# Patient Record
Sex: Male | Born: 1945 | Race: White | Hispanic: No | Marital: Married | Smoking: Former smoker
Health system: Southern US, Community
[De-identification: ages and names within clinical notes are randomized; demographics above are authoritative.]

## PROBLEM LIST (undated history)

## (undated) DIAGNOSIS — K219 Gastro-esophageal reflux disease without esophagitis: Secondary | ICD-10-CM

## (undated) DIAGNOSIS — R011 Cardiac murmur, unspecified: Secondary | ICD-10-CM

## (undated) DIAGNOSIS — Z8546 Personal history of malignant neoplasm of prostate: Secondary | ICD-10-CM

## (undated) DIAGNOSIS — I712 Thoracic aortic aneurysm, without rupture: Secondary | ICD-10-CM

## (undated) DIAGNOSIS — I350 Nonrheumatic aortic (valve) stenosis: Secondary | ICD-10-CM

## (undated) DIAGNOSIS — T4145XA Adverse effect of unspecified anesthetic, initial encounter: Secondary | ICD-10-CM

## (undated) DIAGNOSIS — R001 Bradycardia, unspecified: Secondary | ICD-10-CM

## (undated) DIAGNOSIS — I2510 Atherosclerotic heart disease of native coronary artery without angina pectoris: Secondary | ICD-10-CM

## (undated) DIAGNOSIS — R29898 Other symptoms and signs involving the musculoskeletal system: Secondary | ICD-10-CM

## (undated) DIAGNOSIS — N1831 Chronic kidney disease, stage 3a: Secondary | ICD-10-CM

## (undated) DIAGNOSIS — R0600 Dyspnea, unspecified: Secondary | ICD-10-CM

## (undated) DIAGNOSIS — I700 Atherosclerosis of aorta: Secondary | ICD-10-CM

## (undated) DIAGNOSIS — K760 Fatty (change of) liver, not elsewhere classified: Secondary | ICD-10-CM

## (undated) DIAGNOSIS — I10 Essential (primary) hypertension: Secondary | ICD-10-CM

## (undated) DIAGNOSIS — I7121 Aneurysm of the ascending aorta, without rupture: Secondary | ICD-10-CM

## (undated) DIAGNOSIS — Z8719 Personal history of other diseases of the digestive system: Secondary | ICD-10-CM

## (undated) DIAGNOSIS — Z87820 Personal history of traumatic brain injury: Secondary | ICD-10-CM

## (undated) DIAGNOSIS — Z974 Presence of external hearing-aid: Secondary | ICD-10-CM

## (undated) DIAGNOSIS — C801 Malignant (primary) neoplasm, unspecified: Secondary | ICD-10-CM

## (undated) DIAGNOSIS — G629 Polyneuropathy, unspecified: Secondary | ICD-10-CM

## (undated) DIAGNOSIS — Z972 Presence of dental prosthetic device (complete) (partial): Secondary | ICD-10-CM

## (undated) DIAGNOSIS — Z952 Presence of prosthetic heart valve: Secondary | ICD-10-CM

## (undated) DIAGNOSIS — M1990 Unspecified osteoarthritis, unspecified site: Secondary | ICD-10-CM

## (undated) DIAGNOSIS — M5126 Other intervertebral disc displacement, lumbar region: Secondary | ICD-10-CM

## (undated) DIAGNOSIS — I5189 Other ill-defined heart diseases: Secondary | ICD-10-CM

## (undated) DIAGNOSIS — M51369 Other intervertebral disc degeneration, lumbar region without mention of lumbar back pain or lower extremity pain: Secondary | ICD-10-CM

## (undated) DIAGNOSIS — M5136 Other intervertebral disc degeneration, lumbar region: Secondary | ICD-10-CM

## (undated) HISTORY — DX: Nonrheumatic aortic (valve) stenosis: I35.0

## (undated) HISTORY — PX: CARDIAC CATHETERIZATION: SHX172

## (undated) HISTORY — PX: TONSILLECTOMY: SUR1361

## (undated) HISTORY — DX: Gastro-esophageal reflux disease without esophagitis: K21.9

## (undated) HISTORY — PX: EYE SURGERY: SHX253

## (undated) HISTORY — DX: Unspecified osteoarthritis, unspecified site: M19.90

## (undated) HISTORY — PX: KNEE ARTHROSCOPY W/ MENISCAL REPAIR: SHX1877

## (undated) HISTORY — DX: Cardiac murmur, unspecified: R01.1

## (undated) HISTORY — PX: TONSILLECTOMY AND ADENOIDECTOMY: SHX28

## (undated) HISTORY — PX: COLONOSCOPY: SHX174

## (undated) HISTORY — DX: Polyneuropathy, unspecified: G62.9

## (undated) HISTORY — DX: Personal history of traumatic brain injury: Z87.820

---
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
## 2007-02-24 ENCOUNTER — Ambulatory Visit: Payer: Self-pay | Admitting: Orthopaedic Surgery

## 2007-03-22 ENCOUNTER — Ambulatory Visit: Payer: Self-pay | Admitting: Orthopaedic Surgery

## 2013-03-23 HISTORY — PX: CATARACT EXTRACTION: SUR2

## 2014-07-22 ENCOUNTER — Other Ambulatory Visit: Payer: Self-pay | Admitting: Family Medicine

## 2014-07-24 ENCOUNTER — Other Ambulatory Visit: Payer: Self-pay | Admitting: Family Medicine

## 2014-07-24 DIAGNOSIS — R011 Cardiac murmur, unspecified: Secondary | ICD-10-CM

## 2014-07-27 ENCOUNTER — Other Ambulatory Visit: Payer: Self-pay

## 2014-07-27 ENCOUNTER — Ambulatory Visit: Payer: BLUE CROSS/BLUE SHIELD | Attending: Family Medicine

## 2014-07-27 DIAGNOSIS — I350 Nonrheumatic aortic (valve) stenosis: Secondary | ICD-10-CM | POA: Diagnosis not present

## 2014-07-27 DIAGNOSIS — R011 Cardiac murmur, unspecified: Secondary | ICD-10-CM

## 2014-07-27 DIAGNOSIS — I38 Endocarditis, valve unspecified: Secondary | ICD-10-CM | POA: Diagnosis present

## 2015-03-21 DIAGNOSIS — G629 Polyneuropathy, unspecified: Secondary | ICD-10-CM | POA: Insufficient documentation

## 2015-03-21 DIAGNOSIS — I350 Nonrheumatic aortic (valve) stenosis: Secondary | ICD-10-CM | POA: Insufficient documentation

## 2015-03-21 DIAGNOSIS — R011 Cardiac murmur, unspecified: Secondary | ICD-10-CM | POA: Insufficient documentation

## 2015-03-27 ENCOUNTER — Encounter: Payer: Self-pay | Admitting: Family Medicine

## 2015-04-03 ENCOUNTER — Encounter: Payer: Self-pay | Admitting: Family Medicine

## 2015-04-03 ENCOUNTER — Ambulatory Visit (INDEPENDENT_AMBULATORY_CARE_PROVIDER_SITE_OTHER): Payer: BLUE CROSS/BLUE SHIELD | Admitting: Family Medicine

## 2015-04-03 VITALS — BP 125/57 | HR 63 | Temp 98.8°F | Ht 67.75 in | Wt 197.0 lb

## 2015-04-03 DIAGNOSIS — Z1159 Encounter for screening for other viral diseases: Secondary | ICD-10-CM | POA: Diagnosis not present

## 2015-04-03 DIAGNOSIS — Z0000 Encounter for general adult medical examination without abnormal findings: Secondary | ICD-10-CM | POA: Diagnosis not present

## 2015-04-03 DIAGNOSIS — Z1211 Encounter for screening for malignant neoplasm of colon: Secondary | ICD-10-CM | POA: Diagnosis not present

## 2015-04-03 DIAGNOSIS — I350 Nonrheumatic aortic (valve) stenosis: Secondary | ICD-10-CM | POA: Diagnosis not present

## 2015-04-03 DIAGNOSIS — R1901 Right upper quadrant abdominal swelling, mass and lump: Secondary | ICD-10-CM

## 2015-04-03 DIAGNOSIS — Z125 Encounter for screening for malignant neoplasm of prostate: Secondary | ICD-10-CM

## 2015-04-03 DIAGNOSIS — Z23 Encounter for immunization: Secondary | ICD-10-CM | POA: Diagnosis not present

## 2015-04-03 DIAGNOSIS — Z114 Encounter for screening for human immunodeficiency virus [HIV]: Secondary | ICD-10-CM

## 2015-04-03 DIAGNOSIS — Z136 Encounter for screening for cardiovascular disorders: Secondary | ICD-10-CM

## 2015-04-03 NOTE — Patient Instructions (Addendum)
Start back on 81 mg coated aspirin once a day for heart protection Limit eggs to no more than three per week Try to limit saturated fats in your diet (bologna, hot dogs, barbeque, cheeseburgers, hamburgers, steak, bacon, sausage, cheese, etc.) and get more fresh fruits, vegetables, and whole grains We'll have you see the cardiologist about your heart murmur We'll get a scan of your abdomen We'll get some blood tests today We'll get an ultrasound to rule-out an aneurysm (for men who have ever smoked) We'll refer you for a colonoscopy If you have not heard anything from my staff in a week about any orders/referrals/studies from today, please contact us here to follow-up [phone] Return in one year for your next physical or Medicare visit when due

## 2015-04-03 NOTE — Assessment & Plan Note (Signed)
Refer back to cardiologist 

## 2015-04-03 NOTE — Progress Notes (Signed)
Patient ID: BURT NAREZ, male   DOB: [birth year], 70 y.o.   MRN: [id number]  Subjective:   AMYR WASZAK is a 70 y.o. male here for a complete physical exam  Interim issues since last visit: he retired the 15th of February; goes on [place] in February, does not go into effect until February 15th Has knot in the abdomen; he never went to get the xray; bump has now moved from the xiphoid area to the RUQ; hurts when bending forward; no nausea; appetite is good; no change in stools, no blood in stool; no jaundice  USPSTF grade A and B recommendations Alcohol: one beer a week Depression:  Depression screen [facility] 2/9 04/03/2015  Decreased Interest 0  Down, Depressed, Hopeless 0  PHQ - 2 Score 0   Hypertension: controlled today Obesity: lost 6 pounds Tobacco use: smoked in the service, quit more than 25-30 years ago HIV, hep B, hep C: he is willing to get checked STD testing and prevention (chl/gon/syphilis): asymptomatic Lipids: check today Glucose: check today (last thing to eat or drink was 5:45 am), otherwise just water Colorectal cancer: last colonoscopy many years ago Breast cancer: no lumps in the breasts, not sure of any fam hx of breast cancer Lung cancer: quit smoking more than 25-30 years ago Osteoporosis: low risk AAA: ordered US Aspirin: doesn't take aspirin now; he got cut at work, nurse told him she couldn't stop it from bleeding and told him to cut back on that Diet: eats breakfast at country kitchen, eggs, sausage gravy, hash browns with onions, coffee, sliced tomatoes, toast Exercise: pretty active Skin cancer: moles on the neck, just came up, had to quit wearing chain around the neck  Past Medical History  Diagnosis Date  . Neuropathy ([place])   . Aortic stenosis   . Cardiac murmur   . Sleep apnea   . Reflux   . Arthritis   . History of concussion    Past Surgical History  Procedure Laterality Date  . Knee arthroscopy w/ meniscal repair Right   . Cataract  extraction Left 2015  . Tonsillectomy and adenoidectomy     Family History  Problem Relation Age of Onset  . Heart disease Mother   . Stroke Mother   . Diabetes Mother     high blood sugar  . Hypertension Mother   . Cancer Father   . Arthritis Father   . Cancer Brother     throat  . Diabetes Brother   . Lung disease Brother     asbestos   Social History  Substance Use Topics  . Smoking status: Never Smoker   . Smokeless tobacco: Never Used  . Alcohol Use: 0.6 oz/week    1 Cans of beer per week     Comment: 1 beer a week   Review of Systems  Constitutional: Negative for fever, chills and unexpected weight change.  Respiratory: Positive for shortness of breath (only if he exercises or runs a lot).    Objective:   Filed Vitals:   04/03/15 1356  BP: 125/57  Pulse: 63  Temp: 98.8 F (37.1 C)  Height: 5' 7.75" (1.721 m)  Weight: 197 lb (89.359 kg)  SpO2: 96%   Body mass index is 30.17 kg/(m^2). Wt Readings from Last 3 Encounters:  04/03/15 197 lb (89.359 kg)  08/15/14 203 lb (92.08 kg)   Physical Exam  Constitutional: He appears well-developed and well-nourished. No distress.  Obese  HENT:  Head: Normocephalic and atraumatic.  Nose: Nose normal.  Mouth/Throat: Oropharynx is clear and moist.  Eyes: EOM are normal. No scleral icterus.  Neck: No JVD present. Carotid bruit is not present. No thyromegaly present.  Cardiovascular: Normal rate and regular rhythm.  Exam reveals no gallop.   Murmur heard.  Systolic murmur is present with a grade of 4/6  Murmur heard loudest over RUSB  Pulmonary/Chest: Effort normal and breath sounds normal. No respiratory distress. He has no wheezes. He has no rales. He exhibits deformity (barrel-chested (from years of weight-lifting, says pt)). He exhibits no tenderness.  Abdominal: Soft. Bowel sounds are normal. He exhibits mass (protrusion over right upper quadrant, not consistent with xiphoid process). He exhibits no distension.  There is no tenderness. There is no guarding.  Musculoskeletal: Normal range of motion. He exhibits no edema.  Lymphadenopathy:    He has no cervical adenopathy.  Neurological: He is alert. He displays normal reflexes. He exhibits normal muscle tone. Coordination normal.  Skin: Skin is warm and dry. No rash noted. He is not diaphoretic. No erythema. No pallor.  Psychiatric: He has a normal mood and affect. His behavior is normal. Judgment and thought content normal. Cognition and memory are normal.    Assessment/Plan:   Problem List Items Addressed This Visit      Cardiovascular and Mediastinum   Aortic stenosis    Refer back to cardiologist      Relevant Orders   Ambulatory referral to Cardiology     Other   Colon cancer screening    Refer for colonoscopy      Relevant Orders   Ambulatory referral to Gastroenterology   Screening for AAA (abdominal aortic aneurysm)    Order one-time [place] to evaluate for AAA      Relevant Orders   [place] [employer]   Preventative health care - Primary    USPSTF grade A and B recommendations reviewed with patient; age-appropriate recommendations, preventive care, screening tests, etc discussed and encouraged; healthy living encouraged; see AVS for patient education given to patient      Relevant Orders   Lipid Panel w/o Chol/HDL Ratio (Completed)   Comprehensive metabolic panel (Completed)    Other Visit Diagnoses    Needs flu shot        flu vaccine given    Encounter for immunization        Need for hepatitis C screening test        one-time hep C screen ordered    Relevant Orders    Hepatitis C antibody (Completed)    Encounter for screening for HIV        one-time HIV screen ordered    Relevant Orders    HIV antibody (Completed)    Abdominal wall mass of right upper quadrant        CT scan of abdomen; some deformity of thorax (from years of weight-lifting says pt), but this is new    Relevant Orders    CT Abdomen W  Contrast    Prostate cancer screening        discussed controversy, USPSTF vs AUA screening; pt opted for PSA    Relevant Orders    PSA (Completed)       Meds ordered this encounter  Medications  . vitamin B-12 (CYANOCOBALAMIN) 1000 MCG tablet    Sig: Take 1,000 mcg by mouth daily.   Follow up plan: Return in about 1 year (around 04/02/2016) for complete physical.  An after-visit summary was printed and given to  the patient at [place].  Please see the patient instructions which may contain other information and recommendations beyond what is mentioned above in the assessment and plan.  Orders Placed This Encounter  Procedures  . [place] [facility]  . CT Abdomen W Contrast  . Flu Vaccine QUAD 36+ mos IM  . HIV antibody  . Hepatitis C antibody  . Lipid Panel w/o Chol/HDL Ratio  . Comprehensive metabolic panel  . PSA  . Ambulatory referral to Cardiology  . Ambulatory referral to Gastroenterology

## 2015-04-04 LAB — COMPREHENSIVE METABOLIC PANEL
ALBUMIN: 4.1 g/dL (ref 3.5–4.8)
ALT: 13 IU/L (ref 0–44)
AST: 21 IU/L (ref 0–40)
Albumin/Globulin Ratio: 1.6 (ref 1.1–2.5)
Alkaline Phosphatase: 64 IU/L (ref 39–117)
BILIRUBIN TOTAL: 0.4 mg/dL (ref 0.0–1.2)
BUN / CREAT RATIO: 10 (ref 10–22)
BUN: 13 mg/dL (ref 8–27)
CO2: 26 mmol/L (ref 18–29)
CREATININE: 1.27 mg/dL (ref 0.76–1.27)
Calcium: 9.3 mg/dL (ref 8.6–10.2)
Chloride: 104 mmol/L (ref 96–106)
GFR calc non Af Amer: 57 mL/min/{1.73_m2} — ABNORMAL LOW (ref >59)
GFR, EST AFRICAN AMERICAN: 66 mL/min/{1.73_m2} (ref >59)
GLOBULIN, TOTAL: 2.6 g/dL (ref 1.5–4.5)
Glucose: 92 mg/dL (ref 65–99)
Potassium: 4.7 mmol/L (ref 3.5–5.2)
SODIUM: 145 mmol/L — AB (ref 134–144)
TOTAL PROTEIN: 6.7 g/dL (ref 6.0–8.5)

## 2015-04-04 LAB — LIPID PANEL W/O CHOL/HDL RATIO
CHOLESTEROL TOTAL: 185 mg/dL (ref 100–199)
HDL: 52 mg/dL (ref >39)
LDL CALC: 120 mg/dL — AB (ref 0–99)
TRIGLYCERIDES: 67 mg/dL (ref 0–149)
VLDL Cholesterol Cal: 13 mg/dL (ref 5–40)

## 2015-04-04 LAB — PSA: Prostate Specific Ag, Serum: 5.1 ng/mL — ABNORMAL HIGH (ref 0.0–4.0)

## 2015-04-04 LAB — HEPATITIS C ANTIBODY: Hep C Virus Ab: <0.1 s/co ratio (ref 0.0–0.9)

## 2015-04-04 LAB — HIV ANTIBODY (ROUTINE TESTING W REFLEX): HIV Screen 4th Generation wRfx: NONREACTIVE

## 2015-04-06 ENCOUNTER — Telehealth: Payer: Self-pay | Admitting: Family Medicine

## 2015-04-06 DIAGNOSIS — E7800 Pure hypercholesterolemia, unspecified: Secondary | ICD-10-CM | POA: Insufficient documentation

## 2015-04-06 DIAGNOSIS — R972 Elevated prostate specific antigen [PSA]: Secondary | ICD-10-CM | POA: Insufficient documentation

## 2015-04-06 NOTE — Telephone Encounter (Signed)
I talked with patient He'll try more oatmeal instead of sausage and gravy and eggs Recheck lipids in 3 months PSA is elevated; he'd like to go in mid-February, missed work already because of snow, so would like to wait a month, after 15th Feb

## 2015-04-08 ENCOUNTER — Telehealth: Payer: Self-pay | Admitting: Family Medicine

## 2015-04-08 DIAGNOSIS — Z136 Encounter for screening for cardiovascular disorders: Secondary | ICD-10-CM | POA: Insufficient documentation

## 2015-04-08 NOTE — Telephone Encounter (Signed)
done

## 2015-04-08 NOTE — Telephone Encounter (Signed)
-----   Message from Kenyon Ana sent at 04/08/2015 11:40 AM EST ----- Regarding: please correct order Please correct order for the intial medicare screening since patient has no other imaging studies about the aorta and they are male between 33-75 and has smoked in there lifetime.  [id number]) US aorta initial medicare screen.  Please make sure you keep that he has smoked many years ago on the order. Thank you and have a great day.

## 2015-04-09 DIAGNOSIS — Z0000 Encounter for general adult medical examination without abnormal findings: Secondary | ICD-10-CM | POA: Insufficient documentation

## 2015-04-09 NOTE — Assessment & Plan Note (Signed)
USPSTF grade A and B recommendations reviewed with patient; age-appropriate recommendations, preventive care, screening tests, etc discussed and encouraged; healthy living encouraged; see AVS for patient education given to patient  

## 2015-04-09 NOTE — Assessment & Plan Note (Signed)
Order one-time [place] to evaluate for AAA

## 2015-04-09 NOTE — Assessment & Plan Note (Signed)
Refer for colonoscopy 

## 2015-04-18 ENCOUNTER — Other Ambulatory Visit: Payer: Self-pay | Admitting: Family Medicine

## 2015-04-18 DIAGNOSIS — Z87891 Personal history of nicotine dependence: Secondary | ICD-10-CM

## 2015-04-18 DIAGNOSIS — Z136 Encounter for screening for cardiovascular disorders: Secondary | ICD-10-CM

## 2015-04-19 ENCOUNTER — Ambulatory Visit
Admission: RE | Admit: 2015-04-19 | Discharge: 2015-04-19 | Disposition: A | Discharge status: Home / Self Care | Payer: BLUE CROSS/BLUE SHIELD | Source: Ambulatory Visit | Attending: Family Medicine | Admitting: Family Medicine

## 2015-04-19 ENCOUNTER — Telehealth: Payer: Self-pay | Admitting: Family Medicine

## 2015-04-19 DIAGNOSIS — I517 Cardiomegaly: Secondary | ICD-10-CM | POA: Insufficient documentation

## 2015-04-19 DIAGNOSIS — S32030S Wedge compression fracture of third lumbar vertebra, sequela: Secondary | ICD-10-CM

## 2015-04-19 DIAGNOSIS — Z720 Tobacco use: Secondary | ICD-10-CM | POA: Insufficient documentation

## 2015-04-19 DIAGNOSIS — Z136 Encounter for screening for cardiovascular disorders: Secondary | ICD-10-CM | POA: Insufficient documentation

## 2015-04-19 DIAGNOSIS — I700 Atherosclerosis of aorta: Secondary | ICD-10-CM | POA: Insufficient documentation

## 2015-04-19 DIAGNOSIS — I77811 Abdominal aortic ectasia: Secondary | ICD-10-CM

## 2015-04-19 DIAGNOSIS — I2510 Atherosclerotic heart disease of native coronary artery without angina pectoris: Secondary | ICD-10-CM | POA: Diagnosis not present

## 2015-04-19 DIAGNOSIS — R1901 Right upper quadrant abdominal swelling, mass and lump: Secondary | ICD-10-CM | POA: Insufficient documentation

## 2015-04-19 DIAGNOSIS — K760 Fatty (change of) liver, not elsewhere classified: Secondary | ICD-10-CM | POA: Insufficient documentation

## 2015-04-19 DIAGNOSIS — N281 Cyst of kidney, acquired: Secondary | ICD-10-CM | POA: Insufficient documentation

## 2015-04-19 DIAGNOSIS — M4856XA Collapsed vertebra, not elsewhere classified, lumbar region, initial encounter for fracture: Secondary | ICD-10-CM | POA: Diagnosis not present

## 2015-04-19 DIAGNOSIS — I350 Nonrheumatic aortic (valve) stenosis: Secondary | ICD-10-CM

## 2015-04-19 DIAGNOSIS — M51369 Other intervertebral disc degeneration, lumbar region without mention of lumbar back pain or lower extremity pain: Secondary | ICD-10-CM

## 2015-04-19 DIAGNOSIS — M5126 Other intervertebral disc displacement, lumbar region: Secondary | ICD-10-CM

## 2015-04-19 DIAGNOSIS — R972 Elevated prostate specific antigen [PSA]: Secondary | ICD-10-CM

## 2015-04-19 DIAGNOSIS — M5136 Other intervertebral disc degeneration, lumbar region: Secondary | ICD-10-CM

## 2015-04-19 MED ORDER — IOHEXOL 300 MG/ML  SOLN
100.0000 mL | Freq: Once | INTRAMUSCULAR | Status: AC | PRN
  Administered 2015-04-19: 100 mL via INTRAVENOUS

## 2015-04-19 MED ORDER — ATORVASTATIN CALCIUM 20 MG PO TABS: 20.0000 mg | ORAL_TABLET | Freq: Every day | ORAL | Status: DC

## 2015-04-19 NOTE — Telephone Encounter (Signed)
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

## 2015-04-19 NOTE — Telephone Encounter (Signed)
Abd [place] and CT scan reports are back; call with results

## 2015-04-20 ENCOUNTER — Telehealth: Payer: Self-pay | Admitting: Family Medicine

## 2015-04-20 DIAGNOSIS — K760 Fatty (change of) liver, not elsewhere classified: Secondary | ICD-10-CM | POA: Insufficient documentation

## 2015-04-20 DIAGNOSIS — I517 Cardiomegaly: Secondary | ICD-10-CM | POA: Insufficient documentation

## 2015-04-20 DIAGNOSIS — I2510 Atherosclerotic heart disease of native coronary artery without angina pectoris: Secondary | ICD-10-CM | POA: Insufficient documentation

## 2015-04-20 DIAGNOSIS — M5126 Other intervertebral disc displacement, lumbar region: Secondary | ICD-10-CM | POA: Insufficient documentation

## 2015-04-20 DIAGNOSIS — I77811 Abdominal aortic ectasia: Secondary | ICD-10-CM | POA: Insufficient documentation

## 2015-04-20 DIAGNOSIS — N281 Cyst of kidney, acquired: Secondary | ICD-10-CM | POA: Insufficient documentation

## 2015-04-20 DIAGNOSIS — S32030A Wedge compression fracture of third lumbar vertebra, initial encounter for closed fracture: Secondary | ICD-10-CM | POA: Insufficient documentation

## 2015-04-20 DIAGNOSIS — M5136 Other intervertebral disc degeneration, lumbar region: Secondary | ICD-10-CM | POA: Insufficient documentation

## 2015-04-20 NOTE — Telephone Encounter (Signed)
New information for cardiology referral Please let cardiologist's office know that he also has coronary artery disease noted in RCA, would appreciate they evaluate that as well as the aortic stenosis Thank you

## 2015-04-20 NOTE — Telephone Encounter (Signed)
Update problem list with the following: Aortic US IMPRESSION: Mild prominence of the proximal abdominal aorta without aneurysm. The aorta tapers distally. Ectatic abdominal aorta at risk for aneurysm development. Recommend followup by ultrasound in 5 years. CT scan 1. The only candidate area for the palpable right upper quadrant lump is mild asymmetric prominence of the right lower serratus anterior muscle. 2. 50% central compression fracture at L3 without bony retropulsion, appears chronic. 3. Suspected central narrowing of the thecal sac at L4-5 due to disc bulge and ligamentum flavum redundancy. 4. Mild cardiomegaly. Aortic valve calcification and right coronary artery atherosclerotic calcification. 5. Diffuse hepatic steatosis. 6. Bilateral renal cysts.

## 2015-04-24 ENCOUNTER — Telehealth: Payer: Self-pay

## 2015-04-24 ENCOUNTER — Other Ambulatory Visit: Payer: Self-pay

## 2015-04-24 NOTE — Telephone Encounter (Signed)
Gastroenterology Pre-Procedure Review  Request Date: 05/17/15 Requesting Physician: Dr. Sanda Klein  PATIENT REVIEW QUESTIONS: The patient responded to the following health history questions as indicated:    1. Are you having any GI issues? no 2. Do you have a personal history of Polyps? no 3. Do you have a family history of Colon Cancer or Polyps? no 4. Diabetes Mellitus? no 5. Joint replacements in the past 12 months?no 6. Major health problems in the past 3 months?no 7. Any artificial heart valves, MVP, or defibrillator?no    MEDICATIONS & ALLERGIES:    Patient reports the following regarding taking any anticoagulation/antiplatelet therapy:   Plavix, Coumadin, Eliquis, Xarelto, Lovenox, Pradaxa, Brilinta, or Effient? no Aspirin? no  Patient confirms/reports the following medications:  Current Outpatient Prescriptions  Medication Sig Dispense Refill  . atorvastatin (LIPITOR) 20 MG tablet Take 1 tablet (20 mg total) by mouth at bedtime. 30 tablet 1  . vitamin B-12 (CYANOCOBALAMIN) 1000 MCG tablet Take 1,000 mcg by mouth daily.     No current facility-administered medications for this visit.    Patient confirms/reports the following allergies:  Allergies  Allergen Reactions  . Vicodin [Hydrocodone-Acetaminophen]     No orders of the defined types were placed in this encounter.    AUTHORIZATION INFORMATION Primary Insurance: 1D#: Group #:  Secondary Insurance: 1D#: Group #:  SCHEDULE INFORMATION: Date: 05/17/15 Time: Location: [place]

## 2015-04-26 NOTE — Telephone Encounter (Signed)
Keri, If you could please see the note below, address and document, I would appreciate it Thank you

## 2015-05-02 NOTE — Telephone Encounter (Signed)
In-basket messaged Dr. Rogue Jury regarding new information on Diagnosis. Dr.Muhammad is apart of [facility]. Patient's appointment is 06/04/2015

## 2015-05-10 ENCOUNTER — Encounter: Payer: Self-pay | Admitting: *Deleted

## 2015-05-16 NOTE — Discharge Instructions (Signed)

## 2015-05-17 ENCOUNTER — Ambulatory Visit: Payer: Medicare HMO | Admitting: Anesthesiology

## 2015-05-17 ENCOUNTER — Ambulatory Visit
Admission: RE | Admit: 2015-05-17 | Discharge: 2015-05-17 | Disposition: A | Discharge status: Home / Self Care | Payer: Medicare HMO | Source: Ambulatory Visit | Attending: Gastroenterology | Admitting: Gastroenterology

## 2015-05-17 ENCOUNTER — Encounter
Admission: RE | Disposition: A | Discharge status: Home / Self Care | Payer: Self-pay | Source: Ambulatory Visit | Attending: Gastroenterology

## 2015-05-17 DIAGNOSIS — K219 Gastro-esophageal reflux disease without esophagitis: Secondary | ICD-10-CM | POA: Insufficient documentation

## 2015-05-17 DIAGNOSIS — M19042 Primary osteoarthritis, left hand: Secondary | ICD-10-CM | POA: Diagnosis not present

## 2015-05-17 DIAGNOSIS — M1990 Unspecified osteoarthritis, unspecified site: Secondary | ICD-10-CM | POA: Insufficient documentation

## 2015-05-17 DIAGNOSIS — Z8261 Family history of arthritis: Secondary | ICD-10-CM | POA: Insufficient documentation

## 2015-05-17 DIAGNOSIS — R008 Other abnormalities of heart beat: Secondary | ICD-10-CM | POA: Insufficient documentation

## 2015-05-17 DIAGNOSIS — Z79899 Other long term (current) drug therapy: Secondary | ICD-10-CM | POA: Diagnosis not present

## 2015-05-17 DIAGNOSIS — Z87891 Personal history of nicotine dependence: Secondary | ICD-10-CM | POA: Insufficient documentation

## 2015-05-17 DIAGNOSIS — Z1211 Encounter for screening for malignant neoplasm of colon: Secondary | ICD-10-CM | POA: Diagnosis present

## 2015-05-17 DIAGNOSIS — E538 Deficiency of other specified B group vitamins: Secondary | ICD-10-CM | POA: Insufficient documentation

## 2015-05-17 DIAGNOSIS — M170 Bilateral primary osteoarthritis of knee: Secondary | ICD-10-CM | POA: Insufficient documentation

## 2015-05-17 DIAGNOSIS — Z9889 Other specified postprocedural states: Secondary | ICD-10-CM | POA: Diagnosis not present

## 2015-05-17 DIAGNOSIS — R001 Bradycardia, unspecified: Secondary | ICD-10-CM | POA: Diagnosis not present

## 2015-05-17 DIAGNOSIS — I493 Ventricular premature depolarization: Secondary | ICD-10-CM | POA: Insufficient documentation

## 2015-05-17 DIAGNOSIS — M19041 Primary osteoarthritis, right hand: Secondary | ICD-10-CM | POA: Insufficient documentation

## 2015-05-17 DIAGNOSIS — Z538 Procedure and treatment not carried out for other reasons: Secondary | ICD-10-CM | POA: Insufficient documentation

## 2015-05-17 DIAGNOSIS — I350 Nonrheumatic aortic (valve) stenosis: Secondary | ICD-10-CM | POA: Insufficient documentation

## 2015-05-17 DIAGNOSIS — Z8249 Family history of ischemic heart disease and other diseases of the circulatory system: Secondary | ICD-10-CM | POA: Diagnosis not present

## 2015-05-17 DIAGNOSIS — G4730 Sleep apnea, unspecified: Secondary | ICD-10-CM | POA: Diagnosis not present

## 2015-05-17 DIAGNOSIS — Z7982 Long term (current) use of aspirin: Secondary | ICD-10-CM | POA: Insufficient documentation

## 2015-05-17 DIAGNOSIS — Z800 Family history of malignant neoplasm of digestive organs: Secondary | ICD-10-CM | POA: Insufficient documentation

## 2015-05-17 DIAGNOSIS — Z9842 Cataract extraction status, left eye: Secondary | ICD-10-CM | POA: Insufficient documentation

## 2015-05-17 DIAGNOSIS — Z885 Allergy status to narcotic agent status: Secondary | ICD-10-CM | POA: Diagnosis not present

## 2015-05-17 DIAGNOSIS — Z825 Family history of asthma and other chronic lower respiratory diseases: Secondary | ICD-10-CM | POA: Diagnosis not present

## 2015-05-17 DIAGNOSIS — Z833 Family history of diabetes mellitus: Secondary | ICD-10-CM | POA: Diagnosis not present

## 2015-05-17 DIAGNOSIS — Z823 Family history of stroke: Secondary | ICD-10-CM | POA: Diagnosis not present

## 2015-05-17 HISTORY — DX: Other symptoms and signs involving the musculoskeletal system: R29.898

## 2015-05-17 HISTORY — DX: Presence of dental prosthetic device (complete) (partial): Z97.2

## 2015-05-17 HISTORY — DX: Other intervertebral disc degeneration, lumbar region: M51.36

## 2015-05-17 HISTORY — DX: Other intervertebral disc displacement, lumbar region: M51.26

## 2015-05-17 HISTORY — DX: Other intervertebral disc degeneration, lumbar region without mention of lumbar back pain or lower extremity pain: M51.369

## 2015-05-17 HISTORY — PX: COLONOSCOPY WITH PROPOFOL: SHX5780

## 2015-05-17 SURGERY — COLONOSCOPY WITH PROPOFOL
Anesthesia: Monitor Anesthesia Care

## 2015-05-17 MED ORDER — FENTANYL CITRATE (PF) 100 MCG/2ML IJ SOLN: 25.0000 ug | INTRAMUSCULAR | Status: DC | PRN

## 2015-05-17 MED ORDER — LACTATED RINGERS IV SOLN
INTRAVENOUS | Status: DC
  Administered 2015-05-17: 10:00:00 via INTRAVENOUS

## 2015-05-17 SURGICAL SUPPLY — 28 items

## 2015-05-17 NOTE — Anesthesia Preprocedure Evaluation (Signed)
Anesthesia Evaluation  Patient identified by MRN, date of birth, ID band Patient awake    Reviewed: Allergy & Precautions, NPO status , Patient's Chart, lab work & pertinent test results  Airway Mallampati: II  TM Distance: >3 FB Neck ROM: Full    Dental no notable dental hx. (+) Edentulous Upper, Edentulous Lower   Pulmonary neg pulmonary ROS, sleep apnea , former smoker,    Pulmonary exam normal breath sounds clear to auscultation       Cardiovascular Exercise Tolerance: Good METS: Normal cardiovascular exam Rhythm:Regular Rate:Normal  Pt is able to climb trees, work hard in the yard, hunt.  Pt has moderate aortic stenosis, but excellent exercise tolerance.   Neuro/Psych negative neurological ROS  negative psych ROS   GI/Hepatic negative GI ROS, Neg liver ROS,   Endo/Other  negative endocrine ROS  Renal/GU negative Renal ROS  negative genitourinary   Musculoskeletal negative musculoskeletal ROS (+) Arthritis ,   Abdominal   Peds negative pediatric ROS (+)  Hematology negative hematology ROS (+)   Anesthesia Other Findings   Reproductive/Obstetrics negative OB ROS                             Anesthesia Physical Anesthesia Plan  ASA: II  Anesthesia Plan: MAC   Post-op Pain Management:    Induction: Intravenous  Airway Management Planned:   Additional Equipment:   Intra-op Plan:   Post-operative Plan: Extubation in OR  Informed Consent: I have reviewed the patients History and Physical, chart, labs and discussed the procedure including the risks, benefits and alternatives for the proposed anesthesia with the patient or authorized representative who has indicated his/her understanding and acceptance.   Dental advisory given  Plan Discussed with: CRNA  Anesthesia Plan Comments:         Anesthesia Quick Evaluation

## 2015-05-17 NOTE — Transfer of Care (Signed)
Immediate Anesthesia Transfer of Care Note  Patient: Gregory Black  Procedure(s) Performed: Procedure(s): COLONOSCOPY WITH PROPOFOL (N/A)  Patient Location: PACU  Anesthesia Type: MAC  Level of Consciousness: awake, alert  and patient cooperative  Airway and Oxygen Therapy: Patient Spontanous Breathing and Patient connected to supplemental oxygen  Post-op Assessment: Post-op Vital signs reviewed, Patient's Cardiovascular Status Stable, Respiratory Function Stable, Patent Airway and No signs of Nausea or vomiting  Post-op Vital Signs: Reviewed and stable  Complications: No apparent anesthesia complications

## 2015-05-17 NOTE — Anesthesia Postprocedure Evaluation (Signed)
Anesthesia Post Note  Patient: Gregory Black  Procedure(s) Performed: Procedure(s) (LRB): COLONOSCOPY WITH PROPOFOL (N/A)  Patient location during evaluation: PACU Anesthesia Type: General Level of consciousness: awake and alert Pain management: pain level controlled Vital Signs Assessment: post-procedure vital signs reviewed and stable Respiratory status: spontaneous breathing, nonlabored ventilation, respiratory function stable and patient connected to nasal cannula oxygen Cardiovascular status: blood pressure returned to baseline and stable Postop Assessment: no signs of nausea or vomiting Anesthetic complications: no Comments: Pt with known moderate Aortic stenosis and good functional capacity.  However, upon placement in lateral position, there were frequent PVCs, bigeminy, and severe bradycardia with P waves for each complex.  There were significant pauses.  The lowest heart 40's.  Pt was placed on his back and is currently in sinus rhythm with heart rate approximately 60.  The patient feels fine, "no different that normal."  Given the arrhythmia present and the aortic stenosis, we Dr. Allen Norris and I elected to cancel the case.  The patient received no medication.  The patient is doing well and does not require admission to a hospital since he is in his normal condition.  He does have a cardiology appointment upcoming which he will attend.     Salvatrice Morandi C

## 2015-05-17 NOTE — Anesthesia Procedure Notes (Signed)
Procedure Name: MAC Performed by: Kyrra Prada Pre-anesthesia Checklist: Patient identified, Emergency Drugs available, Suction available, Timeout performed and Patient being monitored Patient Re-evaluated:Patient Re-evaluated prior to inductionOxygen Delivery Method: Nasal cannula Placement Confirmation: positive ETCO2     

## 2015-05-17 NOTE — H&P (Signed)
[facility]  [address] [place], [place] [address] Phone: [phone] Fax : [phone]  Primary Care Physician:  Enid Derry, MD Primary Gastroenterologist:  Dr. Allen Norris  Pre-Procedure History & Physical: HPI:  Gregory Black is a 70 y.o. male is here for a screening colonoscopy.   Past Medical History  Diagnosis Date  . Neuropathy ([place])   . Aortic stenosis 07/27/14    Echo   . Cardiac murmur   . Reflux     in past  . History of concussion   . Sleep apnea     pt denies  . Bulging lumbar disc   . Arthritis     hands, knees  . Weakness of both legs     pt says from Vit b12 deficiency  . Wears dentures     full upper and lower    Past Surgical History  Procedure Laterality Date  . Knee arthroscopy w/ meniscal repair Right   . Cataract extraction Left 2015  . Tonsillectomy and adenoidectomy    . Colonoscopy      Prior to Admission medications   Medication Sig Start Date End Date Taking? Authorizing Provider  aspirin 81 MG tablet Take 81 mg by mouth daily.   Yes Historical Provider, MD  atorvastatin (LIPITOR) 20 MG tablet Take 1 tablet (20 mg total) by mouth at bedtime. 04/19/15  Yes Arnetha Courser, MD  vitamin B-12 (CYANOCOBALAMIN) 1000 MCG tablet Take 1,000 mcg by mouth daily.   Yes Historical Provider, MD    Allergies as of 04/24/2015 - Review Complete 04/19/2015  Allergen Reaction Noted  . Vicodin [hydrocodone-acetaminophen]  03/21/2015    Family History  Problem Relation Age of Onset  . Heart disease Mother   . Stroke Mother   . Diabetes Mother     high blood sugar  . Hypertension Mother   . Cancer Father   . Arthritis Father   . Cancer Brother     throat  . Diabetes Brother   . Lung disease Brother     asbestos    Social History   Social History  . Marital Status: Married    Spouse Name: N/A  . Number of Children: N/A  . Years of Education: N/A   Occupational History  . Not on file.   Social History Main Topics    . Smoking status: Former Research scientist (life sciences)  . Smokeless tobacco: Never Used     Comment: quit approx 2010  . Alcohol Use: 0.6 oz/week    1 Cans of beer per week     Comment: 1 beer a week  . Drug Use: No  . Sexual Activity: Not on file   Other Topics Concern  . Not on file   Social History Narrative    Review of Systems: See HPI, otherwise negative ROS  Physical Exam: BP 157/84 mmHg  Pulse 54  Temp(Src) 97.7 F (36.5 C)  Resp 16  Ht 5\' 7"  (1.702 m)  Wt 194 lb (87.998 kg)  BMI 30.38 kg/m2  SpO2 100% General:   Alert,  pleasant and cooperative in NAD Head:  Normocephalic and atraumatic. Neck:  Supple; no masses or thyromegaly. Lungs:  Clear throughout to auscultation.    Heart:  Regular rate and rhythm. Abdomen:  Soft, nontender and nondistended. Normal bowel sounds, without guarding, and without rebound.   Neurologic:  Alert and  oriented x4;  grossly normal neurologically.  Impression/Plan: [name] is now here to undergo a screening colonoscopy.  Risks, benefits, and alternatives regarding colonoscopy have been reviewed with the patient.  Questions have been answered.  All parties agreeable.

## 2015-05-20 ENCOUNTER — Encounter: Payer: Self-pay | Admitting: Gastroenterology

## 2015-05-20 ENCOUNTER — Telehealth: Payer: Self-pay | Admitting: Family Medicine

## 2015-05-20 DIAGNOSIS — R001 Bradycardia, unspecified: Secondary | ICD-10-CM

## 2015-05-20 NOTE — Telephone Encounter (Signed)
He was supposed to have his colonoscopy on Friday but they didn't do it because while attempting to sedate him, he was having low heart rate issues, skipping beats and high BP, so they did not feel comfortable putting him to sleep. GI wants him to see a cardiologist to get clearance before they repeat his colonoscopy. He has an appointment with Dr.Arida, but he wants to go to Dr. Ubaldo Glassing instead.

## 2015-05-20 NOTE — Telephone Encounter (Signed)
Pt has medical questions he didn't want to discuss with me. The only thing he told me is that he needed to speak with Dr Sanda Klein.

## 2015-05-20 NOTE — Telephone Encounter (Signed)
Referral put in now for Dr. Ubaldo Glassing. If we can move forward with getting him set for this that would be great. Thanks!

## 2015-06-03 ENCOUNTER — Telehealth: Payer: Self-pay | Admitting: Family Medicine

## 2015-06-03 NOTE — Telephone Encounter (Signed)
I spoke with patient. He was confused as to why [facility] was calling him in regards to an appointment. I advised him to disreard it, that was from before we knew he wanted to see Dr. Ubaldo Glassing. Patient understood.

## 2015-06-03 NOTE — Telephone Encounter (Signed)
Pt came in stated he has a few medical questions he needs to ask you. Please call pt ASAP. Pt stated it pertains to appt's he has tomorrow. Thanks.

## 2015-06-04 ENCOUNTER — Ambulatory Visit: Payer: BLUE CROSS/BLUE SHIELD | Admitting: Cardiovascular Disease

## 2016-06-03 ENCOUNTER — Other Ambulatory Visit: Payer: Self-pay

## 2016-06-10 ENCOUNTER — Ambulatory Visit: Payer: Medicare HMO | Admitting: Urology

## 2016-06-10 ENCOUNTER — Encounter: Payer: Self-pay | Admitting: Urology

## 2016-06-10 VITALS — BP 174/100 | HR 54 | Ht 65.0 in | Wt 206.3 lb

## 2016-06-10 DIAGNOSIS — R972 Elevated prostate specific antigen [PSA]: Secondary | ICD-10-CM

## 2016-06-10 DIAGNOSIS — N400 Enlarged prostate without lower urinary tract symptoms: Secondary | ICD-10-CM

## 2016-06-10 NOTE — Progress Notes (Signed)
06/10/2016 11:32 AM   Gregory Black [birth year] [id number]  Referring provider: Arnetha Courser, MD [address] [place], [place] [address]  Chief Complaint  Patient presents with  . New Patient (Initial Visit)    elevated PSA    HPI: The patient is a 71 year old gentleman who presents today for evaluation of the PSA of 6.75 which has risen from 5.1 over the last year. The patient does not know what his PSA was prior to his levels. He has no history of prostate biopsy. He has daytime frequency sometimes once per hour, however he is not bothered by this. He has nocturia 2 that also various which again is not bothered by this. He has no history of nephrolithiasis, UTI, or hematuria.  PSA History: 6.75 3/18 5.1   1/17     PMH: Past Medical History:  Diagnosis Date  . Aortic stenosis 07/27/14   Echo   . Arthritis    hands, knees  . Bulging lumbar disc   . Cardiac murmur   . History of concussion   . Neuropathy ([place])   . Reflux    in past  . Sleep apnea    pt denies  . Weakness of both legs    pt says from Vit b12 deficiency  . Wears dentures    full upper and lower    Surgical History: Past Surgical History:  Procedure Laterality Date  . CATARACT EXTRACTION Left 2015  . COLONOSCOPY    . COLONOSCOPY WITH PROPOFOL N/A 05/17/2015   Procedure: COLONOSCOPY WITH PROPOFOL;  Surgeon: Lucilla Lame, MD;  Location: [facility];  Service: Endoscopy;  Laterality: N/A;  . KNEE ARTHROSCOPY W/ MENISCAL REPAIR Right   . TONSILLECTOMY AND ADENOIDECTOMY      Home Medications:  Allergies as of 06/10/2016      Reactions   Pollen Extract    Vicodin [hydrocodone-acetaminophen]    Other reaction(s): Other (See Comments) Unable to void or empty bowels Unable to void or empty bowels      Medication List       Accurate as of 06/10/16 11:32 AM. Always use your most recent med list.          aspirin 81 MG tablet Take 81 mg by mouth daily.   Fish Oil 500 MG  Caps Take by mouth.   multivitamin tablet Take 1 tablet by mouth daily.   vitamin B-12 1000 MCG tablet Commonly known as:  CYANOCOBALAMIN Take 1,000 mcg by mouth daily.       Allergies:  Allergies  Allergen Reactions  . Pollen Extract   . Vicodin [Hydrocodone-Acetaminophen]     Other reaction(s): Other (See Comments) Unable to void or empty bowels Unable to void or empty bowels    Family History: Family History  Problem Relation Age of Onset  . Heart disease Mother   . Stroke Mother   . Diabetes Mother     high blood sugar  . Hypertension Mother   . Cancer Father   . Arthritis Father   . Cancer Brother     throat  . Diabetes Brother   . Lung disease Brother     asbestos    Social History:  reports that he has quit smoking. He has never used smokeless tobacco. He reports that he drinks about 0.6 oz of alcohol per week . He reports that he does not use drugs.  ROS: UROLOGY Frequent Urination?: Yes Hard to postpone urination?: Yes Burning/pain with urination?: No  Get up at night to urinate?: Yes Leakage of urine?: No Urine stream starts and stops?: No Trouble starting stream?: No Do you have to strain to urinate?: No Blood in urine?: No Urinary tract infection?: No Sexually transmitted disease?: No Injury to kidneys or bladder?: No Painful intercourse?: No Weak stream?: No Erection problems?: Yes Penile pain?: No  Gastrointestinal Nausea?: No Vomiting?: No Indigestion/heartburn?: No Diarrhea?: No Constipation?: No  Constitutional Fever: No Night sweats?: No Weight loss?: No Fatigue?: Yes  Skin Skin rash/lesions?: No Itching?: Yes  Eyes Blurred vision?: Yes Double vision?: No  Ears/Nose/Throat Sore throat?: No Sinus problems?: Yes  Hematologic/Lymphatic Swollen glands?: No Easy bruising?: No  Cardiovascular Leg swelling?: Yes Chest pain?: No  Respiratory Cough?: Yes Shortness of breath?: Yes  Endocrine Excessive thirst?:  No  Musculoskeletal Back pain?: Yes Joint pain?: Yes  Neurological Headaches?: No Dizziness?: No  Psychologic Depression?: No Anxiety?: No  Physical Exam: BP (!) 174/100   Pulse (!) 54   Ht 5\' 5"  (1.651 m)   Wt 206 lb 4.8 oz (93.6 kg)   BMI 34.33 kg/m   Constitutional:  Alert and oriented, No acute distress. HEENT: [place] AT, moist mucus membranes.  Trachea midline, no masses. Cardiovascular: No clubbing, cyanosis, or edema. Respiratory: Normal respiratory effort, no increased work of breathing. GI: Abdomen is soft, nontender, nondistended, no abdominal masses GU: No CVA tenderness. Normal phallus. Testicles descended bilaterally benign. DRE: 2+ benign. Skin: No rashes, bruises or suspicious lesions. Lymph: No cervical or inguinal adenopathy. Neurologic: Grossly intact, no focal deficits, moving all 4 extremities. Psychiatric: Normal mood and affect.  Laboratory Data: No results found for: WBC, HGB, HCT, MCV, PLT  Lab Results  Component Value Date   CREATININE 1.27 04/03/2015    No results found for: PSA  No results found for: TESTOSTERONE  No results found for: HGBA1C  Urinalysis No results found for: COLORURINE, APPEARANCEUR, LABSPEC, PHURINE, GLUCOSEU, HGBUR, BILIRUBINUR, KETONESUR, PROTEINUR, UROBILINOGEN, NITRITE, LEUKOCYTESUR   Assessment & Plan:    1. Elevated PSA I discussed the patient the role of PSA screening and detecting prostate cancer. We did discuss his PSA has been elevated on 2 occasions and has actually risen over the last year. We discussed the next step is a prostate biopsy. We discussed the risks, benefits, indications of this procedure. He understands the risks include bleeding and infection. He understands any blood in his stool and urine for 24-48 hours in his semen for after 6 weeks. He also understands the risk for infection despite perioperative antibiotics required hospitalization. All questions were answered. The patient has elected to  proceed.  The patient is going on a cruise of the [place] for approximate 1 month. He leaves and less than 2 weeks. He is requested to hold off his biopsy until he returns. I think this is reasonable.  2. BPH Not bothered by symptoms. No treatment necessary.  Return for prostate  biopsy.  Nickie Retort, MD  [facility] [address] [place], [place] [address] [phone]

## 2016-07-22 ENCOUNTER — Other Ambulatory Visit: Payer: Self-pay | Admitting: Urology

## 2016-07-22 ENCOUNTER — Ambulatory Visit (INDEPENDENT_AMBULATORY_CARE_PROVIDER_SITE_OTHER): Payer: Medicare HMO | Admitting: Urology

## 2016-07-22 ENCOUNTER — Encounter: Payer: Self-pay | Admitting: Urology

## 2016-07-22 VITALS — BP 168/80 | HR 52 | Ht 68.0 in | Wt 208.6 lb

## 2016-07-22 DIAGNOSIS — R972 Elevated prostate specific antigen [PSA]: Secondary | ICD-10-CM

## 2016-07-22 MED ORDER — LEVOFLOXACIN 500 MG PO TABS
500.0000 mg | ORAL_TABLET | Freq: Once | ORAL | Status: AC
  Administered 2016-07-22: 500 mg via ORAL

## 2016-07-22 MED ORDER — GENTAMICIN SULFATE 40 MG/ML IJ SOLN
80.0000 mg | Freq: Once | INTRAMUSCULAR | Status: AC
  Administered 2016-07-22: 80 mg via INTRAMUSCULAR

## 2016-07-22 NOTE — Progress Notes (Signed)
Prostate Biopsy Procedure   Informed consent was obtained after discussing risks/benefits of the procedure.  A time out was performed to ensure correct patient identity.  Pre-Procedure: - Last PSA Level: 6.75 - Gentamicin given prophylactically - Levaquin 500 mg administered PO -Transrectal Ultrasound performed revealing a 43 gm prostate -No significant hypoechoic or median lobe noted  Procedure: - Prostate block performed using 10 cc 1% lidocaine and biopsies taken from sextant areas, a total of 12 under ultrasound guidance.  Post-Procedure: - Patient tolerated the procedure well - He was counseled to seek immediate medical attention if experiences any severe pain, significant bleeding, or fevers - Return in one week to discuss biopsy results

## 2016-07-28 ENCOUNTER — Other Ambulatory Visit: Payer: Self-pay | Admitting: Urology

## 2016-07-28 LAB — PATHOLOGY REPORT

## 2016-07-31 ENCOUNTER — Telehealth: Payer: Self-pay

## 2016-07-31 ENCOUNTER — Ambulatory Visit: Payer: BLUE CROSS/BLUE SHIELD | Admitting: Urology

## 2016-07-31 NOTE — Telephone Encounter (Signed)
Pt was notified of his negative prostate biopsy. He verbal stated understanding. He was scheduled for a 37mth f/u appt w/ PSA prior.

## 2017-01-28 ENCOUNTER — Other Ambulatory Visit: Payer: Medicare HMO

## 2017-01-28 DIAGNOSIS — R972 Elevated prostate specific antigen [PSA]: Secondary | ICD-10-CM

## 2017-01-29 LAB — PSA: PROSTATE SPECIFIC AG, SERUM: 7.4 ng/mL — AB (ref 0.0–4.0)

## 2017-02-04 ENCOUNTER — Ambulatory Visit: Payer: Medicare HMO

## 2017-02-18 ENCOUNTER — Encounter: Payer: Self-pay | Admitting: Urology

## 2017-02-18 ENCOUNTER — Ambulatory Visit (INDEPENDENT_AMBULATORY_CARE_PROVIDER_SITE_OTHER): Payer: Medicare HMO | Admitting: Urology

## 2017-02-18 VITALS — BP 155/82 | HR 60 | Ht 68.0 in | Wt 204.0 lb

## 2017-02-18 DIAGNOSIS — R972 Elevated prostate specific antigen [PSA]: Secondary | ICD-10-CM | POA: Diagnosis not present

## 2017-02-18 NOTE — Progress Notes (Signed)
02/18/2017 8:37 AM   Gregory Black 1945-12-02 [id number]  Referring provider: Kirk Ruths, MD [place] [facility] [place], [place] [address]  Chief Complaint  Patient presents with  . Elevated PSA    HPI: The patient is a 71 year old gentleman for follow-up of elevated PSA.  He does a history of a negative prostate biopsy in May 2018 except for one core of high-grade PIN.  His PSA rose to 7.4 in November  2018 from 6.75 which it was in March 2018 which spurred his prostate biopsy.  Patient with no urological complaints or changes to his health status.  PSA History: 7.4   11/18 6.75 3/18 5.1   1/17        PMH: Past Medical History:  Diagnosis Date  . Aortic stenosis 07/27/14   Echo   . Arthritis    hands, knees  . Bulging lumbar disc   . Cardiac murmur   . History of concussion   . Neuropathy   . Reflux    in past  . Sleep apnea    pt denies  . Weakness of both legs    pt says from Vit b12 deficiency  . Wears dentures    full upper and lower    Surgical History: Past Surgical History:  Procedure Laterality Date  . CATARACT EXTRACTION Left 2015  . COLONOSCOPY    . COLONOSCOPY WITH PROPOFOL N/A 05/17/2015   Procedure: COLONOSCOPY WITH PROPOFOL;  Surgeon: Lucilla Lame, MD;  Location: [facility];  Service: Endoscopy;  Laterality: N/A;  . KNEE ARTHROSCOPY W/ MENISCAL REPAIR Right   . TONSILLECTOMY AND ADENOIDECTOMY      Home Medications:  Allergies as of 02/18/2017      Reactions   Pollen Extract    Vicodin [hydrocodone-acetaminophen]    Other reaction(s): Other (See Comments) Unable to void or empty bowels Unable to void or empty bowels      Medication List        Accurate as of 02/18/17  8:37 AM. Always use your most recent med list.          aspirin 81 MG tablet Take 81 mg by mouth daily.   Fish Oil 500 MG Caps Take by mouth.   multivitamin tablet Take 1 tablet by mouth daily.   vitamin B-12  1000 MCG tablet Commonly known as:  CYANOCOBALAMIN Take 1,000 mcg by mouth daily.       Allergies:  Allergies  Allergen Reactions  . Pollen Extract   . Vicodin [Hydrocodone-Acetaminophen]     Other reaction(s): Other (See Comments) Unable to void or empty bowels Unable to void or empty bowels    Family History: Family History  Problem Relation Age of Onset  . Heart disease Mother   . Stroke Mother   . Diabetes Mother        high blood sugar  . Hypertension Mother   . Cancer Father   . Arthritis Father   . Cancer Brother        throat  . Diabetes Brother   . Lung disease Brother        asbestos    Social History:  reports that he has quit smoking. he has never used smokeless tobacco. He reports that he drinks about 0.6 oz of alcohol per week. He reports that he does not use drugs.  ROS: UROLOGY Frequent Urination?: Yes Hard to postpone urination?: Yes Burning/pain with urination?: No Get up at night  to urinate?: Yes Leakage of urine?: No Urine stream starts and stops?: No Trouble starting stream?: No Do you have to strain to urinate?: No Blood in urine?: No Urinary tract infection?: No Sexually transmitted disease?: No Injury to kidneys or bladder?: No Painful intercourse?: No Weak stream?: No Erection problems?: No Penile pain?: No  Gastrointestinal Nausea?: No Vomiting?: No Indigestion/heartburn?: No Diarrhea?: No Constipation?: No  Constitutional Fever: No Night sweats?: No Weight loss?: No Fatigue?: No  Skin Skin rash/lesions?: No Itching?: Yes  Eyes Blurred vision?: Yes Double vision?: Yes  Ears/Nose/Throat Sore throat?: No Sinus problems?: Yes  Hematologic/Lymphatic Swollen glands?: No Easy bruising?: No  Cardiovascular Leg swelling?: No Chest pain?: No  Respiratory Cough?: No Shortness of breath?: No  Endocrine Excessive thirst?: No  Musculoskeletal Back pain?: No Joint pain?: Yes  Neurological Headaches?:  No Dizziness?: No  Psychologic Depression?: No Anxiety?: No  Physical Exam: BP (!) 155/82 (BP Location: Left Arm, Patient Position: Sitting, Cuff Size: Normal)   Pulse 60   Ht 5\' 8"  (1.727 m)   Wt 204 lb (92.5 kg)   BMI 31.02 kg/m   Constitutional:  Alert and oriented, No acute distress. HEENT: [place] AT, moist mucus membranes.  Trachea midline, no masses. Cardiovascular: No clubbing, cyanosis, or edema. Respiratory: Normal respiratory effort, no increased work of breathing. GI: Abdomen is soft, nontender, nondistended, no abdominal masses GU: No CVA tenderness.  Skin: No rashes, bruises or suspicious lesions. Lymph: No cervical or inguinal adenopathy. Neurologic: Grossly intact, no focal deficits, moving all 4 extremities. Psychiatric: Normal mood and affect.  Laboratory Data: No results found for: WBC, HGB, HCT, MCV, PLT  Lab Results  Component Value Date   CREATININE 1.27 04/03/2015    No results found for: PSA  No results found for: TESTOSTERONE  No results found for: HGBA1C  Urinalysis No results found for: COLORURINE, APPEARANCEUR, LABSPEC, PHURINE, GLUCOSEU, HGBUR, BILIRUBINUR, KETONESUR, PROTEINUR, UROBILINOGEN, NITRITE, LEUKOCYTESUR    Assessment & Plan:    1.  Elevated PSA I discussed with the patient that his PSA continues to rise despite his negative prostate biopsy 6 months ago.  I discussed options with him which include continue to monitor with PSA,  prostate biopsy, and MRI of prostate.  We discussed the risks and benefits of each.  At this point, the patient has elected to pursue a prostate MRI.  He will follow-up with the results.  Return for after prostate MRI.  Nickie Retort, MD  [facility] [address], [place], [place] [address] [phone]

## 2017-02-22 ENCOUNTER — Telehealth: Payer: Self-pay | Admitting: Urology

## 2017-02-22 NOTE — Telephone Encounter (Signed)
Dr. Pilar Jarvis ordered a prostate MRI and he wanted him it done @ [facility] imaging. Order was faxed to them @ [phone] and they will call the patient with an app.  Sharyn Lull

## 2017-02-23 ENCOUNTER — Other Ambulatory Visit: Payer: Self-pay | Admitting: Urology

## 2017-02-23 DIAGNOSIS — R972 Elevated prostate specific antigen [PSA]: Secondary | ICD-10-CM

## 2017-03-08 ENCOUNTER — Ambulatory Visit
Admission: RE | Admit: 2017-03-08 | Discharge: 2017-03-08 | Disposition: A | Discharge status: Home / Self Care | Payer: Medicare HMO | Source: Ambulatory Visit | Attending: Urology | Admitting: Urology

## 2017-03-08 DIAGNOSIS — R972 Elevated prostate specific antigen [PSA]: Secondary | ICD-10-CM

## 2017-03-08 MED ORDER — GADOBENATE DIMEGLUMINE 529 MG/ML IV SOLN: 20.0000 mL | Freq: Once | INTRAVENOUS | Status: DC | PRN

## 2017-03-11 ENCOUNTER — Ambulatory Visit (INDEPENDENT_AMBULATORY_CARE_PROVIDER_SITE_OTHER): Payer: Medicare HMO | Admitting: Urology

## 2017-03-11 ENCOUNTER — Encounter: Payer: Self-pay | Admitting: Urology

## 2017-03-11 VITALS — BP 120/74 | HR 54 | Ht 68.0 in | Wt 203.9 lb

## 2017-03-11 DIAGNOSIS — R972 Elevated prostate specific antigen [PSA]: Secondary | ICD-10-CM | POA: Diagnosis not present

## 2017-03-11 NOTE — Progress Notes (Signed)
03/11/2017 10:11 AM   Gregory Black 1945/04/21 [id number]  Referring provider: Kirk Ruths, MD [place] [facility] [place], [place] [address]  Chief Complaint  Patient presents with  . Elevated PSA    MRI results    HPI: The patient is a 71 year old gentleman for follow-up of elevated PSA.  He does a history of a negative prostate biopsy in May 2018 except for one core of high-grade PIN.  His PSA rose to 7.4 in November 2018 from 6.75 which it was in March 2018 which spurred his prostate biopsy.  Patient with no urological complaints or changes to his health status.  PSA History: 7.4   11/18 6.75 3/18 5.1 1/17  Due to his PSA rising, he underwent a prostate MRI.  This did show a 1.2 cm lesion at the right apical transition zone that was PI-RADS 3 lesion.   PMH: Past Medical History:  Diagnosis Date  . Aortic stenosis 07/27/14   Echo   . Arthritis    hands, knees  . Bulging lumbar disc   . Cardiac murmur   . History of concussion   . Neuropathy   . Reflux    in past  . Sleep apnea    pt denies  . Weakness of both legs    pt says from Vit b12 deficiency  . Wears dentures    full upper and lower    Surgical History: Past Surgical History:  Procedure Laterality Date  . CATARACT EXTRACTION Left 2015  . COLONOSCOPY    . COLONOSCOPY WITH PROPOFOL N/A 05/17/2015   Procedure: COLONOSCOPY WITH PROPOFOL;  Surgeon: Lucilla Lame, MD;  Location: [facility];  Service: Endoscopy;  Laterality: N/A;  . KNEE ARTHROSCOPY W/ MENISCAL REPAIR Right   . TONSILLECTOMY AND ADENOIDECTOMY      Home Medications:  Allergies as of 03/11/2017      Reactions   Pollen Extract    Vicodin [hydrocodone-acetaminophen]    Other reaction(s): Other (See Comments) Unable to void or empty bowels Unable to void or empty bowels      Medication List        Accurate as of 03/11/17 10:11 AM. Always use your most recent med list.            aspirin 81 MG tablet Take 81 mg by mouth daily.   Fish Oil 500 MG Caps Take by mouth.   multivitamin tablet Take 1 tablet by mouth daily.   vitamin B-12 1000 MCG tablet Commonly known as:  CYANOCOBALAMIN Take 1,000 mcg by mouth daily.       Allergies:  Allergies  Allergen Reactions  . Pollen Extract   . Vicodin [Hydrocodone-Acetaminophen]     Other reaction(s): Other (See Comments) Unable to void or empty bowels Unable to void or empty bowels    Family History: Family History  Problem Relation Age of Onset  . Heart disease Mother   . Stroke Mother   . Diabetes Mother        high blood sugar  . Hypertension Mother   . Cancer Father   . Arthritis Father   . Cancer Brother        throat  . Diabetes Brother   . Lung disease Brother        asbestos    Social History:  reports that he has quit smoking. he has never used smokeless tobacco. He reports that he drinks about 0.6 oz of alcohol per week.  He reports that he does not use drugs.  ROS: UROLOGY Frequent Urination?: Yes Hard to postpone urination?: Yes Burning/pain with urination?: No Get up at night to urinate?: Yes Leakage of urine?: No Urine stream starts and stops?: Yes Trouble starting stream?: No Do you have to strain to urinate?: No Blood in urine?: No Urinary tract infection?: No Sexually transmitted disease?: No Injury to kidneys or bladder?: No Painful intercourse?: No Weak stream?: No Erection problems?: No Penile pain?: No  Gastrointestinal Nausea?: No Vomiting?: No Indigestion/heartburn?: No Diarrhea?: No Constipation?: No  Constitutional Fever: No Night sweats?: No Weight loss?: No Fatigue?: No  Skin Skin rash/lesions?: No Itching?: No  Eyes Blurred vision?: Yes Double vision?: No  Ears/Nose/Throat Sore throat?: No Sinus problems?: No  Hematologic/Lymphatic Swollen glands?: No Easy bruising?: No  Cardiovascular Leg swelling?: No Chest pain?:  No  Respiratory Cough?: No Shortness of breath?: No  Endocrine Excessive thirst?: No  Musculoskeletal Back pain?: No Joint pain?: No  Neurological Headaches?: No Dizziness?: No  Psychologic Depression?: No Anxiety?: No  Physical Exam: BP 120/74 (BP Location: Right Arm, Patient Position: Sitting, Cuff Size: Normal)   Pulse (!) 54   Ht 5\' 8"  (1.727 m)   Wt 203 lb 14.4 oz (92.5 kg)   BMI 31.00 kg/m   Constitutional:  Alert and oriented, No acute distress. HEENT: [place] AT, moist mucus membranes.  Trachea midline, no masses. Cardiovascular: No clubbing, cyanosis, or edema. Respiratory: Normal respiratory effort, no increased work of breathing. GI: Abdomen is soft, nontender, nondistended, no abdominal masses GU: No CVA tenderness.  Skin: No rashes, bruises or suspicious lesions. Lymph: No cervical or inguinal adenopathy. Neurologic: Grossly intact, no focal deficits, moving all 4 extremities. Psychiatric: Normal mood and affect.  Laboratory Data: No results found for: WBC, HGB, HCT, MCV, PLT  Lab Results  Component Value Date   CREATININE 1.27 04/03/2015    No results found for: PSA  No results found for: TESTOSTERONE  No results found for: HGBA1C  Urinalysis No results found for: COLORURINE, APPEARANCEUR, LABSPEC, PHURINE, GLUCOSEU, HGBUR, BILIRUBINUR, KETONESUR, PROTEINUR, UROBILINOGEN, NITRITE, LEUKOCYTESUR   Assessment & Plan:    1.  Elevated PSA I discussed the patient that he has indeterminate PI-RADS 3 lesion on his prostate MRI.  We did discuss that this could potentially represent higher risk malignancy.  At this point, I have recommended he undergo a fusion biopsy to target this area in particular as well as repeat his previous biopsy due to his rising PSA and negative biopsy in May 2018.  We did discuss that this would be performed at [facility] urology in [place] as we do not have equipment for the fusion imaging in this office.  He will follow-up back  at [facility] for results.   Nickie Retort, MD  [facility] [address] [place], [place] [address] [phone]

## 2017-04-22 ENCOUNTER — Other Ambulatory Visit: Payer: Self-pay | Admitting: Urology

## 2017-04-22 ENCOUNTER — Ambulatory Visit: Payer: Medicare HMO

## 2017-04-22 ENCOUNTER — Ambulatory Visit (INDEPENDENT_AMBULATORY_CARE_PROVIDER_SITE_OTHER): Payer: Medicare HMO | Admitting: Urology

## 2017-04-22 ENCOUNTER — Encounter: Payer: Self-pay | Admitting: Urology

## 2017-04-22 VITALS — BP 145/81 | HR 52 | Ht 69.0 in | Wt 196.0 lb

## 2017-04-22 DIAGNOSIS — C61 Malignant neoplasm of prostate: Secondary | ICD-10-CM | POA: Diagnosis not present

## 2017-04-22 NOTE — Progress Notes (Signed)
04/22/2017 11:45 AM   Gregory Black 1945-10-20 [id number]  Referring provider: Kirk Ruths, MD [facility] [place], [place] [address]  Chief Complaint  Patient presents with  . Prostate Cancer    Fusion Biopsy results  . Results    HPI: The patient is a 72 year old gentlemanfor follow-up fusion prostate biopsy.Marland Kitchen He does a history of a negative prostate biopsy in May 2018 except for one core of high-grade PIN. His PSA rose to 7.4 in November2018 from 6.75 which it was in March 2018 which spurred his prostate biopsy.   PSA History: 7.4 11/18 6.75 3/18 5.1 1/17  Due to his PSA rising, he underwent a prostate MRI.  This did show a 1.2 cm lesion at the right apical transition zone that was PI-RADS 3 lesion.    He underwent a MRI fusion biopsy as result of this.  The region of interest showed Gleason 3+4 = 7 prostate cancer in approximately 30% of histologically evaluated tissue.  He also had 4 of 12 cores positive for Gleason 3+3 = 6 on the right side.     PMH: Past Medical History:  Diagnosis Date  . Aortic stenosis 07/27/14   Echo   . Arthritis    hands, knees  . Bulging lumbar disc   . Cardiac murmur   . History of concussion   . Neuropathy   . Reflux    in past  . Sleep apnea    pt denies  . Weakness of both legs    pt says from Vit b12 deficiency  . Wears dentures    full upper and lower    Surgical History: Past Surgical History:  Procedure Laterality Date  . CATARACT EXTRACTION Left 2015  . COLONOSCOPY    . COLONOSCOPY WITH PROPOFOL N/A 05/17/2015   Procedure: COLONOSCOPY WITH PROPOFOL;  Surgeon: Lucilla Lame, MD;  Location: [facility];  Service: Endoscopy;  Laterality: N/A;  . KNEE ARTHROSCOPY W/ MENISCAL REPAIR Right   . TONSILLECTOMY AND ADENOIDECTOMY      Home Medications:  Allergies as of 04/22/2017      Reactions   Pollen Extract    Vicodin [hydrocodone-acetaminophen]    Other  reaction(s): Other (See Comments) Unable to void or empty bowels Unable to void or empty bowels      Medication List        Accurate as of 04/22/17 11:45 AM. Always use your most recent med list.          aspirin 81 MG tablet Take 81 mg by mouth daily.   Fish Oil 500 MG Caps Take by mouth.   multivitamin tablet Take 1 tablet by mouth daily.   vitamin B-12 1000 MCG tablet Commonly known as:  CYANOCOBALAMIN Take 1,000 mcg by mouth daily.       Allergies:  Allergies  Allergen Reactions  . Pollen Extract   . Vicodin [Hydrocodone-Acetaminophen]     Other reaction(s): Other (See Comments) Unable to void or empty bowels Unable to void or empty bowels    Family History: Family History  Problem Relation Age of Onset  . Heart disease Mother   . Stroke Mother   . Diabetes Mother        high blood sugar  . Hypertension Mother   . Cancer Father   . Arthritis Father   . Cancer Brother        throat  . Diabetes Brother   . Lung disease Brother  asbestos    Social History:  reports that he has quit smoking. he has never used smokeless tobacco. He reports that he drinks about 0.6 oz of alcohol per week. He reports that he does not use drugs.  ROS: UROLOGY Frequent Urination?: Yes Hard to postpone urination?: Yes Burning/pain with urination?: No Get up at night to urinate?: Yes Leakage of urine?: No Urine stream starts and stops?: No Trouble starting stream?: No Do you have to strain to urinate?: No Blood in urine?: No Urinary tract infection?: No Sexually transmitted disease?: No Injury to kidneys or bladder?: No Painful intercourse?: No Weak stream?: No Erection problems?: No Penile pain?: No  Gastrointestinal Nausea?: No Vomiting?: No Indigestion/heartburn?: No Diarrhea?: No Constipation?: No  Constitutional Fever: No Night sweats?: No Weight loss?: No Fatigue?: No  Skin Skin rash/lesions?: No Itching?: No  Eyes Blurred vision?:  No Double vision?: No  Ears/Nose/Throat Sore throat?: No Sinus problems?: No  Hematologic/Lymphatic Swollen glands?: No Easy bruising?: No  Cardiovascular Leg swelling?: No Chest pain?: No  Respiratory Cough?: No Shortness of breath?: No  Endocrine Excessive thirst?: No  Musculoskeletal Back pain?: No Joint pain?: Yes  Neurological Headaches?: No Dizziness?: No  Psychologic Depression?: No Anxiety?: No  Physical Exam: BP (!) 145/81   Pulse (!) 52   Ht 5\' 9"  (1.753 m)   Wt 196 lb (88.9 kg)   BMI 28.94 kg/m   Constitutional:  Alert and oriented, No acute distress. HEENT: [place] AT, moist mucus membranes.  Trachea midline, no masses. Cardiovascular: No clubbing, cyanosis, or edema. Respiratory: Normal respiratory effort, no increased work of breathing. GI: Abdomen is soft, nontender, nondistended, no abdominal masses GU: No CVA tenderness.  Skin: No rashes, bruises or suspicious lesions. Lymph: No cervical or inguinal adenopathy. Neurologic: Grossly intact, no focal deficits, moving all 4 extremities. Psychiatric: Normal mood and affect.  Laboratory Data: No results found for: WBC, HGB, HCT, MCV, PLT  Lab Results  Component Value Date   CREATININE 1.27 04/03/2015    No results found for: PSA  No results found for: TESTOSTERONE  No results found for: HGBA1C  Urinalysis No results found for: COLORURINE, APPEARANCEUR, LABSPEC, PHURINE, GLUCOSEU, HGBUR, BILIRUBINUR, KETONESUR, PROTEINUR, UROBILINOGEN, NITRITE, LEUKOCYTESUR   Assessment & Plan:    1.  Intermediate risk prostate cancer I discussed the patient his new diagnosis of intermediate risk prostate cancer.  We did discuss treatment options for this but did focus on the fact that this generally recommends undergoing active treatment.  We discussed both radiation therapy and a robotic prostatectomy.  We discussed the risks, benefits, indications of both.  The patient was leading toward radiation  therapy at the end of our conversation.  He is particularly interested in brachytherapy.  I will arrange for him to see our radiation oncologist, Dr. Baruch Gouty, to discuss this further.  I will see him back in 1 month.  Return in about 4 weeks (around 05/20/2017).  Nickie Retort, MD  [facility] [address], [place], [place] [address] [phone]

## 2017-05-06 ENCOUNTER — Ambulatory Visit
Admission: RE | Admit: 2017-05-06 | Discharge: 2017-05-06 | Disposition: A | Discharge status: Home / Self Care | Payer: Medicare HMO | Source: Ambulatory Visit | Attending: Radiation Oncology | Admitting: Radiation Oncology

## 2017-05-06 ENCOUNTER — Other Ambulatory Visit: Payer: Self-pay

## 2017-05-06 ENCOUNTER — Encounter: Payer: Self-pay | Admitting: Radiation Oncology

## 2017-05-06 VITALS — BP 140/87 | HR 68 | Temp 98.4°F | Resp 16 | Ht 69.0 in | Wt 198.3 lb

## 2017-05-06 DIAGNOSIS — Z809 Family history of malignant neoplasm, unspecified: Secondary | ICD-10-CM | POA: Insufficient documentation

## 2017-05-06 DIAGNOSIS — I350 Nonrheumatic aortic (valve) stenosis: Secondary | ICD-10-CM | POA: Insufficient documentation

## 2017-05-06 DIAGNOSIS — G4730 Sleep apnea, unspecified: Secondary | ICD-10-CM | POA: Diagnosis not present

## 2017-05-06 DIAGNOSIS — M5136 Other intervertebral disc degeneration, lumbar region: Secondary | ICD-10-CM | POA: Insufficient documentation

## 2017-05-06 DIAGNOSIS — Z87891 Personal history of nicotine dependence: Secondary | ICD-10-CM | POA: Diagnosis not present

## 2017-05-06 DIAGNOSIS — R001 Bradycardia, unspecified: Secondary | ICD-10-CM | POA: Diagnosis not present

## 2017-05-06 DIAGNOSIS — G629 Polyneuropathy, unspecified: Secondary | ICD-10-CM | POA: Diagnosis not present

## 2017-05-06 DIAGNOSIS — C61 Malignant neoplasm of prostate: Secondary | ICD-10-CM | POA: Diagnosis present

## 2017-05-06 DIAGNOSIS — Z7982 Long term (current) use of aspirin: Secondary | ICD-10-CM | POA: Insufficient documentation

## 2017-05-06 DIAGNOSIS — Z79899 Other long term (current) drug therapy: Secondary | ICD-10-CM | POA: Insufficient documentation

## 2017-05-06 DIAGNOSIS — K219 Gastro-esophageal reflux disease without esophagitis: Secondary | ICD-10-CM | POA: Insufficient documentation

## 2017-05-06 DIAGNOSIS — R531 Weakness: Secondary | ICD-10-CM | POA: Insufficient documentation

## 2017-05-06 NOTE — Progress Notes (Signed)
Patient here for follow patient having urgency but has a hard time starting a stream.

## 2017-05-06 NOTE — Consult Note (Signed)
NEW PATIENT EVALUATION  Name: Gregory Black  MRN: [id number]  Date:   05/06/2017     DOB: 1945-09-01   This 72 y.o. 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  REFERRING PHYSICIAN: Kirk Ruths, MD  CHIEF COMPLAINT:  Chief Complaint  Patient presents with  . Prostate Mass    DIAGNOSIS: The encounter diagnosis was Malignant neoplasm of prostate ([place]).   PREVIOUS INVESTIGATIONS:  Pathology reports reviewed MRI scan reviewed Clinical notes reviewed  HPI: Patient is a 72 year old male who has a slowly rising PSA first detected at 5.1 back in January 2017 most recently November 2018 7.4. In May 2018 he had prostate biopsy showing 1 core with high-grade PIN. He underwent a prostate MRI showing a a 1.2 cm lesion of the right apical transitional cell BI-RADS 3. He MRI fusion biopsy showing 1 core positive for Gleason 7 (3+4) adenocarcinoma. He also had 4 cores positive for Gleason 6 adenocarcinoma. Patient does have frequency urgency and nocturia 2. He has had treatment options discussed with urology patient is leaning towards I-125 interstitial implant. He is seen today for consultation. He does have some comorbidities including aortic stenosis, sleep apnea and some bilateral lower extremity weakness.  PLANNED TREATMENT REGIMEN: I-125 interstitial implant  PAST MEDICAL HISTORY:  has a past medical history of Aortic stenosis (07/27/14), Arthritis, Bulging lumbar disc, Cardiac murmur, History of concussion, Neuropathy, Reflux, Sleep apnea, Weakness of both legs, and Wears dentures.    PAST SURGICAL HISTORY:  Past Surgical History:  Procedure Laterality Date  . CATARACT EXTRACTION Left 2015  . COLONOSCOPY    . COLONOSCOPY WITH PROPOFOL N/A 05/17/2015   Procedure: COLONOSCOPY WITH PROPOFOL;  Surgeon: Lucilla Lame, MD;  Location: [facility];  Service: Endoscopy;   Laterality: N/A;  . KNEE ARTHROSCOPY W/ MENISCAL REPAIR Right   . TONSILLECTOMY AND ADENOIDECTOMY      FAMILY HISTORY: family history includes Arthritis in his father; Cancer in his brother and father; Diabetes in his brother and mother; Heart disease in his mother; Hypertension in his mother; Lung disease in his brother; Stroke in his mother.  SOCIAL HISTORY:  reports that he has quit smoking. he has never used smokeless tobacco. He reports that he drinks about 0.6 oz of alcohol per week. He reports that he does not use drugs.  ALLERGIES: Pollen extract and Vicodin [hydrocodone-acetaminophen]  MEDICATIONS:  Current Outpatient Medications  Medication Sig Dispense Refill  . aspirin 81 MG tablet Take 81 mg by mouth daily.    . Multiple Vitamin (MULTIVITAMIN) tablet Take 1 tablet by mouth daily.    . Omega-3 Fatty Acids (FISH OIL) 500 MG CAPS Take by mouth.    . vitamin B-12 (CYANOCOBALAMIN) 1000 MCG tablet Take 1,000 mcg by mouth daily.     No current facility-administered medications for this encounter.     ECOG PERFORMANCE STATUS:  0 - Asymptomatic  REVIEW OF SYSTEMS:  Patient denies any weight loss, fatigue, weakness, fever, chills or night sweats. Patient denies any loss of vision, blurred vision. Patient denies any ringing  of the ears or hearing loss. No irregular heartbeat. Patient denies heart murmur or history of fainting. Patient denies any chest pain or pain radiating to her upper extremities. Patient denies any shortness of breath, difficulty breathing at night, cough or hemoptysis. Patient denies any swelling in the lower legs. Patient denies any nausea  vomiting, vomiting of blood, or coffee ground material in the vomitus. Patient denies any stomach pain. Patient states has had normal bowel movements no significant constipation or diarrhea. Patient denies any dysuria, hematuria or significant nocturia. Patient denies any problems walking, swelling in the joints or loss of  balance. Patient denies any skin changes, loss of hair or loss of weight. Patient denies any excessive worrying or anxiety or significant depression. Patient denies any problems with insomnia. Patient denies excessive thirst, polyuria, polydipsia. Patient denies any swollen glands, patient denies easy bruising or easy bleeding. Patient denies any recent infections, allergies or URI. Patient "s visual fields have not changed significantly in recent time.    PHYSICAL EXAM: BP 140/87 (BP Location: Left Arm, Patient Position: Sitting, Cuff Size: Normal)   Pulse 68   Temp 98.4 F (36.9 C) (Tympanic)   Resp 16   Ht 5\' 9"  (1.753 m)   Wt 198 lb 4.9 oz (89.9 kg)   BMI 29.28 kg/m  On rectal exam rectal sphincter tone is good. Prostate is smooth without evidence of nodularity or mass. Sulcus is preserved bilaterally. Well-developed well-nourished patient in NAD. HEENT reveals PERLA, EOMI, discs not visualized.  Oral cavity is clear. No oral mucosal lesions are identified. Neck is clear without evidence of cervical or supraclavicular adenopathy. Lungs are clear to A&P. Cardiac examination is essentially unremarkable with regular rate and rhythm without murmur rub or thrill. Abdomen is benign with no organomegaly or masses noted. Motor sensory and DTR levels are equal and symmetric in the upper and lower extremities. Cranial nerves II through XII are grossly intact. Proprioception is intact. No peripheral adenopathy or edema is identified. No motor or sensory levels are noted. Crude visual fields are within normal range.  LABORATORY DATA: Pathology reports reviewed    RADIOLOGY RESULTS: MRI scan is reviewed compatible with the above-stated findings   IMPRESSION: Stage IIB intermediate risk adenocarcinoma the prostate Gleason 7 (36+59) in 72 year old male  PLAN: At this Time I got over treatment recommendations including radical prostatectomy external beam radiation therapy and I-125 interstitial implant.  Risks and benefits of all treatments were reviewed with the patient. Patient is focusing in on I-125 interstitial implant risks of treatment including risks of general anesthesia possible increasing lower urinary tract symptoms fatigue alteration of blood counts increase in diarrhea as well as radiation safety precautions during his first 2 months of implantation all were discussed in detail with the patient. He seems to comprehend my treatment plan well. We will arrange with Dr. Cherrie Gauze office his volume study to be followed by the I-125 interstitial implant. Patient will be out of town the month of April's we will work around that probably doing fine study [place] April and his implant afterwards. Patient is to call with any further questions or concerns.  I would like to take this opportunity to thank you for allowing me to participate in the care of your patient.Noreene Filbert, MD

## 2017-05-17 ENCOUNTER — Other Ambulatory Visit: Payer: Self-pay | Admitting: Radiology

## 2017-05-17 DIAGNOSIS — C61 Malignant neoplasm of prostate: Secondary | ICD-10-CM

## 2017-05-21 ENCOUNTER — Ambulatory Visit (INDEPENDENT_AMBULATORY_CARE_PROVIDER_SITE_OTHER): Payer: Medicare HMO | Admitting: Urology

## 2017-05-21 ENCOUNTER — Encounter: Payer: Self-pay | Admitting: Urology

## 2017-05-21 VITALS — BP 145/81 | HR 44 | Resp 16 | Ht 70.0 in | Wt 202.9 lb

## 2017-05-21 DIAGNOSIS — C61 Malignant neoplasm of prostate: Secondary | ICD-10-CM | POA: Diagnosis not present

## 2017-05-21 NOTE — Progress Notes (Signed)
05/21/2017 9:08 AM   Gregory Black 27-Mar-1945 [id number]  Referring provider: Kirk Ruths, MD [name], [place] [address]  Chief Complaint  Patient presents with  . Follow-up    prostate biopsy     HPI: The patient is a 72 year old gentlemanfor follow-up to discuss his recent prostate cancer diagnosis.Marland Kitchen He does a history of a negative prostate biopsy in May 2018 except for one core of high-grade PIN. His PSA rose to 7.4 in November2018 from 6.75 which it was in March 2018 which spurred his prostate biopsy.   PSA History: 7.4 11/18 6.75 3/18 5.1 1/17  Due to his PSA rising, he underwent a prostate MRI. This did show a 1.2 cm lesion at the right apical transition zone that was PI-RADS 3 lesion.    He underwent a MRI fusion biopsy as result of this.  The region of interest showed Gleason 3+4 = 7 prostate cancer in approximately 30% of histologically evaluated tissue.  He also had 4 of 12 cores positive for Gleason 3+3 = 6 on the right side.  At his last office visit, we discussed the prostatectomy versus radiation therapy.  He has since seen radiation oncology.  He has since decided to pursue brachytherapy.  He is Artie scheduled for his volume study and seed placement.  He has minimal questions at this time.  He is interested in pursuing this treatment option.Marland Kitchen      PMH: Past Medical History:  Diagnosis Date  . Aortic stenosis 07/27/14   Echo   . Arthritis    hands, knees  . Bulging lumbar disc   . Cardiac murmur   . History of concussion   . Neuropathy   . Reflux    in past  . Sleep apnea    pt denies  . Weakness of both legs    pt says from Vit b12 deficiency  . Wears dentures    full upper and lower    Surgical History: Past Surgical History:  Procedure Laterality Date  . CATARACT EXTRACTION Left 2015  . COLONOSCOPY    . COLONOSCOPY WITH PROPOFOL N/A 05/17/2015   Procedure: COLONOSCOPY WITH  PROPOFOL;  Surgeon: Gregory Lame, MD;  Location: [facility];  Service: Endoscopy;  Laterality: N/A;  . KNEE ARTHROSCOPY W/ MENISCAL REPAIR Right   . TONSILLECTOMY AND ADENOIDECTOMY      Home Medications:  Allergies as of 05/21/2017      Reactions   Pollen Extract    Vicodin [hydrocodone-acetaminophen]    Other reaction(s): Other (See Comments) Unable to void or empty bowels Unable to void or empty bowels      Medication List        Accurate as of 05/21/17  9:08 AM. Always use your most recent med list.          aspirin 81 MG tablet Take 81 mg by mouth daily.   Fish Oil 500 MG Caps Take by mouth.   multivitamin tablet Take 1 tablet by mouth daily.   vitamin B-12 1000 MCG tablet Commonly known as:  CYANOCOBALAMIN Take 1,000 mcg by mouth daily.       Allergies:  Allergies  Allergen Reactions  . Pollen Extract   . Vicodin [Hydrocodone-Acetaminophen]     Other reaction(s): Other (See Comments) Unable to void or empty bowels Unable to void or empty bowels    Family History: Family History  Problem Relation Age of Onset  . Heart disease  Mother   . Stroke Mother   . Diabetes Mother        high blood sugar  . Hypertension Mother   . Cancer Father   . Arthritis Father   . Cancer Brother        throat  . Diabetes Brother   . Lung disease Brother        asbestos    Social History:  reports that he has quit smoking. he has never used smokeless tobacco. He reports that he drinks about 0.6 oz of alcohol per week. He reports that he does not use drugs.  ROS: UROLOGY Frequent Urination?: No Hard to postpone urination?: No Burning/pain with urination?: No Get up at night to urinate?: No Leakage of urine?: No Urine stream starts and stops?: No Trouble starting stream?: No Do you have to strain to urinate?: No Blood in urine?: No Urinary tract infection?: No Sexually transmitted disease?: No Injury to kidneys or bladder?: No Painful intercourse?:  No Weak stream?: No Erection problems?: No Penile pain?: No  Gastrointestinal Nausea?: No Vomiting?: No Indigestion/heartburn?: No Diarrhea?: No Constipation?: No  Constitutional Fever: No Night sweats?: No Weight loss?: No Fatigue?: No  Skin Skin rash/lesions?: No Itching?: Yes  Eyes Blurred vision?: No Double vision?: No  Ears/Nose/Throat Sore throat?: No Sinus problems?: No  Hematologic/Lymphatic Swollen glands?: No Easy bruising?: No  Cardiovascular Leg swelling?: No Chest pain?: No  Respiratory Cough?: Yes Shortness of breath?: No  Endocrine Excessive thirst?: No  Musculoskeletal Back pain?: No Joint pain?: No  Neurological Headaches?: No Dizziness?: No  Psychologic Depression?: No Anxiety?: No  Physical Exam: BP (!) 145/81   Pulse (!) 44   Resp 16   Ht 5\' 10"  (1.778 m)   Wt 202 lb 14.4 oz (92 kg)   SpO2 97%   BMI 29.11 kg/m   Constitutional:  Alert and oriented, No acute distress. HEENT: [place] AT, moist mucus membranes.  Trachea midline, no masses. Cardiovascular: No clubbing, cyanosis, or edema. Respiratory: Normal respiratory effort, no increased work of breathing. GI: Abdomen is soft, nontender, nondistended, no abdominal masses GU: No CVA tenderness.  Skin: No rashes, bruises or suspicious lesions. Lymph: No cervical or inguinal adenopathy. Neurologic: Grossly intact, no focal deficits, moving all 4 extremities. Psychiatric: Normal mood and affect.  Laboratory Data: No results found for: WBC, HGB, HCT, MCV, PLT  Lab Results  Component Value Date   CREATININE 1.27 04/03/2015    No results found for: PSA  No results found for: TESTOSTERONE  No results found for: HGBA1C  Urinalysis No results found for: COLORURINE, APPEARANCEUR, LABSPEC, PHURINE, GLUCOSEU, HGBUR, BILIRUBINUR, KETONESUR, PROTEINUR, UROBILINOGEN, NITRITE, LEUKOCYTESUR   Assessment & Plan:    1.  Intermediate risk prostate cancer The patient is Artie  scheduled to begin his brachii therapy in the near future.  All questions were answered regarding his disease process as well as side effects from this treatment option.  All questions were answered.  The patient will follow-up as scheduled for his volume study followed by brachytherapy.  Return for as scheduled for brachytherapy.  Gregory Retort, MD  [facility] [address] [place], [place] [address] [phone]

## 2017-06-08 ENCOUNTER — Encounter: Payer: Self-pay | Admitting: Radiation Oncology

## 2017-06-08 ENCOUNTER — Ambulatory Visit: Admission: RE | Admit: 2017-06-08 | Payer: Medicare HMO | Source: Ambulatory Visit | Admitting: Urology

## 2017-06-08 ENCOUNTER — Ambulatory Visit
Admission: RE | Admit: 2017-06-08 | Discharge: 2017-06-08 | Disposition: A | Discharge status: Home / Self Care | Payer: Medicare HMO | Source: Ambulatory Visit | Attending: Radiation Oncology | Admitting: Radiation Oncology

## 2017-06-08 ENCOUNTER — Other Ambulatory Visit: Payer: Self-pay

## 2017-06-08 VITALS — BP 166/83 | HR 47 | Temp 97.5°F | Resp 20 | Wt 203.2 lb

## 2017-06-08 DIAGNOSIS — I350 Nonrheumatic aortic (valve) stenosis: Secondary | ICD-10-CM | POA: Insufficient documentation

## 2017-06-08 DIAGNOSIS — R011 Cardiac murmur, unspecified: Secondary | ICD-10-CM | POA: Diagnosis not present

## 2017-06-08 DIAGNOSIS — K219 Gastro-esophageal reflux disease without esophagitis: Secondary | ICD-10-CM | POA: Insufficient documentation

## 2017-06-08 DIAGNOSIS — G4730 Sleep apnea, unspecified: Secondary | ICD-10-CM | POA: Insufficient documentation

## 2017-06-08 DIAGNOSIS — R531 Weakness: Secondary | ICD-10-CM | POA: Diagnosis not present

## 2017-06-08 DIAGNOSIS — Z79899 Other long term (current) drug therapy: Secondary | ICD-10-CM | POA: Diagnosis not present

## 2017-06-08 DIAGNOSIS — C61 Malignant neoplasm of prostate: Secondary | ICD-10-CM | POA: Diagnosis not present

## 2017-06-08 DIAGNOSIS — R351 Nocturia: Secondary | ICD-10-CM | POA: Diagnosis not present

## 2017-06-08 DIAGNOSIS — Z7982 Long term (current) use of aspirin: Secondary | ICD-10-CM | POA: Diagnosis not present

## 2017-06-08 DIAGNOSIS — Z87891 Personal history of nicotine dependence: Secondary | ICD-10-CM | POA: Insufficient documentation

## 2017-06-08 DIAGNOSIS — R350 Frequency of micturition: Secondary | ICD-10-CM | POA: Insufficient documentation

## 2017-06-08 DIAGNOSIS — G629 Polyneuropathy, unspecified: Secondary | ICD-10-CM | POA: Insufficient documentation

## 2017-06-08 SURGERY — ULTRASOUND, PROSTATE, FOR VOLUME DETERMINATION
Anesthesia: Choice

## 2017-06-08 NOTE — Progress Notes (Signed)
History physicalfor I-125 interstitial implant  Name: Gregory Black  MRN: [id number]  Date:   06/08/2017     DOB: [birth year]   This 72 y.o. male patient presents to the hospital for volume studyand history and physical in preparation of I-125 interstitial implant for stage IIB adenocarcinoma the prostate  REFERRING PHYSICIAN: Kirk Ruths, MD  CHIEF COMPLAINT:  Chief Complaint  Patient presents with  . Prostate Cancer    Pt is here post volume study    DIAGNOSIS: The encounter diagnosis was Malignant neoplasm of prostate ([place]).   PREVIOUS INVESTIGATIONS:  Notes reviewed  HPI:  Patient is a 72 year old male who has a slowly rising PSA first detected at 5.1 back in January 2017 most recently November 2018 7.4. In May 2018 he had prostate biopsy showing 1 core with high-grade PIN. He underwent a prostate MRI showing a a 1.2 cm lesion of the right apical transitional cell BI-RADS 3. He MRI fusion biopsy showing 1 core positive for Gleason 7 (3+4) adenocarcinoma. He also had 4 cores positive for Gleason 6 adenocarcinoma. Patient does have frequency urgency and nocturia 2. He has had treatment options discussed with urology patient is leaning towards I-125 interstitial implant. He is seen today for consultation. He does have some comorbidities including aortic stenosis, sleep apnea and some bilateral lower extremity weakness.he is seen today for volume study in preparation for I-125 interstitial implant.    PLANNED TREATMENT REGIMEN: I-125 interstitial implant  PAST MEDICAL HISTORY:  has a past medical history of Aortic stenosis (07/27/14), Arthritis, Bulging lumbar disc, Cardiac murmur, History of concussion, Neuropathy, Reflux, Sleep apnea, Weakness of both legs, and Wears dentures.    PAST SURGICAL HISTORY:  Past Surgical History:  Procedure Laterality Date  . CATARACT EXTRACTION Left 2015  . COLONOSCOPY    . COLONOSCOPY WITH PROPOFOL N/A 05/17/2015   Procedure: COLONOSCOPY  WITH PROPOFOL;  Surgeon: Lucilla Lame, MD;  Location: [facility];  Service: Endoscopy;  Laterality: N/A;  . KNEE ARTHROSCOPY W/ MENISCAL REPAIR Right   . TONSILLECTOMY AND ADENOIDECTOMY      FAMILY HISTORY: family history includes Arthritis in his father; Cancer in his brother and father; Diabetes in his brother and mother; Heart disease in his mother; Hypertension in his mother; Lung disease in his brother; Stroke in his mother.  SOCIAL HISTORY:  reports that he has quit smoking. he has never used smokeless tobacco. He reports that he drinks about 0.6 oz of alcohol per week. He reports that he does not use drugs.  ALLERGIES: Pollen extract and Vicodin [hydrocodone-acetaminophen]  MEDICATIONS:  Current Outpatient Medications  Medication Sig Dispense Refill  . aspirin 81 MG tablet Take 81 mg by mouth every other day.     Marland Kitchen CALCIUM PO Take 1 tablet by mouth once a week.    . Multiple Vitamin (MULTIVITAMIN) tablet Take 1 tablet by mouth daily.    . Omega-3 Fatty Acids (FISH OIL) 500 MG CAPS Take 500 mg by mouth daily.     Vladimir Faster Glycol-Propyl Glycol (SYSTANE OP) Place 1 drop into both eyes daily as needed (for dry/red eyes).    . vitamin B-12 (CYANOCOBALAMIN) 1000 MCG tablet Take 1,000 mcg by mouth daily.     No current facility-administered medications for this encounter.     ECOG PERFORMANCE STATUS:  0 - Asymptomatic  REVIEW OF SYSTEMS:  Patient denies any weight loss, fatigue, weakness, fever, chills or night sweats. Patient denies any loss of vision, blurred vision. Patient denies  any ringing  of the ears or hearing loss. No irregular heartbeat. Patient denies heart murmur or history of fainting. Patient denies any chest pain or pain radiating to her upper extremities. Patient denies any shortness of breath, difficulty breathing at night, cough or hemoptysis. Patient denies any swelling in the lower legs. Patient denies any nausea vomiting, vomiting of blood, or coffee ground  material in the vomitus. Patient denies any stomach pain. Patient states has had normal bowel movements no significant constipation or diarrhea. Patient denies any dysuria, hematuria or significant nocturia. Patient denies any problems walking, swelling in the joints or loss of balance. Patient denies any skin changes, loss of hair or loss of weight. Patient denies any excessive worrying or anxiety or significant depression. Patient denies any problems with insomnia. Patient denies excessive thirst, polyuria, polydipsia. Patient denies any swollen glands, patient denies easy bruising or easy bleeding. Patient denies any recent infections, allergies or URI. Patient "s visual fields have not changed significantly in recent time.    PHYSICAL EXAM: BP (!) 166/83   Pulse (!) 47   Temp (!) 97.5 F (36.4 C)   Resp 20   Wt 203 lb 2.5 oz (92.1 kg)   BMI 29.15 kg/m  Well-developed well-nourished patient in NAD. HEENT reveals PERLA, EOMI, discs not visualized.  Oral cavity is clear. No oral mucosal lesions are identified. Neck is clear without evidence of cervical or supraclavicular adenopathy. Lungs are clear to A&P. Cardiac examination is essentially unremarkable with regular rate and rhythm without murmur rub or thrill. Abdomen is benign with no organomegaly or masses noted. Motor sensory and DTR levels are equal and symmetric in the upper and lower extremities. Cranial nerves II through XII are grossly intact. Proprioception is intact. No peripheral adenopathy or edema is identified. No motor or sensory levels are noted. Crude visual fields are within normal range.  LABORATORY DATA: pathology reports reviewed    RADIOLOGY RESULTS:ultrasound used for volume study   IMPRESSION: stage IIB adenocarcinoma the prostate in 72 year old male for volume study today in anticipation of I-125 interstitial implant  PLAN: this time patient is taken to the OR for volume study. Please see volume study note. He  tolerated the procedure well. Risks and benefits of his seed implant were reviewed" including radiation safety precautions. Patient is cleared by radiation oncology for implant. Treatment planning will be performed on his current volume study.  I would like to take this opportunity to thank you for allowing me to participate in the care of your patient.Noreene Filbert, MD

## 2017-06-08 NOTE — Progress Notes (Signed)
Radiation Oncology Volume study Note  Name: Gregory Black   Date:   06/08/2017 MRN:  [id number] DOB: March 08, 1946    This 72 y.o. male presents to the hospital today for volume study in anticipation of I-125 interstitial implant . REFERRING PROVIDER: Kirk Ruths, MD  HPI: . Patient is a 72 year old male who has a slowly rising PSA first detected at 5.1 back in January 2017 most recently November 2018 7.4. In May 2018 he had prostate biopsy showing 1 core with high-grade PIN. He underwent a prostate MRI showing a a 1.2 cm lesion of the right apical transitional cell BI-RADS 3. He MRI fusion biopsy showing 1 core positive for Gleason 7 (3+4) adenocarcinoma. He also had 4 cores positive for Gleason 6 adenocarcinoma. Patient does have frequency urgency and nocturia 2. He has had treatment options discussed with urology patient is leaning towards I-125 interstitial implant. He is seen today for consultation. He does have some comorbidities including aortic stenosis, sleep apnea and some bilateral lower extremity weakness.atient was taken to the OR for volume study in anticipation of I-125 interstitial implant    COMPLICATIONS OF TREATMENT: none  FOLLOW UP COMPLIANCE: keeps appointments   PHYSICAL EXAM:  There were no vitals taken for this visit. Well-developed well-nourished patient in NAD. HEENT reveals PERLA, EOMI, discs not visualized.  Oral cavity is clear. No oral mucosal lesions are identified. Neck is clear without evidence of cervical or supraclavicular adenopathy. Lungs are clear to A&P. Cardiac examination is essentially unremarkable with regular rate and rhythm without murmur rub or thrill. Abdomen is benign with no organomegaly or masses noted. Motor sensory and DTR levels are equal and symmetric in the upper and lower extremities. Cranial nerves II through XII are grossly intact. Proprioception is intact. No peripheral adenopathy or edema is identified. No motor or sensory  levels are noted. Crude visual fields are within normal range.  RADIOLOGY RESULTS: ultrasound use for volume study  PLAN: Patient was taken to the cystoscopy suite in the OR. Patient was placed in the low lithotomy position. Foley catheter was placed. Trans-rectal ultrasound probe was inserted into the rectum and prostate seminal vesicles were visualized as well as bladder base. stepping images were performed on a 5 mm increments. Images will be placed in [facility] treatment planning system to determine seed placement coordinates for eventual I-125 interstitial implant. Images will be reviewed with the physics and dosimetry staff for final quality approval. I personally was present for the volume study and assisted in delineation of contour volumes.  At the end of the procedure Foley catheter was removed, rectal ultrasound probe was removed. Patient tolerated his procedures extremely well with no side effects or complaints. Patient has given appointment for interstitial implant date. Consent was signed today as well as history and physical performed in preparation for his outpatient surgical implant.      Noreene Filbert, MD

## 2017-06-09 DIAGNOSIS — C61 Malignant neoplasm of prostate: Secondary | ICD-10-CM | POA: Diagnosis not present

## 2017-07-19 ENCOUNTER — Encounter
Admission: RE | Admit: 2017-07-19 | Discharge: 2017-07-19 | Disposition: A | Discharge status: Home / Self Care | Payer: Medicare HMO | Source: Ambulatory Visit | Attending: Urology | Admitting: Urology

## 2017-07-19 ENCOUNTER — Other Ambulatory Visit: Payer: Self-pay

## 2017-07-19 ENCOUNTER — Other Ambulatory Visit: Payer: Self-pay | Admitting: Radiology

## 2017-07-19 DIAGNOSIS — Z01812 Encounter for preprocedural laboratory examination: Secondary | ICD-10-CM | POA: Diagnosis not present

## 2017-07-19 DIAGNOSIS — R001 Bradycardia, unspecified: Secondary | ICD-10-CM | POA: Diagnosis not present

## 2017-07-19 DIAGNOSIS — Z01810 Encounter for preprocedural cardiovascular examination: Secondary | ICD-10-CM | POA: Insufficient documentation

## 2017-07-19 HISTORY — DX: Personal history of other diseases of the digestive system: Z87.19

## 2017-07-19 HISTORY — DX: Adverse effect of unspecified anesthetic, initial encounter: T41.45XA

## 2017-07-19 HISTORY — DX: Malignant (primary) neoplasm, unspecified: C80.1

## 2017-07-19 LAB — BASIC METABOLIC PANEL
Anion gap: 7 (ref 5–15)
BUN: 13 mg/dL (ref 6–20)
CALCIUM: 9.1 mg/dL (ref 8.9–10.3)
CO2: 30 mmol/L (ref 22–32)
CREATININE: 1.24 mg/dL (ref 0.61–1.24)
Chloride: 103 mmol/L (ref 101–111)
GFR calc Af Amer: >60 mL/min (ref >60)
GFR calc non Af Amer: 56 mL/min — ABNORMAL LOW (ref >60)
Glucose, Bld: 91 mg/dL (ref 65–99)
Potassium: 4.5 mmol/L (ref 3.5–5.1)
SODIUM: 140 mmol/L (ref 135–145)

## 2017-07-19 LAB — CBC
HCT: 42.5 % (ref 40.0–52.0)
Hemoglobin: 14.5 g/dL (ref 13.0–18.0)
MCH: 32.6 pg (ref 26.0–34.0)
MCHC: 34.2 g/dL (ref 32.0–36.0)
MCV: 95.3 fL (ref 80.0–100.0)
PLATELETS: 194 10*3/uL (ref 150–440)
RBC: 4.46 MIL/uL (ref 4.40–5.90)
RDW: 13.1 % (ref 11.5–14.5)
WBC: 4.7 10*3/uL (ref 3.8–10.6)

## 2017-07-19 NOTE — Patient Instructions (Addendum)
Your procedure is scheduled on:  Monday, Jul 26, 2017 Report to Day Surgery on the 2nd floor of the [facility]. To find out your arrival time, please call [phone] between 1PM - 3PM on: Friday, Jul 23, 2017  REMEMBER: Instructions that are not followed completely may result in serious medical risk, up to and including death; or upon the discretion of your surgeon and anesthesiologist your surgery may need to be rescheduled.  Do not eat food after midnight the night before your procedure.  No gum chewing, lozengers or hard candies.  You may however, drink CLEAR liquids up to 2 hours before you are scheduled to arrive for your surgery. Do not drink anything within 2 hours of the start of your surgery.  Clear liquids include: - water  - apple juice without pulp - clear gatorade - black coffee or tea (Do NOT add anything to the coffee or tea) Do NOT drink anything that is not on this list.  No Alcohol for 24 hours before or after surgery.  No Smoking including e-cigarettes for 24 hours prior to surgery.  No chewable tobacco products for at least 6 hours prior to surgery.  No nicotine patches on the day of surgery.  On the morning of surgery brush your teeth with toothpaste and water, you may rinse your mouth with mouthwash if you wish. Do not swallow any toothpaste or mouthwash.  Notify your doctor if there is any change in your medical condition (cold, fever, infection).  Do not wear jewelry, make-up, hairpins, clips or nail polish.  Do not wear lotions, powders, or perfumes. You may wear deodorant.  Do not shave 48 hours prior to surgery. Men may shave face and neck.  Contacts and dentures may not be worn into surgery.  Do not bring valuables to the hospital, including drivers license, insurance or credit cards.  [facility] is not responsible for any belongings or valuables.   TAKE THESE MEDICATIONS THE MORNING OF SURGERY:  NONE  Fleets enema as directed.  Follow  recommendations from Cardiologist or PCP regarding stopping Aspirin. STOP NOW!  NOW!  Stop Anti-inflammatories (NSAIDS) such as Advil, Aleve, Ibuprofen, Motrin, Naproxen, Naprosyn and Aspirin based products such as Excedrin, Goodys Powder, BC Powder. (May take Tylenol or Acetaminophen if needed.)  NOW!  Stop ANY OVER THE COUNTER supplements until after surgery. ([place]) (May continue Vitamin B, and multivitamin.)  Wear comfortable clothing (specific to your surgery type) to the hospital.  Plan for stool softeners for home use.  If you are being discharged the day of surgery, you will not be allowed to drive home. You will need a responsible adult to drive you home and stay with you that night.   If you are taking public transportation, you will need to have a responsible adult with you. Please confirm with your physician that it is acceptable to use public transportation.   Please call [phone] if you have any questions about these instructions.

## 2017-07-25 MED ORDER — CIPROFLOXACIN IN D5W 400 MG/200ML IV SOLN
400.0000 mg | INTRAVENOUS | Status: AC
  Administered 2017-07-26: 400 mg via INTRAVENOUS

## 2017-07-26 ENCOUNTER — Ambulatory Visit
Admission: RE | Admit: 2017-07-26 | Discharge: 2017-07-26 | Disposition: A | Discharge status: Home / Self Care | Payer: Medicare HMO | Source: Ambulatory Visit | Attending: Urology | Admitting: Urology

## 2017-07-26 ENCOUNTER — Inpatient Hospital Stay
Admission: RE | Admit: 2017-07-26 | Discharge: 2017-07-26 | Disposition: A | Discharge status: Home / Self Care | Payer: Self-pay | Source: Ambulatory Visit | Attending: Radiation Oncology | Admitting: Radiation Oncology

## 2017-07-26 ENCOUNTER — Ambulatory Visit: Payer: Medicare HMO

## 2017-07-26 ENCOUNTER — Encounter
Admission: RE | Disposition: A | Discharge status: Home / Self Care | Payer: Self-pay | Source: Ambulatory Visit | Attending: Urology

## 2017-07-26 DIAGNOSIS — Z87891 Personal history of nicotine dependence: Secondary | ICD-10-CM | POA: Insufficient documentation

## 2017-07-26 DIAGNOSIS — R011 Cardiac murmur, unspecified: Secondary | ICD-10-CM | POA: Diagnosis not present

## 2017-07-26 DIAGNOSIS — I350 Nonrheumatic aortic (valve) stenosis: Secondary | ICD-10-CM | POA: Diagnosis not present

## 2017-07-26 DIAGNOSIS — C61 Malignant neoplasm of prostate: Secondary | ICD-10-CM | POA: Insufficient documentation

## 2017-07-26 DIAGNOSIS — Z885 Allergy status to narcotic agent status: Secondary | ICD-10-CM | POA: Insufficient documentation

## 2017-07-26 DIAGNOSIS — I739 Peripheral vascular disease, unspecified: Secondary | ICD-10-CM | POA: Insufficient documentation

## 2017-07-26 DIAGNOSIS — G4730 Sleep apnea, unspecified: Secondary | ICD-10-CM | POA: Diagnosis not present

## 2017-07-26 DIAGNOSIS — M170 Bilateral primary osteoarthritis of knee: Secondary | ICD-10-CM | POA: Insufficient documentation

## 2017-07-26 DIAGNOSIS — I2510 Atherosclerotic heart disease of native coronary artery without angina pectoris: Secondary | ICD-10-CM | POA: Diagnosis not present

## 2017-07-26 DIAGNOSIS — M19041 Primary osteoarthritis, right hand: Secondary | ICD-10-CM | POA: Diagnosis not present

## 2017-07-26 DIAGNOSIS — Z8249 Family history of ischemic heart disease and other diseases of the circulatory system: Secondary | ICD-10-CM | POA: Insufficient documentation

## 2017-07-26 DIAGNOSIS — E538 Deficiency of other specified B group vitamins: Secondary | ICD-10-CM | POA: Insufficient documentation

## 2017-07-26 DIAGNOSIS — M19042 Primary osteoarthritis, left hand: Secondary | ICD-10-CM | POA: Insufficient documentation

## 2017-07-26 DIAGNOSIS — Z7982 Long term (current) use of aspirin: Secondary | ICD-10-CM | POA: Diagnosis not present

## 2017-07-26 DIAGNOSIS — Z79899 Other long term (current) drug therapy: Secondary | ICD-10-CM | POA: Diagnosis not present

## 2017-07-26 HISTORY — PX: RADIOACTIVE SEED IMPLANT: SHX5150

## 2017-07-26 SURGERY — INSERTION, RADIATION SOURCE, PROSTATE
Anesthesia: General | Site: Prostate | Wound class: Clean Contaminated

## 2017-07-26 MED ORDER — ONDANSETRON HCL 4 MG/2ML IJ SOLN
INTRAMUSCULAR | Status: AC
  Filled 2017-07-26: qty 2

## 2017-07-26 MED ORDER — ONDANSETRON HCL 4 MG/2ML IJ SOLN: 4.0000 mg | Freq: Once | INTRAMUSCULAR | Status: DC | PRN

## 2017-07-26 MED ORDER — SUGAMMADEX SODIUM 200 MG/2ML IV SOLN
INTRAVENOUS | Status: AC
  Filled 2017-07-26: qty 2

## 2017-07-26 MED ORDER — HYDROCODONE-ACETAMINOPHEN 5-325 MG PO TABS: 1.0000 | ORAL_TABLET | Freq: Four times a day (QID) | ORAL | 0 refills | Status: DC | PRN

## 2017-07-26 MED ORDER — DOCUSATE SODIUM 100 MG PO CAPS: 100.0000 mg | ORAL_CAPSULE | Freq: Two times a day (BID) | ORAL | 0 refills | Status: DC | PRN

## 2017-07-26 MED ORDER — BACITRACIN ZINC 500 UNIT/GM EX OINT
TOPICAL_OINTMENT | CUTANEOUS | Status: AC
  Filled 2017-07-26: qty 28.35

## 2017-07-26 MED ORDER — FAMOTIDINE 20 MG PO TABS
20.0000 mg | ORAL_TABLET | Freq: Once | ORAL | Status: AC
  Administered 2017-07-26: 20 mg via ORAL

## 2017-07-26 MED ORDER — FENTANYL CITRATE (PF) 100 MCG/2ML IJ SOLN
INTRAMUSCULAR | Status: AC
  Filled 2017-07-26: qty 2

## 2017-07-26 MED ORDER — LIDOCAINE HCL (PF) 2 % IJ SOLN
INTRAMUSCULAR | Status: AC
  Filled 2017-07-26: qty 10

## 2017-07-26 MED ORDER — METHYLPREDNISOLONE SODIUM SUCC 125 MG IJ SOLR
INTRAMUSCULAR | Status: AC
  Filled 2017-07-26: qty 2

## 2017-07-26 MED ORDER — CIPROFLOXACIN HCL 500 MG PO TABS: 500.0000 mg | ORAL_TABLET | Freq: Two times a day (BID) | ORAL | 0 refills | Status: DC

## 2017-07-26 MED ORDER — FENTANYL CITRATE (PF) 100 MCG/2ML IJ SOLN: 25.0000 ug | INTRAMUSCULAR | Status: DC | PRN

## 2017-07-26 MED ORDER — ACETAMINOPHEN 10 MG/ML IV SOLN
INTRAVENOUS | Status: AC
  Filled 2017-07-26: qty 100

## 2017-07-26 MED ORDER — ROCURONIUM BROMIDE 50 MG/5ML IV SOLN
INTRAVENOUS | Status: AC
  Filled 2017-07-26: qty 1

## 2017-07-26 MED ORDER — PROPOFOL 10 MG/ML IV BOLUS
INTRAVENOUS | Status: DC | PRN
  Administered 2017-07-26: 160 mg via INTRAVENOUS

## 2017-07-26 MED ORDER — FENTANYL CITRATE (PF) 100 MCG/2ML IJ SOLN
INTRAMUSCULAR | Status: DC | PRN
  Administered 2017-07-26 (×4): 25 ug via INTRAVENOUS

## 2017-07-26 MED ORDER — FLEET ENEMA 7-19 GM/118ML RE ENEM: 1.0000 | ENEMA | Freq: Once | RECTAL | Status: DC

## 2017-07-26 MED ORDER — DEXAMETHASONE SODIUM PHOSPHATE 10 MG/ML IJ SOLN
INTRAMUSCULAR | Status: AC
  Filled 2017-07-26: qty 1

## 2017-07-26 MED ORDER — DEXAMETHASONE SODIUM PHOSPHATE 10 MG/ML IJ SOLN
INTRAMUSCULAR | Status: DC | PRN
  Administered 2017-07-26: 5 mg via INTRAVENOUS

## 2017-07-26 MED ORDER — FAMOTIDINE 20 MG PO TABS
ORAL_TABLET | ORAL | Status: AC
  Administered 2017-07-26: 20 mg via ORAL
  Filled 2017-07-26: qty 1

## 2017-07-26 MED ORDER — HYDROMORPHONE HCL 1 MG/ML IJ SOLN
INTRAMUSCULAR | Status: AC
  Filled 2017-07-26: qty 1

## 2017-07-26 MED ORDER — BACITRACIN 500 UNIT/GM EX OINT
TOPICAL_OINTMENT | CUTANEOUS | Status: DC | PRN
  Administered 2017-07-26: 1 via TOPICAL

## 2017-07-26 MED ORDER — MIDAZOLAM HCL 5 MG/5ML IJ SOLN
INTRAMUSCULAR | Status: DC | PRN
  Administered 2017-07-26: 2 mg via INTRAVENOUS

## 2017-07-26 MED ORDER — ONDANSETRON HCL 4 MG/2ML IJ SOLN
INTRAMUSCULAR | Status: DC | PRN
  Administered 2017-07-26: 4 mg via INTRAVENOUS

## 2017-07-26 MED ORDER — CIPROFLOXACIN IN D5W 400 MG/200ML IV SOLN
INTRAVENOUS | Status: AC
  Filled 2017-07-26: qty 200

## 2017-07-26 MED ORDER — SUCCINYLCHOLINE CHLORIDE 20 MG/ML IJ SOLN
INTRAMUSCULAR | Status: AC
  Filled 2017-07-26: qty 1

## 2017-07-26 MED ORDER — EPHEDRINE SULFATE 50 MG/ML IJ SOLN
INTRAMUSCULAR | Status: DC | PRN
  Administered 2017-07-26 (×3): 5 mg via INTRAVENOUS

## 2017-07-26 MED ORDER — LACTATED RINGERS IV SOLN
INTRAVENOUS | Status: DC
  Administered 2017-07-26: 07:00:00 via INTRAVENOUS

## 2017-07-26 MED ORDER — PHENYLEPHRINE HCL 10 MG/ML IJ SOLN
INTRAMUSCULAR | Status: DC | PRN
  Administered 2017-07-26 (×4): 50 ug via INTRAVENOUS

## 2017-07-26 MED ORDER — MIDAZOLAM HCL 2 MG/2ML IJ SOLN
INTRAMUSCULAR | Status: AC
  Filled 2017-07-26: qty 2

## 2017-07-26 MED ORDER — LIDOCAINE HCL (PF) 2 % IJ SOLN
INTRAMUSCULAR | Status: DC | PRN
  Administered 2017-07-26: 60 mg

## 2017-07-26 MED ORDER — GLYCOPYRROLATE 0.2 MG/ML IJ SOLN
INTRAMUSCULAR | Status: DC | PRN
Start: 2017-07-26 — End: 2017-07-26
  Administered 2017-07-26 (×2): 0.2 mg via INTRAVENOUS

## 2017-07-26 MED ORDER — TAMSULOSIN HCL 0.4 MG PO CAPS: 0.4000 mg | ORAL_CAPSULE | Freq: Every day | ORAL | 0 refills | Status: DC

## 2017-07-26 MED ORDER — PROPOFOL 10 MG/ML IV BOLUS
INTRAVENOUS | Status: AC
  Filled 2017-07-26: qty 20

## 2017-07-26 SURGICAL SUPPLY — 25 items
BAG URINE DRAINAGE (UROLOGICAL SUPPLIES) ×3 IMPLANT
BLADE CLIPPER SURG (BLADE) ×3 IMPLANT
CATH FOL 2WAY LX 16X5 (CATHETERS) ×6 IMPLANT
DRAPE INCISE 23X17 IOBAN STRL (DRAPES) ×2
DRAPE INCISE IOBAN 23X17 STRL (DRAPES) ×1 IMPLANT
DRAPE SHEET LG 3/4 BI-LAMINATE (DRAPES) ×3 IMPLANT
DRAPE TABLE BACK 80X90 (DRAPES) ×3 IMPLANT
DRAPE UNDER BUTTOCK W/FLU (DRAPES) ×3 IMPLANT
DRSG TELFA 3X8 NADH (GAUZE/BANDAGES/DRESSINGS) ×3 IMPLANT
GLOVE BIO SURGEON STRL SZ 6.5 (GLOVE) ×4 IMPLANT
GLOVE BIO SURGEON STRL SZ7.5 (GLOVE) ×6 IMPLANT
GLOVE BIO SURGEONS STRL SZ 6.5 (GLOVE) ×2
GOWN STRL REUS W/ TWL LRG LVL3 (GOWN DISPOSABLE) ×2 IMPLANT
GOWN STRL REUS W/ TWL XL LVL3 (GOWN DISPOSABLE) ×1 IMPLANT
GOWN STRL REUS W/TWL LRG LVL3 (GOWN DISPOSABLE) ×4
GOWN STRL REUS W/TWL XL LVL3 (GOWN DISPOSABLE) ×2
IV NS 1000ML (IV SOLUTION) ×2
IV NS 1000ML BAXH (IV SOLUTION) ×1 IMPLANT
KIT TURNOVER CYSTO (KITS) ×3 IMPLANT
PACK CYSTO AR (MISCELLANEOUS) ×3 IMPLANT
SET CYSTO W/LG BORE CLAMP LF (SET/KITS/TRAYS/PACK) ×3 IMPLANT
SURGILUBE 2OZ TUBE FLIPTOP (MISCELLANEOUS) ×3 IMPLANT
SYR 10ML LL (SYRINGE) ×3 IMPLANT
SYRINGE IRR TOOMEY STRL 70CC (SYRINGE) IMPLANT
WATER STERILE IRR 1000ML POUR (IV SOLUTION) ×3 IMPLANT

## 2017-07-26 NOTE — Discharge Instructions (Signed)
Indwelling Urinary Catheter Care, Adult Take good care of your catheter to keep it working and to prevent problems. How to wear your catheter Attach your catheter to your leg with tape (adhesive tape) or a leg strap. Make sure it is not too tight. If you use tape, remove any bits of tape that are already on the catheter. How to wear a drainage bag You should have:  A large overnight bag.  A small leg bag.  Overnight Bag You may wear the overnight bag at any time. Always keep the bag below the level of your bladder but off the floor. When you sleep, put a clean plastic bag in a wastebasket. Then hang the bag inside the wastebasket. Leg Bag Never wear the leg bag at night. Always wear the leg bag below your knee. Keep the leg bag secure with a leg strap or tape. How to care for your skin  Clean the skin around the catheter at least once every day.  Shower every day. Do not take baths.  Put creams, lotions, or ointments on your genital area only as told by your doctor.  Do not use powders, sprays, or lotions on your genital area. How to clean your catheter and your skin 1. Wash your hands with soap and water. 2. Wet a washcloth in warm water and gentle (mild) soap. 3. Use the washcloth to clean the skin where the catheter enters your body. Clean downward and wipe away from the catheter in small circles. Do not wipe toward the catheter. 4. Pat the area dry with a clean towel. Make sure to clean off all soap. How to care for your drainage bags Empty your drainage bag when it is ?- full or at least 2-3 times a day. Replace your drainage bag once a month or sooner if it starts to smell bad or look dirty. Do not clean your drainage bag unless told by your doctor. Emptying a drainage bag  Supplies Needed  Rubbing alcohol.  Gauze pad or cotton ball.  Tape or a leg strap.  Steps 1. Wash your hands with soap and water. 2. Separate (detach) the bag from your leg. 3. Hold the bag over  the toilet or a clean container. Keep the bag below your hips and bladder. This stops pee (urine) from going back into the tube. 4. Open the pour spout at the bottom of the bag. 5. Empty the pee into the toilet or container. Do not let the pour spout touch any surface. 6. Put rubbing alcohol on a gauze pad or cotton ball. 7. Use the gauze pad or cotton ball to clean the pour spout. 8. Close the pour spout. 9. Attach the bag to your leg with tape or a leg strap. 10. Wash your hands.  Changing a drainage bag Supplies Needed  Alcohol wipes.  A clean drainage bag.  Adhesive tape or a leg strap.  Steps 1. Wash your hands with soap and water. 2. Separate the dirty bag from your leg. 3. Pinch the rubber catheter with your fingers so that pee does not spill out. 4. Separate the catheter tube from the drainage tube where these tubes connect (at the connection valve). Do not let the tubes touch any surface. 5. Clean the end of the catheter tube with an alcohol wipe. Use a different alcohol wipe to clean the end of the drainage tube. 6. Connect the catheter tube to the drainage tube of the clean bag. 7. Attach the new bag to  the leg with adhesive tape or a leg strap. 8. Wash your hands.  How to prevent infection and other problems  Never pull on your catheter or try to remove it. Pulling can damage tissue in your body.  Always wash your hands before and after touching your catheter.  If a leg strap gets wet, replace it with a dry one.  Drink enough fluids to keep your pee clear or pale yellow, or as told by your doctor.  Do not let the drainage bag or tubing touch the floor.  Wear cotton underwear.  If you are male, wipe from front to back after you poop (have a bowel movement).  Check on the catheter often to make sure it works and the tubing is not twisted. Get help if:  Your pee is cloudy.  Your pee smells unusually bad.  Your pee is not draining into the bag.  Your  tube gets clogged.  Your catheter starts to leak.  Your bladder feels full. Get help right away if:  You have redness, swelling, or pain where the catheter enters your body.  You have fluid, pus, or a bad smell coming from the area where the catheter enters your body.  The area where the catheter enters your body feels warm.  You have a fever.  You have pain in your: ? Stomach (abdomen). ? Legs. ? Lower back. ? Bladder.  You see blood fill the catheter.  Your pee is pink or red.  You feel sick to your stomach (nauseous).  You throw up (vomit).  You have chills.  Your catheter gets pulled out. This information is not intended to replace advice given to you by your health care provider. Make sure you discuss any questions you have with your health care provider. Document Released: 07/04/2012 Document Revised: 02/05/2016 Document Reviewed: 08/22/2013 Elsevier Interactive Patient Education  2018 [place] for Prostate Cancer, Care After Refer to this sheet in the next few weeks. These instructions provide you with information on caring for yourself after your procedure. Your health care provider may also give you more specific instructions. Your treatment has been planned according to current medical practices, but problems sometimes occur. Call your health care provider if you have any problems or questions after your procedure. What can I expect after the procedure? The area behind the scrotum will probably be tender and bruised. For a short period of time you may have:  Difficulty passing urine. You may need a catheter for a few days to a month.  Blood in the urine or semen.  A feeling of constipation because of prostate swelling.  Frequent feeling of an urgent need to urinate.  For a long period of time you may have:  Inflammation of the rectum. This happens in about 2% of people who have the procedure.  Erection problems. These vary with age and  occur in about 15-40% of men.  Difficulty urinating. This is caused by scarring in the urethra.  Diarrhea.  Follow these instructions at home:  Take medicines only as directed by your health care provider.  You will probably have a catheter in your bladder for several days. You will have blood in the urine bag and should drink a lot of fluids to keep it a light red color.  Keep all follow-up visits as directed by your health care provider. If you have a catheter, it will be removed during one of these visits.  Try not to sit directly on the area  behind the scrotum. A soft cushion can decrease the discomfort. Ice packs may also be helpful for the discomfort. Do not put ice directly on the skin.  Shower and wash the area behind the scrotum gently. Do not sit in a tub.  If you have had the brachytherapy that uses the seeds, limit your close contact with children and pregnant women for 2 months because of the radiation still in the prostate. After that period of time, the levels drop off quickly. Get help right away if:  You have a fever.  You have chills.  You have shortness of breath.  You have chest pain.  You have thick blood, like tomato juice, in the urine bag.  Your catheter is blocked so urine cannot get into the bag. Your bladder area or lower abdomen may be swollen.  There is excessive bleeding from your rectum. It is normal to have a little blood mixed with your stool.  There is severe discomfort in the treated area that does not go away with pain medicine.  You have abdominal discomfort.  You have severe nausea or vomiting.  You develop any new or unusual symptoms. This information is not intended to replace advice given to you by your health care provider. Make sure you discuss any questions you have with your health care provider. Document Released: 04/11/2010 Document Revised: 08/21/2015 Document Reviewed: 08/30/2012 Elsevier Interactive Patient Education  2017  [place]   1) The drugs that you were given will stay in your system until tomorrow so for the next 24 hours you should not:  A) Drive an automobile B) Make any legal decisions C) Drink any alcoholic beverage   2) You may resume regular meals tomorrow.  Today it is better to start with liquids and gradually work up to solid foods.  You may eat anything you prefer, but it is better to start with liquids, then soup and crackers, and gradually work up to solid foods.   3) Please notify your doctor immediately if you have any unusual bleeding, trouble breathing, redness and pain at the surgery site, drainage, fever, or pain not relieved by medication.    4) Additional Instructions:        Please contact your physician with any problems or Same Day Surgery at [phone], Monday through Friday 6 am to 4 pm, or [place] at [facility] number at [phone].

## 2017-07-26 NOTE — Progress Notes (Signed)
Radiation Oncology I-125 interstitial implant note  Name: Gregory Black   Date:   05/17/2017 MRN:  [id number] DOB: [birth year]    This 72 y.o. male presents to the 04 today for I-125 interstitial implant for adenocarcinoma the prostate  REFERRING PROVIDER: No ref. provider found  HPI: patient is a 72 year old male admitted today.for I-125 interstitial implant for Gleason 7 (3+4) adenocarcinoma the prostate. Patient does have comorbidities including aortic stenosis sleep apnea and some lower extremity weakness. He is oriented on volume study in anticipation of implant.  COMPLICATIONS OF TREATMENT: none  FOLLOW UP COMPLIANCE: keeps appointments   PHYSICAL EXAM:  BP (!) 148/82   Pulse (!) 48   Temp 97.6 F (36.4 C) (Temporal)   Resp 14   SpO2 100%  Well-developed well-nourished patient in NAD. HEENT reveals PERLA, EOMI, discs not visualized.  Oral cavity is clear. No oral mucosal lesions are identified. Neck is clear without evidence of cervical or supraclavicular adenopathy. Lungs are clear to A&P. Cardiac examination is essentially unremarkable with regular rate and rhythm without murmur rub or thrill. Abdomen is benign with no organomegaly or masses noted. Motor sensory and DTR levels are equal and symmetric in the upper and lower extremities. Cranial nerves II through XII are grossly intact. Proprioception is intact. No peripheral adenopathy or edema is identified. No motor or sensory levels are noted. Crude visual fields are within normal range.  RADIOLOGY RESULTS: ultrasound plain films use for procedure  PLAN: patient was taken to the operating room and general anesthesia was administered. Legs were immobilized in stirrups and patient was positioned in the exact same proportions as original volume study. Patient was prepped and Foley catheter was placed. Ultrasound guidance identified the prostate and recreated the original set up as per treatment planning volume study. 29 needles  were placed under ultrasound guidance with PVCs delivered to the prostate volume. After completion of procedure cystoscopy was performed by urology and no evidence of seeds in the bladder were noted. Patient tolerated the procedure extremely well. Initial plain film as doublecheck identified 89 seeds in the prostate. Patient has followup appointment in one month for CT scan for quality assurance will be performed.     Noreene Filbert, MD

## 2017-07-26 NOTE — Anesthesia Postprocedure Evaluation (Signed)
Anesthesia Post Note  Patient: Gregory Black  Procedure(s) Performed: RADIOACTIVE SEED IMPLANT/BRACHYTHERAPY IMPLANT (N/A Prostate)  Patient location during evaluation: PACU Anesthesia Type: General Level of consciousness: awake and alert Pain management: pain level controlled Vital Signs Assessment: post-procedure vital signs reviewed and stable Respiratory status: spontaneous breathing, nonlabored ventilation, respiratory function stable and patient connected to nasal cannula oxygen Cardiovascular status: blood pressure returned to baseline and stable Postop Assessment: no apparent nausea or vomiting Anesthetic complications: no     Last Vitals:  Vitals:   07/26/17 1021 07/26/17 1038  BP: 131/74 (!) 148/82  Pulse: (!) 43 (!) 48  Resp: 14 14  Temp: 36.4 C   SpO2: 100% 100%    Last Pain:  Vitals:   07/26/17 1021  TempSrc: Temporal  PainSc: 0-No pain                 Molli Barrows

## 2017-07-26 NOTE — Anesthesia Procedure Notes (Signed)
Procedure Name: LMA Insertion Date/Time: 07/26/2017 8:00 AM Performed by: Rolla Plate, CRNA Pre-anesthesia Checklist: Patient identified, Emergency Drugs available, Suction available, Patient being monitored and Timeout performed Patient Re-evaluated:Patient Re-evaluated prior to induction Oxygen Delivery Method: Circle system utilized Preoxygenation: Pre-oxygenation with 100% oxygen Induction Type: IV induction Ventilation: Mask ventilation without difficulty LMA: LMA inserted LMA Size: 4.5 Number of attempts: 1 Dental Injury: Teeth and Oropharynx as per pre-operative assessment

## 2017-07-26 NOTE — Op Note (Signed)
Preoperative diagnosis: Adenocarcinoma of the prostate   Postoperative diagnosis: Same   Procedure: I-125 prostate seed implantation, cystoscopy  Surgeon: Hollice Espy M.D. ,  Radiation oncology:  Lavena Stanford, M.D.   Anesthesia: Spinal  YQIHKV:42 Fr Foley  Complications: none  Indications: Intermediate risk prostate cancer  Procedure: Patient was brought to operating suite and placement table in the supine position. At this time, a universal timeout protocol was performed, all team members were identified, Venodyne boots are placed, and he was administered IV Ancef in the preoperative period. He was placed in lithotomy position and prepped and draped in usual manner. Radiation oncology department placed a transrectal ultrasound probe anchoring stand/ grid and aligned with previous imaging from the volume study. Foley catheter was inserted without difficulty.  All needle passage was done with real-time transrectal ultrasound guidance in both the transverse and sagittal plains in order to achieve the desired preplanned position. A total of 29 needles were placed.  89 active seeds were implanted. The Foley catheter was removed and a rigid cystoscopy failed to show any seeds outside the prostate without evidence of trauma to the urethral, prostatic fossa, or bladder.  The bladder was drained.  A fluoroscopic image was then obtained showing excellent distrubution of the brachytherapy seeds.  Each seed was counted and counts were correct.  A 16 French Foley catheter was replaced at the end of the case of blueness, 10 cc of sterile water.  The patient was then repositioned in the supine position, reversed from anesthesia, and taken to the PACU in stable condition.  Plan: Patient will be discharged home with a Foley for overnight in the setting of spinal anesthesia and prostate manipulation.  Plan for voiding trial tomorrow.

## 2017-07-26 NOTE — Transfer of Care (Signed)
Immediate Anesthesia Transfer of Care Note  Patient: Gregory Black  Procedure(s) Performed: RADIOACTIVE SEED IMPLANT/BRACHYTHERAPY IMPLANT (N/A Prostate)  Patient Location: PACU  Anesthesia Type:General  Level of Consciousness: awake, alert  and oriented  Airway & Oxygen Therapy: Patient Spontanous Breathing and Patient connected to face mask oxygen  Post-op Assessment: Report given to RN and Post -op Vital signs reviewed and stable  Post vital signs: Reviewed and stable  Last Vitals:  Vitals Value Taken Time  BP 107/72 07/26/2017  9:14 AM  Temp 36.3 C 07/26/2017  9:14 AM  Pulse 71 07/26/2017  9:15 AM  Resp 8 07/26/2017  9:15 AM  SpO2 100 % 07/26/2017  9:15 AM  Vitals shown include unvalidated device data.  Last Pain:  Vitals:   07/26/17 0914  TempSrc: Temporal  PainSc:          Complications: No apparent anesthesia complications

## 2017-07-26 NOTE — Anesthesia Preprocedure Evaluation (Signed)
Anesthesia Evaluation  Patient identified by MRN, date of birth, ID band Patient awake    Reviewed: Allergy & Precautions, H&P , NPO status , Patient's Chart, lab work & pertinent test results, reviewed documented beta blocker date and time   History of Anesthesia Complications (+) history of anesthetic complications  Airway Mallampati: II   Neck ROM: full    Dental  (+) Upper Dentures, Lower Dentures   Pulmonary neg pulmonary ROS, former smoker,    Pulmonary exam normal        Cardiovascular Exercise Tolerance: Good + CAD and + Peripheral Vascular Disease  negative cardio ROS Normal cardiovascular exam+ Valvular Problems/Murmurs  Rhythm:regular Rate:Normal     Neuro/Psych negative neurological ROS  negative psych ROS   GI/Hepatic negative GI ROS, Neg liver ROS, hiatal hernia,   Endo/Other  negative endocrine ROS  Renal/GU Renal diseasenegative Renal ROS  negative genitourinary   Musculoskeletal   Abdominal   Peds  Hematology negative hematology ROS (+)   Anesthesia Other Findings Past Medical History: 07/27/14: Aortic stenosis     Comment:  Echo  No date: Arthritis     Comment:  hands, knees No date: Bulging lumbar disc 2019: Cancer ([place])     Comment:  prostate No date: Cardiac murmur No date: Complication of anesthesia     Comment:  during start of colonoscopy after medication given heart              rate dropped so procedure was stopped No date: History of concussion No date: History of hiatal hernia No date: Neuropathy No date: Reflux     Comment:  in past No date: Weakness of both legs     Comment:  pt says from Vit b12 deficiency No date: Wears dentures     Comment:  full upper and lower Past Surgical History: 2015: CATARACT EXTRACTION; Left No date: COLONOSCOPY 05/17/2015: COLONOSCOPY WITH PROPOFOL; N/A     Comment:  Procedure: COLONOSCOPY WITH PROPOFOL;  Surgeon: Lucilla Lame,  MD;  Location: [facility];  Service:               Endoscopy;  Laterality: N/A; No date: EYE SURGERY No date: KNEE ARTHROSCOPY W/ MENISCAL REPAIR; Right No date: TONSILLECTOMY No date: TONSILLECTOMY AND ADENOIDECTOMY   Reproductive/Obstetrics negative OB ROS                             Anesthesia Physical Anesthesia Plan  ASA: III  Anesthesia Plan: General and General LMA   Post-op Pain Management:    Induction:   PONV Risk Score and Plan:   Airway Management Planned:   Additional Equipment:   Intra-op Plan:   Post-operative Plan:   Informed Consent: I have reviewed the patients History and Physical, chart, labs and discussed the procedure including the risks, benefits and alternatives for the proposed anesthesia with the patient or authorized representative who has indicated his/her understanding and acceptance.   Dental Advisory Given  Plan Discussed with: CRNA  Anesthesia Plan Comments:         Anesthesia Quick Evaluation

## 2017-07-26 NOTE — Anesthesia Post-op Follow-up Note (Signed)
Anesthesia QCDR form completed.        

## 2017-07-26 NOTE — H&P (Signed)
07/26/17 [facility] CTAB   History physical for I-125 interstitial implant  Name: Gregory Black      MRN: [id number]        DOB: Aug 05, 1945   This 72 y.o. male patient presents to the hospital for volume studyand history and physical in preparation of I-125 interstitial implant for stage IIB adenocarcinoma the prostate  REFERRING PHYSICIAN: Kirk Ruths, MD  CHIEF COMPLAINT:      Chief Complaint  Patient presents with  . Prostate Cancer    Pt is here post volume study    DIAGNOSIS: The encounter diagnosis was Malignant neoplasm of prostate ([place]).   PREVIOUS INVESTIGATIONS:  Notes reviewed  HPI: Patient is a 72 year old male who has a slowly rising PSA first detected at 5.1 back in January 2017 most recently November 2018 7.4. In May 2018 he had prostate biopsy showing 1 core with high-grade PIN. He underwent a prostate MRI showing a a 1.2 cm lesion of the right apical transitional cell BI-RADS 3. He MRI fusion biopsy showing 1 core positive for Gleason 7 (3+4) adenocarcinoma. He also had 4 cores positive for Gleason 6 adenocarcinoma. Patient does have frequency urgency and nocturia 2. He has had treatment options discussed with urology patient is leaning towards I-125 interstitial implant. He is seen today for consultation. He does have some comorbidities including aortic stenosis, sleep apnea and some bilateral lower extremity weakness.he is seen today for volume study in preparation for I-125 interstitial implant.    PLANNED TREATMENT REGIMEN: I-125 interstitial implant  PAST MEDICAL HISTORY:  has a past medical history of Aortic stenosis (07/27/14), Arthritis, Bulging lumbar disc, Cardiac murmur, History of concussion, Neuropathy, Reflux, Sleep apnea, Weakness of both legs, and Wears dentures.    PAST SURGICAL HISTORY:       Past Surgical History:  Procedure Laterality Date  . CATARACT EXTRACTION Left 2015  . COLONOSCOPY    . COLONOSCOPY WITH PROPOFOL  N/A 05/17/2015   Procedure: COLONOSCOPY WITH PROPOFOL;  Surgeon: Lucilla Lame, MD;  Location: [facility];  Service: Endoscopy;  Laterality: N/A;  . KNEE ARTHROSCOPY W/ MENISCAL REPAIR Right   . TONSILLECTOMY AND ADENOIDECTOMY      FAMILY HISTORY: family history includes Arthritis in his father; Cancer in his brother and father; Diabetes in his brother and mother; Heart disease in his mother; Hypertension in his mother; Lung disease in his brother; Stroke in his mother.  SOCIAL HISTORY:  reports that he has quit smoking. he has never used smokeless tobacco. He reports that he drinks about 0.6 oz of alcohol per week. He reports that he does not use drugs.  ALLERGIES: Pollen extract and Vicodin [hydrocodone-acetaminophen]  MEDICATIONS:        Current Outpatient Medications  Medication Sig Dispense Refill  . aspirin 81 MG tablet Take 81 mg by mouth every other day.     Marland Kitchen CALCIUM PO Take 1 tablet by mouth once a week.    . Multiple Vitamin (MULTIVITAMIN) tablet Take 1 tablet by mouth daily.    . Omega-3 Fatty Acids (FISH OIL) 500 MG CAPS Take 500 mg by mouth daily.     Vladimir Faster Glycol-Propyl Glycol (SYSTANE OP) Place 1 drop into both eyes daily as needed (for dry/red eyes).    . vitamin B-12 (CYANOCOBALAMIN) 1000 MCG tablet Take 1,000 mcg by mouth daily.     No current facility-administered medications for this encounter.     ECOG PERFORMANCE STATUS:  0 - Asymptomatic  REVIEW OF SYSTEMS:  Patient denies any weight loss, fatigue, weakness, fever, chills or night sweats. Patient denies any loss of vision, blurred vision. Patient denies any ringing  of the ears or hearing loss. No irregular heartbeat. Patient denies heart murmur or history of fainting. Patient denies any chest pain or pain radiating to her upper extremities. Patient denies any shortness of breath, difficulty breathing at night, cough or hemoptysis. Patient denies any swelling in the lower legs.  Patient denies any nausea vomiting, vomiting of blood, or coffee ground material in the vomitus. Patient denies any stomach pain. Patient states has had normal bowel movements no significant constipation or diarrhea. Patient denies any dysuria, hematuria or significant nocturia. Patient denies any problems walking, swelling in the joints or loss of balance. Patient denies any skin changes, loss of hair or loss of weight. Patient denies any excessive worrying or anxiety or significant depression. Patient denies any problems with insomnia. Patient denies excessive thirst, polyuria, polydipsia. Patient denies any swollen glands, patient denies easy bruising or easy bleeding. Patient denies any recent infections, allergies or URI. Patient "s visual fields have not changed significantly in recent time.    PHYSICAL EXAM: BP (!) 166/83   Pulse (!) 47   Temp (!) 97.5 F (36.4 C)   Resp 20   Wt 203 lb 2.5 oz (92.1 kg)   BMI 29.15 kg/m  Well-developed well-nourished patient in NAD. HEENT reveals PERLA, EOMI, discs not visualized.  Oral cavity is clear. No oral mucosal lesions are identified. Neck is clear without evidence of cervical or supraclavicular adenopathy. Lungs are clear to A&P. Cardiac examination is essentially unremarkable with regular rate and rhythm without murmur rub or thrill. Abdomen is benign with no organomegaly or masses noted. Motor sensory and DTR levels are equal and symmetric in the upper and lower extremities. Cranial nerves II through XII are grossly intact. Proprioception is intact. No peripheral adenopathy or edema is identified. No motor or sensory levels are noted. Crude visual fields are within normal range.  LABORATORY DATA: pathology reports reviewed    RADIOLOGY RESULTS:ultrasound used for volume study   IMPRESSION: stage IIB adenocarcinoma the prostate in 72 year old male for volume study today in anticipation of I-125 interstitial implant  PLAN: this time patient  is taken to the OR for volume study. Please see volume study note. He tolerated the procedure well. Risks and benefits of his seed implant were reviewed" including radiation safety precautions. Patient is cleared by radiation oncology for implant. Treatment planning will be performed on his current volume study.  I would like to take this opportunity to thank you for allowing me to participate in the care of your patient.Noreene Filbert, MD  updated today by Hollice Espy, MD

## 2017-07-27 ENCOUNTER — Ambulatory Visit: Payer: Medicare HMO

## 2017-07-27 ENCOUNTER — Encounter: Payer: Self-pay | Admitting: Urology

## 2017-07-27 DIAGNOSIS — C61 Malignant neoplasm of prostate: Secondary | ICD-10-CM

## 2017-07-27 NOTE — Progress Notes (Signed)
Catheter Removal  Patient is present today for a catheter removal.  10 ml of water was drained from the balloon. A 18 FR foley cath was removed from the bladder no complications were noted . Patient tolerated well.  Preformed by: Gordy Clement, CMA   Follow up/ Additional notes: Advised pt to come back into office today if unable to urinate. Pt voiced understanding.

## 2017-08-24 ENCOUNTER — Other Ambulatory Visit: Payer: Self-pay

## 2017-08-24 ENCOUNTER — Encounter: Payer: Self-pay | Admitting: Radiation Oncology

## 2017-08-24 ENCOUNTER — Other Ambulatory Visit: Payer: Self-pay | Admitting: *Deleted

## 2017-08-24 ENCOUNTER — Ambulatory Visit
Admission: RE | Admit: 2017-08-24 | Discharge: 2017-08-24 | Disposition: A | Discharge status: Home / Self Care | Payer: Medicare HMO | Source: Ambulatory Visit | Attending: Radiation Oncology | Admitting: Radiation Oncology

## 2017-08-24 VITALS — BP 131/76 | HR 59 | Temp 96.7°F | Resp 20 | Wt 202.3 lb

## 2017-08-24 DIAGNOSIS — C61 Malignant neoplasm of prostate: Secondary | ICD-10-CM

## 2017-08-24 DIAGNOSIS — Z923 Personal history of irradiation: Secondary | ICD-10-CM | POA: Insufficient documentation

## 2017-08-24 DIAGNOSIS — Z510 Encounter for antineoplastic radiation therapy: Secondary | ICD-10-CM | POA: Insufficient documentation

## 2017-08-24 NOTE — Progress Notes (Signed)
Radiation Oncology Follow up Note  Name: Gregory Black   Date:   08/24/2017 MRN:  [id number] DOB: 08-14-1945    This 72 y.o. male presents to the clinic today for one-month follow-up status post I-125 interstitial implant for Gleason 7 adenocarcinoma the prostate.  REFERRING PROVIDER: Kirk Ruths, MD  HPI: patient is a 72 year old male now out 1 month having completed I-125 interstitial implant for Gleason 7 (3+4) adenocarcinoma the prostate. He is seen today in routine follow-up and is doing well he says he says has slight increased urgency and frequency and nocturia 2-3 which we expect. He has not had any hematuria.He had quality assurance films performed today showing excellent source placement..  COMPLICATIONS OF TREATMENT: none  FOLLOW UP COMPLIANCE: keeps appointments   PHYSICAL EXAM:  BP 131/76   Pulse (!) 59   Temp (!) 96.7 F (35.9 C)   Resp 20   Wt 202 lb 4.4 oz (91.7 kg)   BMI 29.02 kg/m  On rectal exam rectal sphincter tone is good. Prostate is smooth contracted without evidence of nodularity or mass. Sulcus is preserved bilaterally. No discrete nodularity is identified. No other rectal abnormalities are noted. Well-developed well-nourished patient in NAD. HEENT reveals PERLA, EOMI, discs not visualized.  Oral cavity is clear. No oral mucosal lesions are identified. Neck is clear without evidence of cervical or supraclavicular adenopathy. Lungs are clear to A&P. Cardiac examination is essentially unremarkable with regular rate and rhythm without murmur rub or thrill. Abdomen is benign with no organomegaly or masses noted. Motor sensory and DTR levels are equal and symmetric in the upper and lower extremities. Cranial nerves II through XII are grossly intact. Proprioception is intact. No peripheral adenopathy or edema is identified. No motor or sensory levels are noted. Crude visual fields are within normal range.  RADIOLOGY RESULTS: follow-up CT scan for quality  assurance shows excellent placement formal QA will be performed.  PLAN: present time patient is doing well recovering nicely from his radiation therapy implant. He is very little side effect profile. I'm please was overall progress. I've explained to him track his PSA over the next 3 monthsand this will be our gold standard indication of success of her treatments. Patient comprehend my treatment plan well.three-month follow-up with a PSA was ordered.  I would like to take this opportunity to thank you for allowing me to participate in the care of your patient.Noreene Filbert, MD

## 2017-08-31 DIAGNOSIS — C61 Malignant neoplasm of prostate: Secondary | ICD-10-CM | POA: Diagnosis not present

## 2017-08-31 DIAGNOSIS — Z510 Encounter for antineoplastic radiation therapy: Secondary | ICD-10-CM | POA: Diagnosis present

## 2017-09-01 DIAGNOSIS — Z510 Encounter for antineoplastic radiation therapy: Secondary | ICD-10-CM | POA: Diagnosis not present

## 2017-11-24 ENCOUNTER — Inpatient Hospital Stay: Payer: Medicare HMO | Attending: Radiation Oncology

## 2017-11-24 DIAGNOSIS — C61 Malignant neoplasm of prostate: Secondary | ICD-10-CM | POA: Diagnosis not present

## 2017-11-24 LAB — PSA: PROSTATIC SPECIFIC ANTIGEN: 2.47 ng/mL (ref 0.00–4.00)

## 2017-12-01 ENCOUNTER — Other Ambulatory Visit: Payer: Self-pay

## 2017-12-01 ENCOUNTER — Other Ambulatory Visit: Payer: Self-pay | Admitting: *Deleted

## 2017-12-01 ENCOUNTER — Encounter: Payer: Self-pay | Admitting: Radiation Oncology

## 2017-12-01 ENCOUNTER — Ambulatory Visit
Admission: RE | Admit: 2017-12-01 | Discharge: 2017-12-01 | Disposition: A | Discharge status: Home / Self Care | Payer: Medicare HMO | Source: Ambulatory Visit | Attending: Radiation Oncology | Admitting: Radiation Oncology

## 2017-12-01 VITALS — BP 133/63 | HR 39 | Temp 96.9°F | Resp 18 | Wt 197.0 lb

## 2017-12-01 DIAGNOSIS — C61 Malignant neoplasm of prostate: Secondary | ICD-10-CM

## 2017-12-01 DIAGNOSIS — Z923 Personal history of irradiation: Secondary | ICD-10-CM | POA: Insufficient documentation

## 2017-12-01 NOTE — Progress Notes (Signed)
.  Radiation Oncology Follow up Note  Name: Gregory Black   Date:   12/01/2017 MRN:  [id number] DOB: May 15, 1945    This 72 y.o. male presents to the clinic today for four-month follow-up status post I-125 interstitial implant for Gleason 7 (3+4) adenocarcinoma the prostate REFERRING PROVIDER: Kirk Ruths, MD  HPI: patient is a 72 year old male now seen out 4 months having completed I-125 interstitial implant for Gleason 7 (3+4) adenocarcinoma the prostate. Seen today in routine follow-up he is doing well deep frequency and urgency has subsided. He is having no further hematuria. His most recent PSA. Was 2.47.  COMPLICATIONS OF TREATMENT: none  FOLLOW UP COMPLIANCE: keeps appointments   PHYSICAL EXAM:  BP 133/63   Pulse (!) 39   Temp (!) 96.9 F (36.1 C) (Tympanic)   Resp 18   Wt 196 lb 15.7 oz (89.4 kg)   BMI 28.26 kg/m  On rectal exam rectal sphincter tone is good. Prostate is smooth contracted without evidence of nodularity or mass. Sulcus is preserved bilaterally. No discrete nodularity is identified. No other rectal abnormalities are noted.Well-developed well-nourished patient in NAD. HEENT reveals PERLA, EOMI, discs not visualized.  Oral cavity is clear. No oral mucosal lesions are identified. Neck is clear without evidence of cervical or supraclavicular adenopathy. Lungs are clear to A&P. Cardiac examination is essentially unremarkable with regular rate and rhythm without murmur rub or thrill. Abdomen is benign with no organomegaly or masses noted. Motor sensory and DTR levels are equal and symmetric in the upper and lower extremities. Cranial nerves II through XII are grossly intact. Proprioception is intact. No peripheral adenopathy or edema is identified. No motor or sensory levels are noted. Crude visual fields are within normal range.  RADIOLOGY RESULTS: no current films for review  PLAN: present time he is doingwell.Marland Kitchen His initial PSA was 7.4 has come down to the 2  range at this time. I have explained to him the PSA can balance in the first year of post seed implant. He is a very low side effect profile. I've asked to see him back in 6 months for follow-up and will obtain a PSA prior to that visit. Patient is to call with any concerns.  I would like to take this opportunity to thank you for allowing me to participate in the care of your patient.Noreene Filbert, MD

## 2018-06-01 ENCOUNTER — Other Ambulatory Visit: Payer: Self-pay

## 2018-06-01 ENCOUNTER — Inpatient Hospital Stay: Payer: Medicare HMO | Attending: Radiation Oncology

## 2018-06-01 DIAGNOSIS — C61 Malignant neoplasm of prostate: Secondary | ICD-10-CM | POA: Diagnosis present

## 2018-06-01 LAB — PSA: Prostatic Specific Antigen: 1.71 ng/mL (ref 0.00–4.00)

## 2018-06-07 ENCOUNTER — Other Ambulatory Visit: Payer: Self-pay

## 2018-06-08 ENCOUNTER — Other Ambulatory Visit: Payer: Self-pay | Admitting: *Deleted

## 2018-06-08 ENCOUNTER — Ambulatory Visit
Admission: RE | Admit: 2018-06-08 | Discharge: 2018-06-08 | Disposition: A | Discharge status: Home / Self Care | Payer: Medicare HMO | Source: Ambulatory Visit | Attending: Radiation Oncology | Admitting: Radiation Oncology

## 2018-06-08 VITALS — BP 132/78 | HR 82 | Temp 97.5°F | Resp 20 | Wt 202.4 lb

## 2018-06-08 DIAGNOSIS — Z923 Personal history of irradiation: Secondary | ICD-10-CM | POA: Insufficient documentation

## 2018-06-08 DIAGNOSIS — C61 Malignant neoplasm of prostate: Secondary | ICD-10-CM

## 2018-06-08 NOTE — Progress Notes (Signed)
Radiation Oncology Follow up Note  Name: Gregory Black   Date:   06/08/2018 MRN:  [id number] DOB: [birth year]    This 73 y.o. male presents to the clinic today for ten-month follow-up status post I-125 interstitial implant for Gleason 7 adenocarcinoma the prostate.  REFERRING PROVIDER: Kirk Ruths, MD  HPI: patient is a 73 year old male now out 10 months having completed I-125 interstitial implant for Gleason 7 (3+4) adenocarcinoma the prostate. He is seen today in routine follow-up and is doing well. His PSA back in September was 2.5 most recently this month it was 1.7. He specifically denies any increased lower urinary tract symptoms diarrhea or fatigue..  COMPLICATIONS OF TREATMENT: none  FOLLOW UP COMPLIANCE: keeps appointments   PHYSICAL EXAM:  BP 132/78   Pulse 82   Temp (!) 97.5 F (36.4 C)   Resp 20   Wt 202 lb 6.1 oz (91.8 kg)   BMI 29.04 kg/m  Well-developed well-nourished patient in NAD. HEENT reveals PERLA, EOMI, discs not visualized.  Oral cavity is clear. No oral mucosal lesions are identified. Neck is clear without evidence of cervical or supraclavicular adenopathy. Lungs are clear to A&P. Cardiac examination is essentially unremarkable with regular rate and rhythm without murmur rub or thrill. Abdomen is benign with no organomegaly or masses noted. Motor sensory and DTR levels are equal and symmetric in the upper and lower extremities. Cranial nerves II through XII are grossly intact. Proprioception is intact. No peripheral adenopathy or edema is identified. No motor or sensory levels are noted. Crude visual fields are within normal range.  RADIOLOGY RESULTS: no current films for review  PLAN: present time patient is doing well under excellent biochemical control of his prostate cancer. I'm please was overall progress. I've asked to see him back in 6 months for follow-up. Patient is to call sooner with any concerns.  I would like to take this opportunity to  thank you for allowing me to participate in the care of your patient.Noreene Filbert, MD

## 2018-09-10 ENCOUNTER — Other Ambulatory Visit: Payer: Self-pay

## 2018-09-10 ENCOUNTER — Encounter: Payer: Self-pay | Admitting: Emergency Medicine

## 2018-09-10 ENCOUNTER — Emergency Department: Payer: Medicare HMO

## 2018-09-10 ENCOUNTER — Observation Stay
Admission: EM | Admit: 2018-09-10 | Discharge: 2018-09-11 | Disposition: A | Discharge status: Home / Self Care | Payer: Medicare HMO | Attending: Internal Medicine | Admitting: Internal Medicine

## 2018-09-10 DIAGNOSIS — I350 Nonrheumatic aortic (valve) stenosis: Secondary | ICD-10-CM | POA: Diagnosis not present

## 2018-09-10 DIAGNOSIS — R61 Generalized hyperhidrosis: Secondary | ICD-10-CM

## 2018-09-10 DIAGNOSIS — R7989 Other specified abnormal findings of blood chemistry: Principal | ICD-10-CM

## 2018-09-10 DIAGNOSIS — Z87891 Personal history of nicotine dependence: Secondary | ICD-10-CM | POA: Insufficient documentation

## 2018-09-10 DIAGNOSIS — R0602 Shortness of breath: Secondary | ICD-10-CM | POA: Insufficient documentation

## 2018-09-10 DIAGNOSIS — M1990 Unspecified osteoarthritis, unspecified site: Secondary | ICD-10-CM | POA: Diagnosis not present

## 2018-09-10 DIAGNOSIS — I2510 Atherosclerotic heart disease of native coronary artery without angina pectoris: Secondary | ICD-10-CM | POA: Insufficient documentation

## 2018-09-10 DIAGNOSIS — Z1159 Encounter for screening for other viral diseases: Secondary | ICD-10-CM | POA: Insufficient documentation

## 2018-09-10 DIAGNOSIS — Z7982 Long term (current) use of aspirin: Secondary | ICD-10-CM | POA: Insufficient documentation

## 2018-09-10 DIAGNOSIS — G629 Polyneuropathy, unspecified: Secondary | ICD-10-CM | POA: Diagnosis not present

## 2018-09-10 DIAGNOSIS — I491 Atrial premature depolarization: Secondary | ICD-10-CM | POA: Diagnosis not present

## 2018-09-10 DIAGNOSIS — N183 Chronic kidney disease, stage 3 (moderate): Secondary | ICD-10-CM | POA: Insufficient documentation

## 2018-09-10 DIAGNOSIS — R079 Chest pain, unspecified: Secondary | ICD-10-CM | POA: Diagnosis present

## 2018-09-10 DIAGNOSIS — R778 Other specified abnormalities of plasma proteins: Secondary | ICD-10-CM

## 2018-09-10 DIAGNOSIS — K219 Gastro-esophageal reflux disease without esophagitis: Secondary | ICD-10-CM | POA: Diagnosis not present

## 2018-09-10 DIAGNOSIS — Z79899 Other long term (current) drug therapy: Secondary | ICD-10-CM | POA: Insufficient documentation

## 2018-09-10 DIAGNOSIS — R030 Elevated blood-pressure reading, without diagnosis of hypertension: Secondary | ICD-10-CM | POA: Diagnosis not present

## 2018-09-10 LAB — BASIC METABOLIC PANEL
Anion gap: 12 (ref 5–15)
BUN: 13 mg/dL (ref 8–23)
CO2: 22 mmol/L (ref 22–32)
Calcium: 8.7 mg/dL — ABNORMAL LOW (ref 8.9–10.3)
Chloride: 103 mmol/L (ref 98–111)
Creatinine, Ser: 1.25 mg/dL — ABNORMAL HIGH (ref 0.61–1.24)
GFR calc Af Amer: >60 mL/min (ref >60)
GFR calc non Af Amer: 56 mL/min — ABNORMAL LOW (ref >60)
Glucose, Bld: 117 mg/dL — ABNORMAL HIGH (ref 70–99)
Potassium: 3.6 mmol/L (ref 3.5–5.1)
Sodium: 137 mmol/L (ref 135–145)

## 2018-09-10 LAB — CBC
HCT: 40.2 % (ref 39.0–52.0)
Hemoglobin: 13.8 g/dL (ref 13.0–17.0)
MCH: 32.4 pg (ref 26.0–34.0)
MCHC: 34.3 g/dL (ref 30.0–36.0)
MCV: 94.4 fL (ref 80.0–100.0)
Platelets: 169 10*3/uL (ref 150–400)
RBC: 4.26 MIL/uL (ref 4.22–5.81)
RDW: 12.3 % (ref 11.5–15.5)
WBC: 4.9 10*3/uL (ref 4.0–10.5)
nRBC: 0 % (ref 0.0–0.2)

## 2018-09-10 LAB — TROPONIN I
Troponin I: 0.05 ng/mL (ref <0.03)
Troponin I: 0.06 ng/mL (ref <0.03)
Troponin I: 0.06 ng/mL (ref <0.03)

## 2018-09-10 LAB — SARS CORONAVIRUS 2 BY RT PCR (HOSPITAL ORDER, PERFORMED IN ~~LOC~~ HOSPITAL LAB): SARS Coronavirus 2: NEGATIVE

## 2018-09-10 MED ORDER — VITAMIN B-12 1000 MCG PO TABS
1000.0000 ug | ORAL_TABLET | Freq: Every day | ORAL | Status: DC
  Administered 2018-09-11: 1000 ug via ORAL
  Filled 2018-09-10: qty 1

## 2018-09-10 MED ORDER — POLYVINYL ALCOHOL 1.4 % OP SOLN
Freq: Every day | OPHTHALMIC | Status: DC | PRN
  Filled 2018-09-10: qty 15

## 2018-09-10 MED ORDER — ADULT MULTIVITAMIN W/MINERALS CH
1.0000 | ORAL_TABLET | Freq: Every day | ORAL | Status: DC
  Administered 2018-09-11: 1 via ORAL
  Filled 2018-09-10: qty 1

## 2018-09-10 MED ORDER — ONDANSETRON HCL 4 MG PO TABS: 4.0000 mg | ORAL_TABLET | Freq: Four times a day (QID) | ORAL | Status: DC | PRN

## 2018-09-10 MED ORDER — OMEGA-3-ACID ETHYL ESTERS 1 G PO CAPS
1.0000 g | ORAL_CAPSULE | Freq: Every day | ORAL | Status: DC
  Administered 2018-09-11: 1 g via ORAL
  Filled 2018-09-10: qty 1

## 2018-09-10 MED ORDER — ASPIRIN 81 MG PO CHEW
324.0000 mg | CHEWABLE_TABLET | Freq: Once | ORAL | Status: AC
  Administered 2018-09-10: 17:00:00 324 mg via ORAL
  Filled 2018-09-10: qty 4

## 2018-09-10 MED ORDER — ACETAMINOPHEN 325 MG PO TABS: 650.0000 mg | ORAL_TABLET | Freq: Four times a day (QID) | ORAL | Status: DC | PRN

## 2018-09-10 MED ORDER — ACETAMINOPHEN 650 MG RE SUPP: 650.0000 mg | Freq: Four times a day (QID) | RECTAL | Status: DC | PRN

## 2018-09-10 MED ORDER — ASPIRIN EC 81 MG PO TBEC
81.0000 mg | DELAYED_RELEASE_TABLET | Freq: Every day | ORAL | Status: DC
  Administered 2018-09-10 – 2018-09-11 (×2): 81 mg via ORAL
  Filled 2018-09-10 (×2): qty 1

## 2018-09-10 MED ORDER — DOCUSATE SODIUM 100 MG PO CAPS: 100.0000 mg | ORAL_CAPSULE | Freq: Two times a day (BID) | ORAL | Status: DC | PRN

## 2018-09-10 MED ORDER — SODIUM CHLORIDE 0.9 % IV SOLN
INTRAVENOUS | Status: DC
  Administered 2018-09-10: 22:00:00 via INTRAVENOUS

## 2018-09-10 MED ORDER — ENOXAPARIN SODIUM 40 MG/0.4ML ~~LOC~~ SOLN
40.0000 mg | SUBCUTANEOUS | Status: DC
  Administered 2018-09-10: 40 mg via SUBCUTANEOUS
  Filled 2018-09-10: qty 0.4

## 2018-09-10 MED ORDER — ONDANSETRON HCL 4 MG/2ML IJ SOLN: 4.0000 mg | Freq: Four times a day (QID) | INTRAMUSCULAR | Status: DC | PRN

## 2018-09-10 NOTE — ED Provider Notes (Signed)
[facility] Emergency Department Provider Note   ____________________________________________   First MD Initiated Contact with Patient 09/10/18 [phone]     (approximate)  I have reviewed the triage vital signs and the nursing notes.   HISTORY  Chief Complaint Shortness of Breath    HPI Gregory Black is a 73 y.o. male just a little bit before he came to the emergency room was sitting at home when he started to feel little abnormal.  He felt little lightheaded, then started feeling extremely sweaty.   And felt slightly short of breath.  He did not have any pain.  However his symptoms are better now.  He reports he came because of the amount of sweat he reports he had drenching sweat for no reason, even sat in front of a fan for 15 minutes that did not help at all.  He is much better now.  He reports he does have a heart history and sees Dr. Ubaldo Glassing.  No nausea or vomiting.  No ongoing symptoms  He is planning to go to [place] for a hunting tournament soon, needs to make sure that he is okay for that.  Also, he reports that he does work goes to [employer], has been out in public but has noticed a fever or productive cough or upper respiratory symptom except he had a slight nosebleed of his right nare earlier today.  He also was at [place] [employer] in a large gathering a few weeks ago though he does not know of any obvious exposure to anyone with coronavirus  Past Medical History:  Diagnosis Date  . Aortic stenosis 07/27/14   Echo   . Arthritis    hands, knees  . Bulging lumbar disc   . Cancer [facility]) 2019   prostate  . Cardiac murmur   . Complication of anesthesia    during start of colonoscopy after medication given heart rate dropped so procedure was stopped  . History of concussion   . History of hiatal hernia   . Neuropathy   . Reflux    in past  . Weakness of both legs    pt says from Vit b12 deficiency  . Wears dentures    full upper and lower     Patient Active Problem List   Diagnosis Date Noted  . Compression fracture of L3 lumbar vertebra 04/20/2015  . Bilateral renal cysts 04/20/2015  . Cardiomegaly 04/20/2015  . Hepatic steatosis 04/20/2015  . L4-L5 disc bulge 04/20/2015  . Coronary artery disease 04/20/2015  . Ectatic abdominal aorta ([place]) 04/20/2015  . Preventative health care 04/09/2015  . Screening for AAA (abdominal aortic aneurysm) 04/08/2015  . Elevated PSA 04/06/2015  . Elevated LDL cholesterol level 04/06/2015  . Colon cancer screening 04/03/2015  . Neuropathy   . Aortic stenosis   . Cardiac murmur     Past Surgical History:  Procedure Laterality Date  . CATARACT EXTRACTION Left 2015  . COLONOSCOPY    . COLONOSCOPY WITH PROPOFOL N/A 05/17/2015   Procedure: COLONOSCOPY WITH PROPOFOL;  Surgeon: Lucilla Lame, MD;  Location: [place];  Service: Endoscopy;  Laterality: N/A;  . EYE SURGERY    . KNEE ARTHROSCOPY W/ MENISCAL REPAIR Right   . RADIOACTIVE SEED IMPLANT N/A 07/26/2017   Procedure: RADIOACTIVE SEED IMPLANT/BRACHYTHERAPY IMPLANT;  Surgeon: Hollice Espy, MD;  Location: [facility];  Service: Urology;  Laterality: N/A;  . TONSILLECTOMY    . TONSILLECTOMY AND ADENOIDECTOMY      Prior  to Admission medications   Medication Sig Start Date End Date Taking? Authorizing Provider  aspirin 81 MG tablet Take 81 mg by mouth once a week.     [provider]  docusate sodium (COLACE) 100 MG capsule Take 1 capsule (100 mg total) by mouth 2 (two) times daily as needed (take to keep stool soft.). 07/26/17   Hollice Espy, MD  HYDROcodone-acetaminophen (NORCO/VICODIN) 5-325 MG tablet Take 1-2 tablets by mouth every 6 (six) hours as needed for moderate pain. Patient not taking: Reported on 12/01/2017 07/26/17   Hollice Espy, MD  ibuprofen (ADVIL,MOTRIN) 200 MG tablet Take 200 mg by mouth 2 (two) times daily as needed for headache or moderate pain.    [provider]  Menthol-Methyl Salicylate  (MUSCLE RUB EX) Apply 1 application topically daily as needed (muscle pain).    [provider]  Multiple Vitamin (MULTIVITAMIN) tablet Take 1 tablet by mouth daily.    [provider]  Omega-3 Fatty Acids (FISH OIL) 1000 MG CAPS Take 1,000 mg by mouth daily.    [provider]  Polyethyl Glycol-Propyl Glycol (SYSTANE OP) Place 1 drop into both eyes daily as needed (for dry/red eyes).    [provider]  tamsulosin (FLOMAX) 0.4 MG CAPS capsule Take 1 capsule (0.4 mg total) by mouth daily. Patient not taking: Reported on 12/01/2017 07/26/17   Hollice Espy, MD  vitamin B-12 (CYANOCOBALAMIN) 1000 MCG tablet Take 1,000 mcg by mouth daily.    [provider]    Allergies Pollen extract and Vicodin [hydrocodone-acetaminophen]  Family History  Problem Relation Age of Onset  . Heart disease Mother   . Stroke Mother   . Diabetes Mother        high blood sugar  . Hypertension Mother   . Cancer Father   . Arthritis Father   . Cancer Brother        throat  . Diabetes Brother   . Lung disease Brother        asbestos    Social History Social History   Tobacco Use  . Smoking status: Former Research scientist (life sciences)  . Smokeless tobacco: Never Used  . Tobacco comment: quit approx 2010  Substance Use Topics  . Alcohol use: Yes    Alcohol/week: 1.0 standard drinks    Types: 1 Cans of beer per week    Comment: 1 beer a week  . Drug use: No    Review of Systems Constitutional: No fever/chills but had a drenching sweat Eyes: No visual changes. ENT: No sore throat. Cardiovascular: Denies chest pain. Respiratory: Felt a little short of breath during the episode of severe sweating Gastrointestinal: No abdominal pain.   Genitourinary: Negative for dysuria. Musculoskeletal: Negative for back pain. Skin: Negative for rash. Neurological: Negative for headaches, areas of focal weakness or numbness.    ____________________________________________   PHYSICAL  EXAM:  VITAL SIGNS: ED Triage Vitals  Enc Vitals Group     BP 09/10/18 1311 (!) 173/82     Pulse Rate 09/10/18 1311 (!) 59     Resp 09/10/18 1311 16     Temp 09/10/18 1311 99.1 F (37.3 C)     Temp Source 09/10/18 1311 Oral     SpO2 09/10/18 1311 99 %     Weight 09/10/18 1328 210 lb (95.3 kg)     Height 09/10/18 1328 5\' 10"  (1.778 m)     Head Circumference --      Peak Flow --      Pain  Score 09/10/18 1325 0     Pain Loc --      Pain Edu? --      Excl. in [place]? --     Constitutional: Alert and oriented. Well appearing and in no acute distress.  He is very pleasant.  Enjoys discussing hunting. Eyes: Conjunctivae are normal. Head: Atraumatic. Nose: No congestion/rhinnorhea. Mouth/Throat: Mucous membranes are moist. Neck: No stridor.  Cardiovascular: Normal rate, regular rhythm.  He is a moderate systolic murmur. good peripheral circulation. Respiratory: Normal respiratory effort.  No retractions. Lungs CTAB. Gastrointestinal: Soft and nontender. No distention. Musculoskeletal: No lower extremity tenderness nor edema. Neurologic:  Normal speech and language. No gross focal neurologic deficits are appreciated.  Skin:  Skin is warm, dry and intact. No rash noted. Psychiatric: Mood and affect are normal. Speech and behavior are normal.  ____________________________________________   LABS (all labs ordered are listed, but only abnormal results are displayed)  Labs Reviewed  BASIC METABOLIC PANEL - Abnormal; Notable for the following components:      Result Value   Glucose, Bld 117 (*)    Creatinine, Ser 1.25 (*)    Calcium 8.7 (*)    GFR calc non Af Amer 56 (*)    All other components within normal limits  TROPONIN I - Abnormal; Notable for the following components:   Troponin I 0.05 (*)    All other components within normal limits  SARS CORONAVIRUS 2 (HOSPITAL ORDER, [place] [facility])  CBC   ____________________________________________  EKG   Reviewed enterotomy at 1320 Heart rate 55 QRS 100 QTc 420 Slight sinus bradycardia, occasional P AC.  And a slightly present T wave abnormality including slight depression in V4 and a slightly depressed appearance and V6.  Very minimal T wave abnormality, but I do think it is present ____________________________________________  RADIOLOGY  Dg Chest 2 View  Result Date: 09/10/2018 CLINICAL DATA:  Shortness of breath EXAM: CHEST - 2 VIEW COMPARISON:  None. FINDINGS: No focal airspace disease or effusion. Borderline heart size. No pneumothorax. IMPRESSION: No active cardiopulmonary disease. Electronically Signed   By: Donavan Foil M.D.   On: 09/10/2018 15:05    Chest x-ray negative for acute ____________________________________________   PROCEDURES  Procedure(s) performed: None  Procedures  Critical Care performed: No  ____________________________________________   INITIAL IMPRESSION / ASSESSMENT AND PLAN / ED COURSE  Pertinent labs & imaging results that were available during my care of the patient were reviewed by me and considered in my medical decision making (see chart for details).   Episode of slight shortness of breath with heavy sweating that lasted for about 15 minutes.  Some potential risk factors for COVID exposure, but no obvious symptoms to suggest COVID disease.  I will check a COVID rapid, we will also give aspirin.  He is asymptomatic now but his T waves look slightly abnormal on EKG and his first troponin 0 0.05 and he has no baseline to make comparison to, but clearly this is abnormal and in the setting of his symptoms occurring not long ago this could represent demand ischemia, ACS, or other etiology.  No STEMI and his pain and symptom-free at the moment which is reassuring.  Discussed case and care with cardiology Dr. Saralyn Pilar patient will be admitted for cardiac observation  Reviewed and discussed the plan with the patient, he is understanding of the need  for admission.  He reports that this symptom of sweating and very felt earlier was very abnormal for  him and I was concerned this could be indicative of cardiac or other concerning etiology.  ----------------------------------------- 6:25 PM on 09/10/2018 -----------------------------------------  COVID negative, discussed with Dr. Bobbye Charleston.  Patient will be admitted for further work-up and monitoring.  Patient in agreement      ____________________________________________   FINAL CLINICAL IMPRESSION(S) / ED DIAGNOSES  Final diagnoses:  Elevated troponin  Diaphoresis  Moderate risk CAD      Note:  This document was prepared using Dragon voice recognition software and may include unintentional dictation errors       Delman Kitten, MD 09/10/18 1826

## 2018-09-10 NOTE — H&P (Signed)
[place] [facility] [place] NAME: Gregory Black    MR#:  [id number]  DATE OF BIRTH:  27-May-1945  DATE OF ADMISSION:  09/10/2018  PRIMARY CARE PHYSICIAN: Kirk Ruths, MD   REQUESTING/REFERRING PHYSICIAN: Dr Delman Kitten  CHIEF COMPLAINT:   Chief Complaint  Patient presents with  . Shortness of Breath    HISTORY OF PRESENT ILLNESS:  Gregory Black  is a 73 y.o. male presents after an episode today where he was going to his dentist to do some paperwork he had a cough and sneeze and then blood came out of his right nostril.  When he had a sweating episode felt a little bit dizzy.  He states he occasionally gets a little chest pain.  He states the pain was not bad and went away very quickly.  He is feeling fine right now.  In the ER he had a borderline elevated troponin and the ER physician felt he needed to be watched overnight and have serial troponins.  Unfortunately since today is Saturday there is no cardiac testing tomorrow on Sunday.  PAST MEDICAL HISTORY:   Past Medical History:  Diagnosis Date  . Aortic stenosis 07/27/14   Echo   . Arthritis    hands, knees  . Bulging lumbar disc   . Cancer [facility]) 2019   prostate  . Cardiac murmur   . Complication of anesthesia    during start of colonoscopy after medication given heart rate dropped so procedure was stopped  . History of concussion   . History of hiatal hernia   . Neuropathy   . Reflux    in past  . Weakness of both legs    pt says from Vit b12 deficiency  . Wears dentures    full upper and lower    PAST SURGICAL HISTORY:   Past Surgical History:  Procedure Laterality Date  . CATARACT EXTRACTION Left 2015  . COLONOSCOPY    . COLONOSCOPY WITH PROPOFOL N/A 05/17/2015   Procedure: COLONOSCOPY WITH PROPOFOL;  Surgeon: Lucilla Lame, MD;  Location: [place];  Service: Endoscopy;  Laterality: N/A;  . EYE SURGERY    . KNEE ARTHROSCOPY W/ MENISCAL REPAIR  Right   . RADIOACTIVE SEED IMPLANT N/A 07/26/2017   Procedure: RADIOACTIVE SEED IMPLANT/BRACHYTHERAPY IMPLANT;  Surgeon: Hollice Espy, MD;  Location: [facility];  Service: Urology;  Laterality: N/A;  . TONSILLECTOMY    . TONSILLECTOMY AND ADENOIDECTOMY      SOCIAL HISTORY:   Social History   Tobacco Use  . Smoking status: Former Research scientist (life sciences)  . Smokeless tobacco: Never Used  . Tobacco comment: quit approx 2010  Substance Use Topics  . Alcohol use: Yes    Alcohol/week: 1.0 standard drinks    Types: 1 Cans of beer per week    Comment: 1 beer a week    FAMILY HISTORY:   Family History  Problem Relation Age of Onset  . Heart disease Mother   . Stroke Mother   . Diabetes Mother        high blood sugar  . Hypertension Mother   . Cancer Father   . Arthritis Father   . Cancer Brother        throat  . Diabetes Brother   . Lung disease Brother        asbestos    DRUG ALLERGIES:   Allergies  Allergen Reactions  . Pollen Extract Other (See Comments)    Sneezing, runny eyes  .  Vicodin [Hydrocodone-Acetaminophen] Other (See Comments)    Unable to void or empty bowels    REVIEW OF SYSTEMS:  CONSTITUTIONAL: No fever.  Positive for sweating episode.  No fatigue or weakness.  EYES: Sometimes the light bothers his eyes. EARS, NOSE, AND THROAT: No tinnitus or ear pain. No sore throat.  Had some bleeding from his right nostril today RESPIRATORY: No cough, shortness of breath, wheezing or hemoptysis.  CARDIOVASCULAR: Occasional chest pain, no orthopnea, edema.  GASTROINTESTINAL: No nausea, vomiting, diarrhea or abdominal pain. No blood in bowel movements GENITOURINARY: No dysuria, hematuria.  ENDOCRINE: No polyuria, nocturia,  HEMATOLOGY: No anemia, easy bruising or bleeding SKIN: No rash or lesion. MUSCULOSKELETAL: No joint pain or arthritis.   NEUROLOGIC: No tingling, numbness, weakness.  PSYCHIATRY: No anxiety or depression.   MEDICATIONS AT HOME:   Prior to Admission  medications   Medication Sig Start Date End Date Taking? Authorizing Provider  aspirin 81 MG tablet Take 81 mg by mouth once a week.     [provider]  docusate sodium (COLACE) 100 MG capsule Take 1 capsule (100 mg total) by mouth 2 (two) times daily as needed (take to keep stool soft.). 07/26/17   Hollice Espy, MD  ibuprofen (ADVIL,MOTRIN) 200 MG tablet Take 200 mg by mouth 2 (two) times daily as needed for headache or moderate pain.    [provider]  Menthol-Methyl Salicylate (MUSCLE RUB EX) Apply 1 application topically daily as needed (muscle pain).    [provider]  Multiple Vitamin (MULTIVITAMIN) tablet Take 1 tablet by mouth daily.    [provider]  Omega-3 Fatty Acids (FISH OIL) 1000 MG CAPS Take 1,000 mg by mouth daily.    [provider]  Polyethyl Glycol-Propyl Glycol (SYSTANE OP) Place 1 drop into both eyes daily as needed (for dry/red eyes).    [provider]  vitamin B-12 (CYANOCOBALAMIN) 1000 MCG tablet Take 1,000 mcg by mouth daily.    [provider]      VITAL SIGNS:  Blood pressure (!) 165/87, pulse (!) 43, temperature 99.1 F (37.3 C), temperature source Oral, resp. rate 15, height 5\' 10"  (1.778 m), weight 95.3 kg, SpO2 98 %.  PHYSICAL EXAMINATION:  GENERAL:  73 y.o.-year-old patient lying in the bed with no acute distress.  EYES: Pupils equal, round, reactive to light and accommodation. No scleral icterus. Extraocular muscles intact.  HEENT: Head atraumatic, normocephalic. Oropharynx and nasopharynx clear.  NECK:  Supple, no jugular venous distention. No thyroid enlargement, no tenderness.  LUNGS: Normal breath sounds bilaterally, no wheezing, rales,rhonchi or crepitation. No use of accessory muscles of respiration.  CARDIOVASCULAR: S1, S2 normal.  3-6 systolic murmur.  No rubs, or gallops.  ABDOMEN: Soft, nontender, nondistended. Bowel sounds present. No organomegaly or mass.  EXTREMITIES: No pedal  edema, cyanosis, or clubbing.  NEUROLOGIC: Cranial nerves II through XII are intact. Muscle strength 5/5 in all extremities. Sensation intact. Gait not checked.  PSYCHIATRIC: The patient is alert and oriented x 3.  SKIN: No rash, lesion, or ulcer.   LABORATORY PANEL:   CBC Recent Labs  Lab 09/10/18 1321  WBC 4.9  HGB 13.8  HCT 40.2  PLT 169   ------------------------------------------------------------------------------------------------------------------  Chemistries  Recent Labs  Lab 09/10/18 1321  NA 137  K 3.6  CL 103  CO2 22  GLUCOSE 117*  BUN 13  CREATININE 1.25*  CALCIUM 8.7*   ------------------------------------------------------------------------------------------------------------------  Cardiac Enzymes Recent Labs  Lab 09/10/18 1321  TROPONINI 0.05*   ------------------------------------------------------------------------------------------------------------------  RADIOLOGY:  Dg Chest 2 View  Result Date: 09/10/2018 CLINICAL DATA:  Shortness of breath EXAM: CHEST - 2 VIEW COMPARISON:  None. FINDINGS: No focal airspace disease or effusion. Borderline heart size. No pneumothorax. IMPRESSION: No active cardiopulmonary disease. Electronically Signed   By: Donavan Foil M.D.   On: 09/10/2018 15:05    EKG:   Sinus bradycardia 56 bpm  IMPRESSION AND PLAN:   1.  Chest pain with elevated troponin near syncope.  Give IV fluid hydration overnight.  Serial troponins.  Cardiology consultation for the a.m. with Dr. Lorinda Creed.  Normally sees Dr. Ubaldo Glassing as outpatient.  Unfortunately no testing on Sunday. 2.  Chronic kidney disease stage III.  Give gentle IV fluids overnight and see if creatinine improves. 3.  Aortic stenosis on previous echocardiogram.  Will check another echo. 4.  Elevated blood pressure without diagnosis of hypertension.  Continue to monitor few more blood pressure readings.   All the records are reviewed and case discussed with ED  provider. Management plans discussed with the patient, family and they are in agreement.  CODE STATUS: Full code  TOTAL TIME TAKING CARE OF THIS PATIENT: 50 minutes.    Loletha Grayer M.D on 09/10/2018 at 6:53 PM  Between 7am to 6pm - Pager - [phone]  After 6pm call admission pager [phone]  [facility]  [phone]  CC: Primary care physician; Kirk Ruths, MD

## 2018-09-10 NOTE — ED Notes (Signed)
Attempted to call report. RN to call this RN back once bed is assigned.

## 2018-09-10 NOTE — ED Notes (Signed)
ED TO INPATIENT HANDOFF REPORT  ED Nurse Name and Phone #:  [phone]  S Name/Age/Gender Gregory Black 73 y.o. male Room/Bed: ED09A/ED09A  Code Status   Code Status: Full Code  Home/SNF/Other Home Patient oriented to: self, place, time and situation Is this baseline? Yes   Triage Complete: Triage complete  Chief Complaint sweating,breathing difficulty  Triage Note Pt arrived via POV with reports of becoming short of breath and sweaty despite sitting in front of a fan.  Pt states he is a patient of Dr. Bethanne Ginger, states he was supposed to be seen in September, but they canceled his appointment and pt never called to reschedule.  Pt states he is supposed to go to [facility] July 1st.   Pt states he was at [place] a few weeks ago too.    Allergies Allergies  Allergen Reactions  . Pollen Extract Other (See Comments)    Sneezing, runny eyes  . Vicodin [Hydrocodone-Acetaminophen] Other (See Comments)    Unable to void or empty bowels    Level of Care/Admitting Diagnosis ED Disposition    ED Disposition Condition [place]: [place] [[id number]]  Level of Care: Telemetry [5]  Covid Evaluation: Person Under Investigation (PUI)  Isolation Risk Level: Low Risk/Droplet (Less than 4L [place] supplementation)  Diagnosis: Chest pain [536644]  Admitting Physician: Loletha Grayer [[id number]]  Attending Physician: Loletha Grayer [phone]  PT Class (Do Not Modify): Observation [104]  PT Acc Code (Do Not Modify): Observation [10022]       B Medical/Surgery History Past Medical History:  Diagnosis Date  . Aortic stenosis 07/27/14   Echo   . Arthritis    hands, knees  . Bulging lumbar disc   . Cancer [facility]) 2019   prostate  . Cardiac murmur   . Complication of anesthesia    during start of colonoscopy after medication given heart rate dropped so procedure was stopped  . History of concussion   . History of hiatal hernia   . Neuropathy    . Reflux    in past  . Weakness of both legs    pt says from Vit b12 deficiency  . Wears dentures    full upper and lower   Past Surgical History:  Procedure Laterality Date  . CATARACT EXTRACTION Left 2015  . COLONOSCOPY    . COLONOSCOPY WITH PROPOFOL N/A 05/17/2015   Procedure: COLONOSCOPY WITH PROPOFOL;  Surgeon: Lucilla Lame, MD;  Location: [place];  Service: Endoscopy;  Laterality: N/A;  . EYE SURGERY    . KNEE ARTHROSCOPY W/ MENISCAL REPAIR Right   . RADIOACTIVE SEED IMPLANT N/A 07/26/2017   Procedure: RADIOACTIVE SEED IMPLANT/BRACHYTHERAPY IMPLANT;  Surgeon: Hollice Espy, MD;  Location: [facility] ORS;  Service: Urology;  Laterality: N/A;  . TONSILLECTOMY    . TONSILLECTOMY AND ADENOIDECTOMY       A IV Location/Drains/Wounds Patient Lines/Drains/Airways Status   Active Line/Drains/Airways    Name:   Placement date:   Placement time:   Site:   Days:   Urethral Catheter dr Erlene Quan Latex;Double-lumen;Straight-tip 16 Fr.   07/26/17    7564    Latex;Double-lumen;Straight-tip   411   Incision (Closed) 07/26/17 Perineum Other (Comment)   07/26/17    0831     411          Intake/Output Last 24 hours No intake or output data in the 24 hours ending 09/10/18 1900  Labs/Imaging Results for orders placed or performed during  the hospital encounter of 09/10/18 (from the past 48 hour(s))  Basic metabolic panel     Status: Abnormal   Collection Time: 09/10/18  1:21 PM  Result Value Ref Range   Sodium 137 135 - 145 mmol/L   Potassium 3.6 3.5 - 5.1 mmol/L   Chloride 103 98 - 111 mmol/L   CO2 22 22 - 32 mmol/L   Glucose, Bld 117 (H) 70 - 99 mg/dL   BUN 13 8 - 23 mg/dL   Creatinine, Ser 1.25 (H) 0.61 - 1.24 mg/dL   Calcium 8.7 (L) 8.9 - 10.3 mg/dL   GFR calc non Af Amer 56 (L) >60 mL/min   GFR calc Af Amer >60 >60 mL/min   Anion gap 12 5 - 15    Comment: Performed at [facility], [address] [place], [place] [address]  CBC     Status: None   Collection  Time: 09/10/18  1:21 PM  Result Value Ref Range   WBC 4.9 4.0 - 10.5 K/uL   RBC 4.26 4.22 - 5.81 MIL/uL   Hemoglobin 13.8 13.0 - 17.0 g/dL   HCT 40.2 39.0 - 52.0 %   MCV 94.4 80.0 - 100.0 fL   MCH 32.4 26.0 - 34.0 pg   MCHC 34.3 30.0 - 36.0 g/dL   RDW 12.3 11.5 - 15.5 %   Platelets 169 150 - 400 K/uL   nRBC 0.0 0.0 - 0.2 %    Comment: Performed at [facility], [address] [place], [place] [address]  Troponin I - ONCE - STAT     Status: Abnormal   Collection Time: 09/10/18  1:21 PM  Result Value Ref Range   Troponin I 0.05 (HH) <0.03 ng/mL    Comment: CRITICAL RESULT CALLED TO, READ BACK BY AND VERIFIED WITH ASHLEY ORSUTO ON 09/10/2018 AT 1412 QSD Performed at [facility], [address] [place], [place] [address]   SARS Coronavirus 2 (CEPHEID- Performed in [place] hospital lab), Hosp Order     Status: None   Collection Time: 09/10/18  4:50 PM   Specimen: Nasopharyngeal Swab  Result Value Ref Range   SARS Coronavirus 2 NEGATIVE NEGATIVE    Comment: (NOTE) If result is NEGATIVE SARS-CoV-2 target nucleic acids are NOT DETECTED. The SARS-CoV-2 RNA is generally detectable in upper and lower  respiratory specimens during the acute phase of infection. The lowest  concentration of SARS-CoV-2 viral copies this assay can detect is 250  copies / mL. A negative result does not preclude SARS-CoV-2 infection  and should not be used as the sole basis for treatment or other  patient management decisions.  A negative result may occur with  improper specimen collection / handling, submission of specimen other  than nasopharyngeal swab, presence of viral mutation(s) within the  areas targeted by this assay, and inadequate number of viral copies  (<250 copies / mL). A negative result must be combined with clinical  observations, patient history, and epidemiological information. If result is POSITIVE SARS-CoV-2 target nucleic acids are DETECTED. The SARS-CoV-2 RNA  is generally detectable in upper and lower  respiratory specimens dur ing the acute phase of infection.  Positive  results are indicative of active infection with SARS-CoV-2.  Clinical  correlation with patient history and other diagnostic information is  necessary to determine patient infection status.  Positive results do  not rule out bacterial infection or co-infection with other viruses. If result is PRESUMPTIVE POSTIVE SARS-CoV-2 nucleic acids MAY BE  PRESENT.   A presumptive positive result was obtained on the submitted specimen  and confirmed on repeat testing.  While 2019 novel coronavirus  (SARS-CoV-2) nucleic acids may be present in the submitted sample  additional confirmatory testing may be necessary for epidemiological  and / or clinical management purposes  to differentiate between  SARS-CoV-2 and other Sarbecovirus currently known to infect humans.  If clinically indicated additional testing with an alternate test  methodology [phone]) is advised. The SARS-CoV-2 RNA is generally  detectable in upper and lower respiratory sp ecimens during the acute  phase of infection. The expected result is Negative. Fact Sheet for Patients:  StrictlyIdeas.no Fact Sheet for Healthcare Providers: BankingDealers.co.za This test is not yet approved or cleared by the [place] FDA and has been authorized for detection and/or diagnosis of SARS-CoV-2 by FDA under an Emergency Use Authorization (EUA).  This EUA will remain in effect (meaning this test can be used) for the duration of the COVID-19 declaration under Section 564(b)(1) of the Act, 21 U.S.C. section 360bbb-3(b)(1), unless the authorization is terminated or revoked sooner. Performed at [facility], [address] [place], [place] [address]    Dg Chest 2 View  Result Date: 09/10/2018 CLINICAL DATA:  Shortness of breath EXAM: CHEST - 2 VIEW COMPARISON:  None.  FINDINGS: No focal airspace disease or effusion. Borderline heart size. No pneumothorax. IMPRESSION: No active cardiopulmonary disease. Electronically Signed   By: Donavan Foil M.D.   On: 09/10/2018 15:05    Pending Labs Unresulted Labs (From admission, onward)    Start     Ordered   09/17/18 0500  Creatinine, serum  (enoxaparin (LOVENOX)    CrCl >/= 30 ml/min)  Weekly,   STAT    Comments: while on enoxaparin therapy    09/10/18 1858   09/11/18 0500  Lipid panel  Tomorrow morning,   STAT     09/10/18 1833   09/11/18 2876  Basic metabolic panel  Tomorrow morning,   STAT     09/10/18 1858   09/11/18 0500  CBC  Tomorrow morning,   STAT     09/10/18 1858   09/10/18 2233  Troponin I - Once  Once,   STAT     09/10/18 1833   09/10/18 1833  Troponin I - Once  Once,   STAT     09/10/18 1833          Vitals/Pain Today's Vitals   09/10/18 1656 09/10/18 1700 09/10/18 1701 09/10/18 1730  BP: (!) 157/103 (!) 176/95  (!) 165/87  Pulse: (!) 55 (!) 50  (!) 43  Resp: 14  18 15   Temp:      TempSrc:      SpO2: 97% 99%  98%  Weight:      Height:      PainSc:        Isolation Precautions Droplet and Contact precautions  Medications Medications  aspirin EC tablet 81 mg (has no administration in time range)  docusate sodium (COLACE) capsule 100 mg (has no administration in time range)  vitamin B-12 (CYANOCOBALAMIN) tablet 1,000 mcg (has no administration in time range)  multivitamin with minerals tablet 1 tablet (has no administration in time range)  omega-3 acid ethyl esters (LOVAZA) capsule 1 g (has no administration in time range)  polyethylene glycol 0.4% and propylene glycol 0.3% (SYSTANE) ophthalmic gel (has no administration in time range)  0.9 %  sodium chloride infusion (has no administration in time range)  enoxaparin (LOVENOX)  injection 40 mg (has no administration in time range)  acetaminophen (TYLENOL) tablet 650 mg (has no administration in time range)    Or   acetaminophen (TYLENOL) suppository 650 mg (has no administration in time range)  ondansetron (ZOFRAN) tablet 4 mg (has no administration in time range)    Or  ondansetron (ZOFRAN) injection 4 mg (has no administration in time range)  aspirin chewable tablet 324 mg (324 mg Oral Given 09/10/18 1651)    Mobility walks Low fall risk   Focused Assessments    R Recommendations: See Admitting Provider Note  Report given to:   Additional Notes:

## 2018-09-10 NOTE — ED Triage Notes (Addendum)
Pt arrived via POV with reports of becoming short of breath and sweaty despite sitting in front of a fan.  Pt states he is a patient of Dr. Bethanne Ginger, states he was supposed to be seen in September, but they canceled his appointment and pt never called to reschedule.  Pt states he is supposed to go to [facility] July 1st.   Pt states he was at [place] a few weeks ago too.

## 2018-09-10 NOTE — ED Notes (Signed)
Pt states earlier today he was sitting at a desk and became hot and diaphoretic. Then his nose bled and he felt dizzy. States he was coughing and sneezing right before nose bleed. Pt denies fever or SOB. States "I take a 81mg  asa once a week." states his MD told him only to take it once a week. Talking in complete sentences. A&O, ambulatory. No distress noted.

## 2018-09-11 ENCOUNTER — Observation Stay
Admit: 2018-09-11 | Discharge: 2018-09-11 | Disposition: A | Discharge status: Home / Self Care | Payer: Medicare HMO | Attending: Internal Medicine | Admitting: Internal Medicine

## 2018-09-11 ENCOUNTER — Other Ambulatory Visit: Payer: Self-pay

## 2018-09-11 LAB — CBC
HCT: 39.4 % (ref 39.0–52.0)
Hemoglobin: 13.6 g/dL (ref 13.0–17.0)
MCH: 32.6 pg (ref 26.0–34.0)
MCHC: 34.5 g/dL (ref 30.0–36.0)
MCV: 94.5 fL (ref 80.0–100.0)
Platelets: 149 10*3/uL — ABNORMAL LOW (ref 150–400)
RBC: 4.17 MIL/uL — ABNORMAL LOW (ref 4.22–5.81)
RDW: 12.2 % (ref 11.5–15.5)
WBC: 4.4 10*3/uL (ref 4.0–10.5)
nRBC: 0 % (ref 0.0–0.2)

## 2018-09-11 LAB — BASIC METABOLIC PANEL
Anion gap: 6 (ref 5–15)
BUN: 11 mg/dL (ref 8–23)
CO2: 28 mmol/L (ref 22–32)
Calcium: 8.6 mg/dL — ABNORMAL LOW (ref 8.9–10.3)
Chloride: 106 mmol/L (ref 98–111)
Creatinine, Ser: 1.02 mg/dL (ref 0.61–1.24)
GFR calc Af Amer: >60 mL/min (ref >60)
GFR calc non Af Amer: >60 mL/min (ref >60)
Glucose, Bld: 96 mg/dL (ref 70–99)
Potassium: 3.8 mmol/L (ref 3.5–5.1)
Sodium: 140 mmol/L (ref 135–145)

## 2018-09-11 LAB — LIPID PANEL
Cholesterol: 179 mg/dL (ref 0–200)
HDL: 38 mg/dL — ABNORMAL LOW (ref >40)
LDL Cholesterol: 116 mg/dL — ABNORMAL HIGH (ref 0–99)
Total CHOL/HDL Ratio: 4.7 RATIO
Triglycerides: 124 mg/dL (ref <150)
VLDL: 25 mg/dL (ref 0–40)

## 2018-09-11 LAB — ECHOCARDIOGRAM COMPLETE
Height: 70 in
Weight: 3360 oz

## 2018-09-11 NOTE — Consult Note (Signed)
[facility] Cardiology  CARDIOLOGY CONSULT NOTE  Patient ID: Gregory Black MRN: [id number] DOB/AGE: 11/10/1945 73 y.o.  Admit date: 09/10/2018 Referring Physician Tressia Miners Primary Physician [facility] Primary Cardiologist Fath Reason for Consultation pre-syncope  HPI: 73 year old gentleman referred for evaluation of presyncope.  He has known history of moderate to severe aortic stenosis, and bradycardia.  He was in his usual state of health until yesterday when he sneezed, did blood from his right nostril, became diaphoretic, and experienced presyncope when he stood up, without loss of consciousness.  The patient presented to [facility] emergency room where ECG revealed sinus bradycardia rate of 56 bpm.  Patient was admitted to telemetry for overnight observation.  Bone was mildly elevated 0.06, 0.06, 0.05, in the absence of chest pain or ECG changes.  Patient currently denies chest pain, shortness of breath, palpitations, presyncope or syncope.  Review of systems complete and found to be negative unless listed above     Past Medical History:  Diagnosis Date  . Aortic stenosis 07/27/14   Echo   . Arthritis    hands, knees  . Bulging lumbar disc   . Cancer [facility]) 2019   prostate  . Cardiac murmur   . Complication of anesthesia    during start of colonoscopy after medication given heart rate dropped so procedure was stopped  . History of concussion   . History of hiatal hernia   . Neuropathy   . Reflux    in past  . Weakness of both legs    pt says from Vit b12 deficiency  . Wears dentures    full upper and lower    Past Surgical History:  Procedure Laterality Date  . CATARACT EXTRACTION Left 2015  . COLONOSCOPY    . COLONOSCOPY WITH PROPOFOL N/A 05/17/2015   Procedure: COLONOSCOPY WITH PROPOFOL;  Surgeon: Lucilla Lame, MD;  Location: [place];  Service: Endoscopy;  Laterality: N/A;  . EYE SURGERY    . KNEE ARTHROSCOPY W/ MENISCAL REPAIR Right   . RADIOACTIVE SEED IMPLANT N/A  07/26/2017   Procedure: RADIOACTIVE SEED IMPLANT/BRACHYTHERAPY IMPLANT;  Surgeon: Hollice Espy, MD;  Location: [facility] ORS;  Service: Urology;  Laterality: N/A;  . TONSILLECTOMY    . TONSILLECTOMY AND ADENOIDECTOMY      Medications Prior to Admission  Medication Sig Dispense Refill Last Dose  . Multiple Vitamin (MULTIVITAMIN) tablet Take 1 tablet by mouth daily.   09/09/2018 at Unknown time  . Omega-3 Fatty Acids (FISH OIL) 1000 MG CAPS Take 1,000 mg by mouth daily.   09/09/2018 at Unknown time  . vitamin B-12 (CYANOCOBALAMIN) 1000 MCG tablet Take 1,000 mcg by mouth daily.   09/09/2018 at Unknown time  . aspirin 81 MG tablet Take 81 mg by mouth once a week.    prn at prn  . docusate sodium (COLACE) 100 MG capsule Take 1 capsule (100 mg total) by mouth 2 (two) times daily as needed (take to keep stool soft.). 60 capsule 0 prn at prn  . ibuprofen (ADVIL,MOTRIN) 200 MG tablet Take 200 mg by mouth 2 (two) times daily as needed for headache or moderate pain.   prn at prn  . Menthol-Methyl Salicylate (MUSCLE RUB EX) Apply 1 application topically daily as needed (muscle pain).   prn at prn  . Polyethyl Glycol-Propyl Glycol (SYSTANE OP) Place 1 drop into both eyes daily as needed (for dry/red eyes).   prn at prn   Social History   Socioeconomic History  . Marital status: Married  Spouse name: Not on file  . Number of children: Not on file  . Years of education: Not on file  . Highest education level: Not on file  Occupational History  . Not on file  Social Needs  . Financial resource strain: Not on file  . Food insecurity    Worry: Not on file    Inability: Not on file  . Transportation needs    Medical: Not on file    Non-medical: Not on file  Tobacco Use  . Smoking status: Former Research scientist (life sciences)  . Smokeless tobacco: Never Used  . Tobacco comment: quit approx 2010  Substance and Sexual Activity  . Alcohol use: Yes    Alcohol/week: 1.0 standard drinks    Types: 1 Cans of beer per week     Comment: 1 beer a week  . Drug use: No  . Sexual activity: Not on file  Lifestyle  . Physical activity    Days per week: Not on file    Minutes per session: Not on file  . Stress: Not on file  Relationships  . Social Herbalist on phone: Not on file    Gets together: Not on file    Attends religious service: Not on file    Active member of club or organization: Not on file    Attends meetings of clubs or organizations: Not on file    Relationship status: Not on file  . Intimate partner violence    Fear of current or ex partner: Not on file    Emotionally abused: Not on file    Physically abused: Not on file    Forced sexual activity: Not on file  Other Topics Concern  . Not on file  Social History Narrative  . Not on file    Family History  Problem Relation Age of Onset  . Heart disease Mother   . Stroke Mother   . Diabetes Mother        high blood sugar  . Hypertension Mother   . Cancer Father   . Arthritis Father   . Cancer Brother        throat  . Diabetes Brother   . Lung disease Brother        asbestos      Review of systems complete and found to be negative unless listed above      PHYSICAL EXAM  General: Well developed, well nourished, in no acute distress HEENT:  Normocephalic and atramatic Neck:  No JVD.  Lungs: Clear bilaterally to auscultation and percussion. Heart: HRRR . Normal S1 and S2 without gallops or murmurs.  Abdomen: Bowel sounds are positive, abdomen soft and non-tender  Msk:  Back normal, normal gait. Normal strength and tone for age. Extremities: No clubbing, cyanosis or edema.   Neuro: Alert and oriented X 3. Psych:  Good affect, responds appropriately  Labs:   Lab Results  Component Value Date   WBC 4.4 09/11/2018   HGB 13.6 09/11/2018   HCT 39.4 09/11/2018   MCV 94.5 09/11/2018   PLT 149 (L) 09/11/2018    Recent Labs  Lab 09/11/18 0622  NA 140  K 3.8  CL 106  CO2 28  BUN 11  CREATININE 1.02  CALCIUM  8.6*  GLUCOSE 96   Lab Results  Component Value Date   TROPONINI 0.06 (HH) 09/10/2018    Lab Results  Component Value Date   CHOL 179 09/11/2018   CHOL 185 04/03/2015   Lab  Results  Component Value Date   HDL 38 (L) 09/11/2018   HDL 52 04/03/2015   Lab Results  Component Value Date   LDLCALC 116 (H) 09/11/2018   LDLCALC 120 (H) 04/03/2015   Lab Results  Component Value Date   TRIG 124 09/11/2018   TRIG 67 04/03/2015   Lab Results  Component Value Date   CHOLHDL 4.7 09/11/2018   No results found for: LDLDIRECT    Radiology: Dg Chest 2 View  Result Date: 09/10/2018 CLINICAL DATA:  Shortness of breath EXAM: CHEST - 2 VIEW COMPARISON:  None. FINDINGS: No focal airspace disease or effusion. Borderline heart size. No pneumothorax. IMPRESSION: No active cardiopulmonary disease. Electronically Signed   By: Donavan Foil M.D.   On: 09/10/2018 15:05    EKG: Sinus bradycardia  ASSESSMENT AND PLAN:   1.  Probable vasovagal episode 2.  Borderline elevated troponin without rise or fall, the absence of chest pain or ischemic ECG changes, likely demand supply ischemia 3.  Known bradycardia 4.  Known moderate to severe aortic stenosis  Recommendations  1.  Agree with current therapy 2.  Review 2D echocardiogram 3.  Ambulate patient 4.  Probable discharge home later today, follow-up with Dr. Ubaldo Glassing  Signed: Isaias Cowman MD,PhD, [facility] 09/11/2018, 9:05 AM

## 2018-09-11 NOTE — Plan of Care (Signed)
  Problem: Education: Goal: Knowledge of General Education information will improve Description: Including pain rating scale, medication(s)/side effects and non-pharmacologic comfort measures Outcome: Completed/Met

## 2018-09-11 NOTE — Care Management Obs Status (Signed)
[place] NOTIFICATION   Patient Details  Name: SHANDON BURLINGAME MRN: [id number] Date of Birth: 1946/02/04   Medicare Observation Status Notification Given:  Yes    Kennetta Pavlovic A Chael Urenda, RN 09/11/2018, 9:43 AM

## 2018-09-11 NOTE — Progress Notes (Signed)
[place] [facility] [place] NAME: Gregory Black    MR#:  [id number]  DATE OF BIRTH:  10-Sep-1945  SUBJECTIVE:  CHIEF COMPLAINT:   Chief Complaint  Patient presents with  . Shortness of Breath   -Denies any chest pain or dizziness.  Troponins are stable at 0.06  REVIEW OF SYSTEMS:  Review of Systems  Constitutional: Negative for chills and fever.  HENT: Negative for congestion, ear discharge, hearing loss and nosebleeds.   Eyes: Negative for blurred vision and double vision.  Respiratory: Negative for cough, shortness of breath and wheezing.   Cardiovascular: Negative for chest pain, palpitations and leg swelling.  Gastrointestinal: Negative for abdominal pain, constipation, diarrhea, nausea and vomiting.  Genitourinary: Negative for dysuria.  Musculoskeletal: Negative for myalgias.  Neurological: Negative for dizziness, focal weakness, seizures, weakness and headaches.  Psychiatric/Behavioral: Negative for depression.    DRUG ALLERGIES:   Allergies  Allergen Reactions  . Pollen Extract Other (See Comments)    Sneezing, runny eyes  . Vicodin [Hydrocodone-Acetaminophen] Other (See Comments)    Unable to void or empty bowels    VITALS:  Blood pressure (!) 137/93, pulse (!) 58, temperature 97.9 F (36.6 C), temperature source Oral, resp. rate 19, height 5\' 10"  (1.778 m), weight 95.3 kg, SpO2 100 %.  PHYSICAL EXAMINATION:  Physical Exam   GENERAL:  73 y.o.-year-old patient lying in the bed with no acute distress.  EYES: Pupils equal, round, reactive to light and accommodation. No scleral icterus. Extraocular muscles intact.  HEENT: Head atraumatic, normocephalic. Oropharynx and nasopharynx clear.  NECK:  Supple, no jugular venous distention. No thyroid enlargement, no tenderness.  LUNGS: Normal breath sounds bilaterally, no wheezing, rales,rhonchi or crepitation. No use of accessory muscles of respiration.  CARDIOVASCULAR: S1, S2  normal. No  rubs, or gallops.  2/6 systolic murmur heard ABDOMEN: Soft, nontender, obese abdomen, nondistended. Bowel sounds present. No organomegaly or mass.  EXTREMITIES: No pedal edema, cyanosis, or clubbing.  NEUROLOGIC: Cranial nerves II through XII are intact. Muscle strength 5/5 in all extremities. Sensation intact. Gait not checked.  PSYCHIATRIC: The patient is alert and oriented x 3.  SKIN: No obvious rash, lesion, or ulcer.    LABORATORY PANEL:   CBC Recent Labs  Lab 09/11/18 0622  WBC 4.4  HGB 13.6  HCT 39.4  PLT 149*   ------------------------------------------------------------------------------------------------------------------  Chemistries  Recent Labs  Lab 09/11/18 0622  NA 140  K 3.8  CL 106  CO2 28  GLUCOSE 96  BUN 11  CREATININE 1.02  CALCIUM 8.6*   ------------------------------------------------------------------------------------------------------------------  Cardiac Enzymes Recent Labs  Lab 09/10/18 2236  TROPONINI 0.06*   ------------------------------------------------------------------------------------------------------------------  RADIOLOGY:  Dg Chest 2 View  Result Date: 09/10/2018 CLINICAL DATA:  Shortness of breath EXAM: CHEST - 2 VIEW COMPARISON:  None. FINDINGS: No focal airspace disease or effusion. Borderline heart size. No pneumothorax. IMPRESSION: No active cardiopulmonary disease. Electronically Signed   By: Donavan Foil M.D.   On: 09/10/2018 15:05    EKG:   Orders placed or performed during the hospital encounter of 09/10/18  . EKG 12-Lead  . EKG 12-Lead  . ED EKG  . ED EKG    ASSESSMENT AND PLAN:   73y/o male with past medical history significant for moderate aortic stenosis on echocardiogram, arthritis, history of prostate cancer, GERD presents to hospital secondary to dizziness and noted to have slightly elevated troponin.  1.  Dizziness-presyncopal episode after a sneeze and epistaxis.  Completely resolved  at this time. -No orthostatic blood pressure changes this morning noted. -Echocardiogram is pending.  Patient is up and ambulatory. -Possible discharge home later today  2.  Elevated troponin-borderline elevated troponin and stable.  Patient denies any cardiac symptoms at this time.  Has moderate aortic stenosis which can also cause this. -Appreciate cardiology input.  If echocardiogram is fine, plan for discharge today.  3.  Neuropathy-takes only B12 supplements  4.  DVT prophylaxis-Lovenox    All the records are reviewed and case discussed with Care Management/Social Workerr. Management plans discussed with the patient, family and they are in agreement.  CODE STATUS: Full code  TOTAL TIME TAKING CARE OF THIS PATIENT: 38 minutes.   POSSIBLE D/C IN 1-2 DAYS, DEPENDING ON CLINICAL CONDITION.   Gladstone Lighter M.D on 09/11/2018 at 11:01 AM  Between 7am to 6pm - Pager - [phone]  After 6pm go to [email or web address] - password [email or web address] [place] [facility]  Office  [phone]  CC: Primary care physician; Kirk Ruths, MD

## 2018-09-12 NOTE — Discharge Summary (Signed)
[facility] NAME: Gregory Black    MR#:  [id number]  DATE OF BIRTH:  [birth year]  DATE OF ADMISSION:  09/10/2018   ADMITTING PHYSICIAN: Loletha Grayer, MD  DATE OF DISCHARGE: 09/11/2018  3:40 PM  PRIMARY CARE PHYSICIAN: Kirk Ruths, MD   ADMISSION DIAGNOSIS:   Diaphoresis [R61] Elevated troponin [R79.89]  DISCHARGE DIAGNOSIS:   Active Problems:   Chest pain   SECONDARY DIAGNOSIS:   Past Medical History:  Diagnosis Date  . Aortic stenosis 07/27/14   Echo   . Arthritis    hands, knees  . Bulging lumbar disc   . Cancer [facility]) 2019   prostate  . Cardiac murmur   . Complication of anesthesia    during start of colonoscopy after medication given heart rate dropped so procedure was stopped  . History of concussion   . History of hiatal hernia   . Neuropathy   . Reflux    in past  . Weakness of both legs    pt says from Vit b12 deficiency  . Wears dentures    full upper and lower    HOSPITAL COURSE:   73y/o male with past medical history significant for moderate aortic stenosis on echocardiogram, arthritis, history of prostate cancer, GERD presents to hospital secondary to dizziness and noted to have slightly elevated troponin.  1.  Dizziness-presyncopal episode after a sneeze and epistaxis.  Completely resolved at this time. -No orthostatic blood pressure changes this morning noted.  Likely vasovagal episode -Echocardiogram is showing moderate to severe aortic stenosis.  Which is old.  No critical stenosis..  Patient is up and ambulatory. -Appreciate cardiology input.  Recommended outpatient follow-up  2.  Elevated troponin-borderline elevated troponin and stable.  Patient denies any cardiac symptoms at this time.  Has moderate aortic stenosis which can also cause this. -Appreciate cardiology input.  Echocardiogram with slight worsening of his aortic stenosis but no significant changes.  No critical  stenosis noted.  Outpatient follow-up recommended.  3.  Neuropathy-takes only B12 supplements  Patient is up and ambulatory and independent   DISCHARGE CONDITIONS:   Guarded  CONSULTS OBTAINED:    Cardiology consultation by Dr. Saralyn Pilar  DRUG ALLERGIES:   Allergies  Allergen Reactions  . Pollen Extract Other (See Comments)    Sneezing, runny eyes  . Vicodin [Hydrocodone-Acetaminophen] Other (See Comments)    Unable to void or empty bowels   DISCHARGE MEDICATIONS:   Allergies as of 09/11/2018      Reactions   Pollen Extract Other (See Comments)   Sneezing, runny eyes   Vicodin [hydrocodone-acetaminophen] Other (See Comments)   Unable to void or empty bowels      Medication List    STOP taking these medications   ibuprofen 200 MG tablet Commonly known as: ADVIL     TAKE these medications   aspirin 81 MG tablet Take 81 mg by mouth once a week. Notes to patient: Next dose due 09/12/2018 in the am   docusate sodium 100 MG capsule Commonly known as: COLACE Take 1 capsule (100 mg total) by mouth 2 (two) times daily as needed (take to keep stool soft.).   Fish Oil 1000 MG Caps Take 1,000 mg by mouth daily. Notes to patient: Next dose due 09/12/2018 in the am   multivitamin tablet Take 1 tablet by mouth daily. Notes to patient: Next dose due 09/12/2018 in the am   MUSCLE RUB EX Apply 1 application topically  daily as needed (muscle pain).   SYSTANE OP Place 1 drop into both eyes daily as needed (for dry/red eyes).   vitamin B-12 1000 MCG tablet Commonly known as: CYANOCOBALAMIN Take 1,000 mcg by mouth daily. Notes to patient: Next dose due 09/12/2018 in the am        DISCHARGE INSTRUCTIONS:   1.  PCP follow-up in 1 to 2 weeks 2.  Cardiology follow-up in 2 weeks   DIET:   Cardiac diet  ACTIVITY:   Activity as tolerated  OXYGEN:   Home Oxygen: No.  Oxygen Delivery: room air  DISCHARGE LOCATION:   home   If you experience worsening of  your admission symptoms, develop shortness of breath, life threatening emergency, suicidal or homicidal thoughts you must seek medical attention immediately by calling 911 or calling your MD immediately  if symptoms less severe.  You Must read complete instructions/literature along with all the possible adverse reactions/side effects for all the Medicines you take and that have been prescribed to you. Take any new Medicines after you have completely understood and accpet all the possible adverse reactions/side effects.   Please note  You were cared for by a hospitalist during your hospital stay. If you have any questions about your discharge medications or the care you received while you were in the hospital after you are discharged, you can call the unit and asked to speak with the hospitalist on call if the hospitalist that took care of you is not available. Once you are discharged, your primary care physician will handle any further medical issues. Please note that NO REFILLS for any discharge medications will be authorized once you are discharged, as it is imperative that you return to your primary care physician (or establish a relationship with a primary care physician if you do not have one) for your aftercare needs so that they can reassess your need for medications and monitor your lab values.    On the day of Discharge:  VITAL SIGNS:   Blood pressure (!) 137/93, pulse (!) 58, temperature 97.9 F (36.6 C), temperature source Oral, resp. rate 19, height 5\' 10"  (1.778 m), weight 95.3 kg, SpO2 100 %.  PHYSICAL EXAMINATION:   GENERAL:  73 y.o.-year-old patient lying in the bed with no acute distress.  EYES: Pupils equal, round, reactive to light and accommodation. No scleral icterus. Extraocular muscles intact.  HEENT: Head atraumatic, normocephalic. Oropharynx and nasopharynx clear.  NECK:  Supple, no jugular venous distention. No thyroid enlargement, no tenderness.  LUNGS: Normal breath  sounds bilaterally, no wheezing, rales,rhonchi or crepitation. No use of accessory muscles of respiration.  CARDIOVASCULAR: S1, S2 normal. No  rubs, or gallops.  2/6 systolic murmur heard ABDOMEN: Soft, nontender, obese abdomen, nondistended. Bowel sounds present. No organomegaly or mass.  EXTREMITIES: No pedal edema, cyanosis, or clubbing.  NEUROLOGIC: Cranial nerves II through XII are intact. Muscle strength 5/5 in all extremities. Sensation intact. Gait not checked.  PSYCHIATRIC: The patient is alert and oriented x 3.  SKIN: No obvious rash, lesion, or ulcer.   DATA REVIEW:   CBC Recent Labs  Lab 09/11/18 0622  WBC 4.4  HGB 13.6  HCT 39.4  PLT 149*    Chemistries  Recent Labs  Lab 09/11/18 0622  NA 140  K 3.8  CL 106  CO2 28  GLUCOSE 96  BUN 11  CREATININE 1.02  CALCIUM 8.6*     Microbiology Results  Results for orders placed or performed during the hospital encounter  of 09/10/18  SARS Coronavirus 2 (CEPHEID- Performed in [place] lab), Hosp Order     Status: None   Collection Time: 09/10/18  4:50 PM   Specimen: Nasopharyngeal Swab  Result Value Ref Range Status   SARS Coronavirus 2 NEGATIVE NEGATIVE Final    Comment: (NOTE) If result is NEGATIVE SARS-CoV-2 target nucleic acids are NOT DETECTED. The SARS-CoV-2 RNA is generally detectable in upper and lower  respiratory specimens during the acute phase of infection. The lowest  concentration of SARS-CoV-2 viral copies this assay can detect is 250  copies / mL. A negative result does not preclude SARS-CoV-2 infection  and should not be used as the sole basis for treatment or other  patient management decisions.  A negative result may occur with  improper specimen collection / handling, submission of specimen other  than nasopharyngeal swab, presence of viral mutation(s) within the  areas targeted by this assay, and inadequate number of viral copies  (<250 copies / mL). A negative result must be  combined with clinical  observations, patient history, and epidemiological information. If result is POSITIVE SARS-CoV-2 target nucleic acids are DETECTED. The SARS-CoV-2 RNA is generally detectable in upper and lower  respiratory specimens dur ing the acute phase of infection.  Positive  results are indicative of active infection with SARS-CoV-2.  Clinical  correlation with patient history and other diagnostic information is  necessary to determine patient infection status.  Positive results do  not rule out bacterial infection or co-infection with other viruses. If result is PRESUMPTIVE POSTIVE SARS-CoV-2 nucleic acids MAY BE PRESENT.   A presumptive positive result was obtained on the submitted specimen  and confirmed on repeat testing.  While 2019 novel coronavirus  (SARS-CoV-2) nucleic acids may be present in the submitted sample  additional confirmatory testing may be necessary for epidemiological  and / or clinical management purposes  to differentiate between  SARS-CoV-2 and other Sarbecovirus currently known to infect humans.  If clinically indicated additional testing with an alternate test  methodology [phone]) is advised. The SARS-CoV-2 RNA is generally  detectable in upper and lower respiratory sp ecimens during the acute  phase of infection. The expected result is Negative. Fact Sheet for Patients:  StrictlyIdeas.no Fact Sheet for Healthcare Providers: BankingDealers.co.za This test is not yet approved or cleared by the [place] FDA and has been authorized for detection and/or diagnosis of SARS-CoV-2 by FDA under an Emergency Use Authorization (EUA).  This EUA will remain in effect (meaning this test can be used) for the duration of the COVID-19 declaration under Section 564(b)(1) of the Act, 21 U.S.C. section 360bbb-3(b)(1), unless the authorization is terminated or revoked sooner. Performed at [facility], [address] [place], [place] [address]     RADIOLOGY:  No results found.   Management plans discussed with the patient, family and they are in agreement.  CODE STATUS:  Code Status History    Date Active Date Inactive Code Status Order ID Comments User Context   09/10/2018 1858 09/11/2018 2010 Full Code [id number]  Loletha Grayer, MD ED   Advance Care Planning Activity      TOTAL TIME TAKING CARE OF THIS PATIENT: 38 minutes.    Gladstone Lighter M.D on 09/12/2018 at 11:08 AM  Between 7am to 6pm - Pager - [phone]  After 6pm go to [email or web address] - Technical brewer [facility]  Office  [phone]  CC: Primary care physician; Kirk Ruths, MD  Note: This dictation was prepared with Dragon dictation along with smaller phrase technology. Any transcriptional errors that result from this process are unintentional.

## 2018-11-10 ENCOUNTER — Other Ambulatory Visit: Payer: Self-pay | Admitting: Cardiology

## 2018-11-10 MED ORDER — SODIUM CHLORIDE 0.9% FLUSH
3.0000 mL | Freq: Two times a day (BID) | INTRAVENOUS | Status: AC
  Administered 2022-10-06 (×2): 3 mL via INTRAVENOUS

## 2018-11-11 ENCOUNTER — Other Ambulatory Visit: Payer: Self-pay

## 2018-11-11 ENCOUNTER — Other Ambulatory Visit
Admission: RE | Admit: 2018-11-11 | Discharge: 2018-11-11 | Disposition: A | Discharge status: Home / Self Care | Payer: Medicare HMO | Source: Ambulatory Visit | Attending: Cardiology | Admitting: Cardiology

## 2018-11-11 DIAGNOSIS — Z20828 Contact with and (suspected) exposure to other viral communicable diseases: Secondary | ICD-10-CM | POA: Insufficient documentation

## 2018-11-11 DIAGNOSIS — Z01812 Encounter for preprocedural laboratory examination: Secondary | ICD-10-CM | POA: Insufficient documentation

## 2018-11-11 LAB — SARS CORONAVIRUS 2 (TAT 6-24 HRS): SARS Coronavirus 2: NEGATIVE

## 2018-11-15 ENCOUNTER — Encounter
Admission: RE | Disposition: A | Discharge status: Home / Self Care | Payer: Self-pay | Source: Ambulatory Visit | Attending: Cardiology

## 2018-11-15 ENCOUNTER — Ambulatory Visit
Admission: RE | Admit: 2018-11-15 | Discharge: 2018-11-15 | Disposition: A | Discharge status: Home / Self Care | Payer: Medicare HMO | Source: Ambulatory Visit | Attending: Cardiology | Admitting: Cardiology

## 2018-11-15 ENCOUNTER — Other Ambulatory Visit: Payer: Self-pay

## 2018-11-15 DIAGNOSIS — I359 Nonrheumatic aortic valve disorder, unspecified: Secondary | ICD-10-CM

## 2018-11-15 DIAGNOSIS — Z8619 Personal history of other infectious and parasitic diseases: Secondary | ICD-10-CM | POA: Diagnosis not present

## 2018-11-15 DIAGNOSIS — Z885 Allergy status to narcotic agent status: Secondary | ICD-10-CM | POA: Insufficient documentation

## 2018-11-15 DIAGNOSIS — Z7982 Long term (current) use of aspirin: Secondary | ICD-10-CM | POA: Insufficient documentation

## 2018-11-15 DIAGNOSIS — Z823 Family history of stroke: Secondary | ICD-10-CM | POA: Diagnosis not present

## 2018-11-15 DIAGNOSIS — Z8249 Family history of ischemic heart disease and other diseases of the circulatory system: Secondary | ICD-10-CM | POA: Diagnosis not present

## 2018-11-15 DIAGNOSIS — R001 Bradycardia, unspecified: Secondary | ICD-10-CM | POA: Insufficient documentation

## 2018-11-15 DIAGNOSIS — E7800 Pure hypercholesterolemia, unspecified: Secondary | ICD-10-CM | POA: Diagnosis not present

## 2018-11-15 DIAGNOSIS — Z87891 Personal history of nicotine dependence: Secondary | ICD-10-CM | POA: Insufficient documentation

## 2018-11-15 DIAGNOSIS — I060 Rheumatic aortic stenosis: Secondary | ICD-10-CM | POA: Insufficient documentation

## 2018-11-15 DIAGNOSIS — G629 Polyneuropathy, unspecified: Secondary | ICD-10-CM | POA: Insufficient documentation

## 2018-11-15 DIAGNOSIS — E785 Hyperlipidemia, unspecified: Secondary | ICD-10-CM | POA: Diagnosis not present

## 2018-11-15 DIAGNOSIS — K760 Fatty (change of) liver, not elsewhere classified: Secondary | ICD-10-CM | POA: Insufficient documentation

## 2018-11-15 HISTORY — PX: RIGHT/LEFT HEART CATH AND CORONARY ANGIOGRAPHY: CATH118266

## 2018-11-15 SURGERY — RIGHT/LEFT HEART CATH AND CORONARY ANGIOGRAPHY
Anesthesia: Moderate Sedation | Laterality: Bilateral

## 2018-11-15 MED ORDER — SODIUM CHLORIDE 0.9 % WEIGHT BASED INFUSION: 1.0000 mL/kg/h | INTRAVENOUS | Status: DC

## 2018-11-15 MED ORDER — HEPARIN (PORCINE) IN NACL 1000-0.9 UT/500ML-% IV SOLN
INTRAVENOUS | Status: DC | PRN
  Administered 2018-11-15: 500 mL

## 2018-11-15 MED ORDER — IOHEXOL 300 MG/ML  SOLN
INTRAMUSCULAR | Status: DC | PRN
  Administered 2018-11-15: 10:00:00 80 mL via INTRA_ARTERIAL

## 2018-11-15 MED ORDER — MIDAZOLAM HCL 2 MG/2ML IJ SOLN
INTRAMUSCULAR | Status: AC
  Filled 2018-11-15: qty 2

## 2018-11-15 MED ORDER — ASPIRIN 81 MG PO CHEW
CHEWABLE_TABLET | ORAL | Status: AC
  Filled 2018-11-15: qty 1

## 2018-11-15 MED ORDER — ASPIRIN 81 MG PO CHEW
81.0000 mg | CHEWABLE_TABLET | ORAL | Status: AC
  Administered 2018-11-15: 81 mg via ORAL

## 2018-11-15 MED ORDER — FENTANYL CITRATE (PF) 100 MCG/2ML IJ SOLN
INTRAMUSCULAR | Status: AC
  Filled 2018-11-15: qty 2

## 2018-11-15 MED ORDER — FENTANYL CITRATE (PF) 100 MCG/2ML IJ SOLN
INTRAMUSCULAR | Status: DC | PRN
  Administered 2018-11-15: 25 ug via INTRAVENOUS

## 2018-11-15 MED ORDER — MIDAZOLAM HCL 2 MG/2ML IJ SOLN
INTRAMUSCULAR | Status: DC | PRN
  Administered 2018-11-15: 1 mg via INTRAVENOUS

## 2018-11-15 MED ORDER — SODIUM CHLORIDE 0.9 % IV SOLN: 250.0000 mL | INTRAVENOUS | Status: DC | PRN

## 2018-11-15 MED ORDER — SODIUM CHLORIDE 0.9% FLUSH: 3.0000 mL | INTRAVENOUS | Status: DC | PRN

## 2018-11-15 MED ORDER — HEPARIN (PORCINE) IN NACL 1000-0.9 UT/500ML-% IV SOLN
INTRAVENOUS | Status: AC
  Filled 2018-11-15: qty 1000

## 2018-11-15 MED ORDER — SODIUM CHLORIDE 0.9 % WEIGHT BASED INFUSION
3.0000 mL/kg/h | INTRAVENOUS | Status: AC
  Administered 2018-11-15: 3 mL/kg/h via INTRAVENOUS

## 2018-11-15 SURGICAL SUPPLY — 14 items
CATH INFINITI 5FR ANG PIGTAIL (CATHETERS) IMPLANT
CATH INFINITI 5FR JL4 (CATHETERS) ×3 IMPLANT
CATH INFINITI 5FR JL5 (CATHETERS) ×3 IMPLANT
CATH INFINITI JR4 5F (CATHETERS) ×3 IMPLANT
CATH SWANZ 7F THERMO (CATHETERS) ×3 IMPLANT
DEVICE CLOSURE MYNXGRIP 5F (Vascular Products) ×3 IMPLANT
GUIDEWIRE EMER 3M J .025X150CM (WIRE) ×3 IMPLANT
KIT MANI 3VAL PERCEP (MISCELLANEOUS) ×3 IMPLANT
KIT RIGHT HEART (MISCELLANEOUS) ×3 IMPLANT
NEEDLE PERC 18GX7CM (NEEDLE) ×3 IMPLANT
PACK CARDIAC CATH (CUSTOM PROCEDURE TRAY) ×3 IMPLANT
SHEATH AVANTI 5FR X 11CM (SHEATH) ×3 IMPLANT
SHEATH AVANTI 7FRX11 (SHEATH) ×3 IMPLANT
WIRE GUIDERIGHT .035X150 (WIRE) ×3 IMPLANT

## 2018-11-15 NOTE — Discharge Instructions (Signed)
Aortic Valve Stenosis  Aortic valve stenosis is a narrowing of the aortic valve in the heart. The aortic valve opens and closes to regulate blood flow between the left side of the heart (left ventricle) and the artery that leads away from the heart (aorta). When the aortic valve becomes narrow, it is difficult for the heart to pump blood out to the body, which causes the heart to work harder. The extra work can weaken the heart muscle over time. Aortic valve stenosis can range from mild to severe. If it is not treated, it can become more severe over time and lead to heart failure. What are the causes? This condition may be caused by:  Buildup of calcium around and on the aortic valve. This can occur with aging. This is the most common cause of aortic valve stenosis.  A heart problem that developed in the womb (birth defect).  Rheumatic fever.  Radiation to the chest. What increases the risk? You may be more likely to develop this condition if:  You are older than age 102.  You were born with an abnormal bicuspid valve. What are the signs or symptoms? You may not have any symptoms until your condition becomes severe. It may take 10-20 years for mild or moderate aortic valve stenosis to become severe. Symptoms may include:  Shortness of breath. This may get worse during physical activity.  Feeling unusually weak and tired (fatigue).  Extreme discomfort in the chest, neck, or arm during physical activity (angina).  A heartbeat that is irregular or faster than normal (palpitations).  Dizziness or fainting. This may happen when you get physically tired or after you take certain heart medicines, such as nitroglycerin. How is this diagnosed? This condition may be diagnosed with:  A physical exam.  Echocardiogram. This is a type of imaging test that uses sound waves (ultrasound) to make images of your heart. There are two kinds of this test that may be used. ? Transthoracic  echocardiogram (TTE). For this type, a wand-like tool (transducer) is moved over your chest to create ultrasound images that are recorded by a computer. ? Transesophageal echocardiogram (TEE). For this type, a flexible tube (probe) is inserted down the part of the body that moves food from your mouth to your stomach (esophagus). The heart and the esophagus are close to each other. Your health care provider will use the probe to take clear, detailed pictures of the heart.  Cardiac catheterization. For this procedure, a small, thin tube (catheter) is passed through a large vein in your neck, groin, or arm. The catheter is used to get information about arteries, structures, blood pressure, and oxygen levels in your heart.  Stress tests. These are tests that evaluate the blood supply to your heart and your heart's response to exercise. You may work with a health care provider who specializes in the heart (cardiologist) for diagnosis and treatment. How is this treated? Treatment depends on how severe your condition is and what your symptoms are. You will need to have your heart checked regularly to make sure that your condition is not getting worse or causing serious problems. Treatment may also include:  Surgery to replace your aortic valve. This is the most common treatment for aortic valve stenosis, and it is the only treatment to cure the condition. Several types of surgeries are available. The surgery may be done: ? Through a large incision over your heart (open-heart surgery). ? Through small incisions, using a flexible tube called a catheter (  transcatheter aortic valve replacement, TAVR).  Medicines that help to keep your heart rate regular.  Medicines that thin your blood (anticoagulants) to prevent blood clots.  Antibiotic medicines to help prevent infection. If your condition is mild, you may only need regular follow-up visits for monitoring. Follow these instructions at  home: Lifestyle  Limit alcohol intake to no more than 1 drink a day for nonpregnant women and 2 drinks a day for men. One drink equals 12 oz of beer, 5 oz of wine, or 1 oz of hard liquor.  Do not use any products that contain nicotine or tobacco, such as cigarettes and e-cigarettes. If you need help quitting, ask your health care provider.  Work with your health care provider to manage your blood pressure and cholesterol.  Maintain a healthy weight. Eating and drinking   Eat a heart-healthy diet that includes plenty of fresh fruits and vegetables, whole grains, lean protein, and low-fat or nonfat dairy.  Limit how much caffeine you drink. Caffeine can affect your heart's rate and rhythm.  Avoid foods that are: ? High in salt (sodium), saturated fat, or sugar. ? Canned or highly processed. ? Fried.  Follow instructions from your health care provider about any other eating or drinking restrictions. Activity  Exercise regularly and return to your normal activities as told by your health care provider. Ask your health care provider what amount and type of physical activity is safe for you. ? If your aortic valve stenosis is mild, you may only need to avoid very intense physical activity, such as heavy weight lifting. ? The more severe your aortic valve stenosis is, the more activities you may need to avoid. If you are taking blood thinners:  Before you take any medicines that contain aspirin or NSAIDs, talk with your health care provider. These medicines increase your risk for dangerous bleeding.  Take your medicine exactly as told, at the same time every day.  Avoid activities that could cause injury or bruising, and follow instructions about how to prevent falls.  Wear a medical alert bracelet or carry a card that lists what medicines you take. General instructions  Take over-the-counter and prescription medicines only as told by your health care provider.  If you were  prescribed an antibiotic, take it as told by your health care provider. Do not stop taking the antibiotic even if you start to feel better.  If you are a woman and you plan to become pregnant, talk with your health care provider before you become pregnant.  Before you have any type of medical or dental procedure or surgery, tell all health care providers that you have aortic valve stenosis. This may affect the treatment that you receive.  Keep all follow-up visits as told by your health care provider. This is important. Contact a health care provider if:  You have a fever. Get help right away if:  You develop any of the following symptoms: ? Chest pain. ? Chest tightness. ? Shortness of breath. ? Trouble breathing.  You feel light-headed.  You feel like you might faint.  Your heartbeat is irregular or faster than normal. These symptoms may represent a serious problem that is an emergency. Do not wait to see if the symptoms will go away. Get medical help right away. Call your local emergency services (911 in the [place] Do not drive yourself to the hospital. Summary  Aortic valve stenosis is a narrowing of the aortic valve in the heart. The aortic valve opens and  closes to regulate blood flow between the left side of the heart (left ventricle) and the artery that leads away from the heart (aorta).  Aortic valve stenosis can range from mild to severe. If it is not treated, it can become more severe over time and lead to heart failure.  Treatment depends on how severe your condition is and what your symptoms are. You will need to have your heart checked regularly to make sure that your condition is not getting worse or causing serious problems.  Exercise regularly and return to your normal activities as told by your health care provider. Ask your health care provider what amount and type of physical activity is safe for you. This information is not intended to replace advice given to you  by your health care provider. Make sure you discuss any questions you have with your health care provider. Document Released: 12/06/2002 Document Revised: 02/19/2017 Document Reviewed: 12/10/2016 Elsevier Patient Education  2020 [place] After This sheet gives you information about how to care for yourself after your procedure. Your doctor may also give you more specific instructions. If you have problems or questions, contact your doctor. Follow these instructions at home: Insertion site care  Follow instructions from your doctor about how to take care of your long, thin tube (catheter) insertion area. Make sure you: ? Wash your hands with soap and water before you change your bandage (dressing). If you cannot use soap and water, use hand sanitizer. ? Change your bandage as told by your doctor. ? Leave stitches (sutures), skin glue, or skin tape (adhesive) strips in place. They may need to stay in place for 2 weeks or longer. If tape strips get loose and curl up, you may trim the loose edges. Do not remove tape strips completely unless your doctor says it is okay.  Do not take baths, swim, or use a hot tub until your doctor says it is okay.  You may shower 24-48 hours after the procedure or as told by your doctor. ? Gently wash the area with plain soap and water. ? Pat the area dry with a clean towel. ? Do not rub the area. This may cause bleeding.  Do not apply powder or lotion to the area. Keep the area clean and dry.  Check your insertion area every day for signs of infection. Check for: ? More redness, swelling, or pain. ? Fluid or blood. ? Warmth. ? Pus or a bad smell. Activity  Rest as told by your doctor, usually for 1-2 days.  Do not lift anything that is heavier than 10 lbs. (4.5 kg) or as told by your doctor.  Do not drive for 24 hours if you were given a medicine to help you relax (sedative).  Do not drive or use heavy machinery while taking  prescription pain medicine. General instructions   Go back to your normal activities as told by your doctor, usually in about a week. Ask your doctor what activities are safe for you.  If the insertion area starts to bleed, lie flat and put pressure on the area. If the bleeding does not stop, get help right away. This is an emergency.  Drink enough fluid to keep your pee (urine) clear or pale yellow.  Take over-the-counter and prescription medicines only as told by your doctor.  Keep all follow-up visits as told by your doctor. This is important. Contact a doctor if:  You have a fever.  You have chills.  You have more redness, swelling, or pain around your insertion area.  You have fluid or blood coming from your insertion area.  The insertion area feels warm to the touch.  You have pus or a bad smell coming from your insertion area.  You have more bruising around the insertion area.  Blood collects in the tissue around the insertion area (hematoma) that may be painful to the touch. Get help right away if:  You have a lot of pain in the insertion area.  The insertion area swells very fast.  The insertion area is bleeding, and the bleeding does not stop after holding steady pressure on the area.  The area near or just beyond the insertion area becomes pale, cool, tingly, or numb. These symptoms may be an emergency. Do not wait to see if the symptoms will go away. Get medical help right away. Call your local emergency services (911 in the [place] Do not drive yourself to the hospital. Summary  After the procedure, it is common to have bruising and tenderness at the long, thin tube insertion area.  After the procedure, it is important to rest and drink plenty of fluids.  Do not take baths, swim, or use a hot tub until your doctor says it is okay to do so. You may shower 24-48 hours after the procedure or as told by your doctor.  If the insertion area starts to bleed, lie  flat and put pressure on the area. If the bleeding does not stop, get help right away. This is an emergency. This information is not intended to replace advice given to you by your health care provider. Make sure you discuss any questions you have with your health care provider. Document Released: 06/05/2008 Document Revised: 02/19/2017 Document Reviewed: 03/03/2016 Elsevier Patient Education  [place].    Moderate Conscious Sedation, Adult, Care After These instructions provide you with information about caring for yourself after your procedure. Your health care provider may also give you more specific instructions. Your treatment has been planned according to current medical practices, but problems sometimes occur. Call your health care provider if you have any problems or questions after your procedure. What can I expect after the procedure? After your procedure, it is common:  To feel sleepy for several hours.  To feel clumsy and have poor balance for several hours.  To have poor judgment for several hours.  To vomit if you eat too soon. Follow these instructions at home: For at least 24 hours after the procedure:   Do not: ? Participate in activities where you could fall or become injured. ? Drive. ? Use heavy machinery. ? Drink alcohol. ? Take sleeping pills or medicines that cause drowsiness. ? Make important decisions or sign legal documents. ? Take care of children on your own.  Rest. Eating and drinking  Follow the diet recommended by your health care provider.  If you vomit: ? Drink water, juice, or soup when you can drink without vomiting. ? Make sure you have little or no nausea before eating solid foods. General instructions  Have a responsible adult stay with you until you are awake and alert.  Take over-the-counter and prescription medicines only as told by your health care provider.  If you smoke, do not smoke without supervision.  Keep all  follow-up visits as told by your health care provider. This is important. Contact a health care provider if:  You keep feeling nauseous or you keep vomiting.  You feel  light-headed.  You develop a rash.  You have a fever. Get help right away if:  You have trouble breathing. This information is not intended to replace advice given to you by your health care provider. Make sure you discuss any questions you have with your health care provider. Document Released: 12/28/2012 Document Revised: 02/19/2017 Document Reviewed: 06/29/2015 Elsevier Patient Education  2020 [employer].

## 2018-11-15 NOTE — H&P (Signed)
Chief Complaint: Chief Complaint  Patient presents with  . 3 week follow up  Date of Service: 11/10/2018 Date of Birth: 08-08-1945 PCP: Harrold Donath, MD  History of Present Illness: Gregory Black is a 73 y.o.male patient with a past medical history significant for severe aortic stenosis, asymptomatic bradycardia, and hypercholesterolemia who presents to review Holter monitor results. He continues to run on a regular basis and admits to mid-sternal, exertional chest pain with associated shortness of breath and palpitations, occurring approximately 5-10 minutes after the onset of exercise. He also admits to a recent near syncopal event in which he felt dizzy, lightheaded, and had double vision. He did not fall or lose consciousness during this episode. He admits to mild lower extremity swelling, but denies orthopnea or PND.   We discussed the results of the Holter monitor at today's visit which revealed sinus bradycardia with an average heart rate of 58bpm. There were rare PVCs and PACs. No evidence of sustained arrhythmias. No pauses. Transthoracic echocardiogram on 09/11/18 revealed normal LV systolic function with an EF estimated between 60-65% with mild MR, mild TR, and severe aortic stenosis with a valve area of .65cm2, a peak gradient of 91.60mmHg and a mean gradient of 59.9mmHg.   Past Medical and Surgical History  Past Medical History Past Medical History:  Diagnosis Date  . Chickenpox  . Congenital multiple renal cysts  . Fatty liver  . Hyperlipidemia  . Nonrheumatic aortic (valve) stenosis  . Polyneuropathy 05/27/2015  . Seasonal allergies   Past Surgical History He has a past surgical history that includes Tonsillectomy.   Medications and Allergies  Current Medications  Current Outpatient Medications  Medication Sig Dispense Refill  . aspirin 81 MG EC tablet Take 1 tablet by mouth once daily.  . cyanocobalamin (VITAMIN B-12) 1000 MCG tablet Take by mouth. Take  1,000 mcg by mouth daily  . DOCOSAHEXANOIC ACID/EPA (FISH OIL ORAL) Take by mouth.  . FOLIC ACID/MULTIVIT-MIN/LUTEIN (CENTRUM SILVER ORAL) Take 1 tablet by mouth once daily.   No current facility-administered medications for this visit.   Allergies: Bee pollen and Hydrocodone-acetaminophen  Social and Family History  Social History reports that he has quit smoking. He has never used smokeless tobacco. He reports current alcohol use. He reports that he does not use drugs.  Family History Family History  Problem Relation Age of Onset  . Stroke Mother  . High blood pressure (Hypertension) Mother  . Back Pain Mother  . Cancer Father   Review of Systems   Review of Systems: The patient denies chest pain, shortness of breath, orthopnea, paroxysmal nocturnal dyspnea, pedal edema, palpitations, heart racing, fatigue, dizziness, lightheadedness, presyncope, syncope, leg pain, leg cramping. Review of 12 Systems is negative except as described in HPI.   Physical Examination   Vitals:BP (!) 130/90  Pulse 64  Resp 16  Ht 172.7 cm (5\' 8" )  Wt 89.8 kg (198 lb)  BMI 30.11 kg/m  Ht:172.7 cm (5\' 8" ) Wt:89.8 kg (198 lb) [id number] surface area is 2.08 meters squared. Body mass index is 30.11 kg/m.  General: Well developed, well nourished. In no acute distress HEENT: Pupils equally reactive to light and accomodation  Neck: Supple without thyromegaly, or goiter. Carotid pulses 2+. No carotid bruits present.  Pulmonary: Clear to auscultation bilaterally; no wheezes, rales, rhonchi Cardiovascular: Regular rate and rhythm. 3/6 crescendo decrescendo systolic murmur. No gallops or rubs  Gastrointestinal: Soft nontender, nondistended, with normal bowel sounds Extremities: No cyanosis or clubbing. Trace to mild peripheral  edema in bilateral lower extremities  Peripheral Pulses: 2+ in upper extremities, 2+ in lower extremities  Neurology: Alert and oriented X3 Pysch: Good affect. Responds  appropriately  Assessment and Plan   73 y.o. male with  1. Bradycardia, sinus  -Patient appears asymptomatic from this; Holter monitor was negative for AV dissociation or prolonged pauses  -Caution use of rate lowering medications  2. Rheumatic aortic stenosis  -Severe AS per recent echocardiogram; will proceed with referral to [facility] Cardi[facility] TAVR team for consideration  -Right and left heart catheterization scheduled at [facility] with Dr. Bartholome Bill on 11/15/18; benefits and risks of the procedure including infection, hematoma, and possibility of stroke/MI were discussed in thorough detail; patient voiced understanding and wishes to proceed  3. Hypercholesterolemia  -Continue fish oil; most recent LDL calculated at 116     Orders Placed This Encounter  Procedures  . CBC w/auto Differential (5 Part)  . Basic Metabolic Panel (BMP)  . Ambulatory Referral to Cardiothoracic Surgery   Greater than 50% of the 45 minute visit was spent counseling the patient on the risks and benefits of a right and left heart catheterization, as well as the need for the referral for consideration of TAVR for severe aortic stenosis.   Return after cath.   Gregory Docker Lelia Jons MD  Pt seen and examined. No change from above.

## 2018-11-17 ENCOUNTER — Other Ambulatory Visit: Payer: Self-pay

## 2018-11-17 ENCOUNTER — Encounter: Payer: Self-pay | Admitting: Thoracic Surgery (Cardiothoracic Vascular Surgery)

## 2018-11-17 ENCOUNTER — Institutional Professional Consult (permissible substitution): Payer: Medicare HMO | Admitting: Thoracic Surgery (Cardiothoracic Vascular Surgery)

## 2018-11-17 VITALS — BP 153/81 | HR 60 | Temp 97.7°F | Resp 16 | Ht 70.0 in | Wt 195.0 lb

## 2018-11-17 DIAGNOSIS — I350 Nonrheumatic aortic (valve) stenosis: Secondary | ICD-10-CM | POA: Diagnosis not present

## 2018-11-17 NOTE — Patient Instructions (Addendum)
   Continue taking all current medications without change through the day before surgery.  Have nothing to eat or drink after midnight the night before surgery.  On the morning of surgery do not take any medications   

## 2018-11-17 NOTE — Progress Notes (Signed)
[facility]  CARDIOTHORACIC SURGERY CONSULTATION REPORT  Referring Provider is Fath, Javier Docker, MD PCP is Kirk Ruths, MD  Chief Complaint  Patient presents with  . Aortic Stenosis    Surgical/TAVR eval, Cardiac Cath 11/15/18, ECHO 09/11/18    HPI:  Patient is a 73 year old male with history of aortic stenosis, sinus bradycardia, prostate cancer and degenerative arthritis who has been referred for surgical consultation to discuss treatment options for management of severe symptomatic aortic stenosis.  Patient has known of presence of a heart murmur for more than 10 years.  He has been followed regularly by Dr. Ubaldo Glassing using serial echocardiograms that have documented the presence of aortic stenosis that has gradually progressed in severity.  Patient has remained healthy and physically active until fairly recently when he began to experience episodes of exertional chest tightness and shortness of breath that only occur with moderate or strenuous physical activity.  In June the patient was working outdoors when he suffered a syncopal event.  He was taken to [facility] where troponin levels were minimally elevated.  Follow-up echocardiogram revealed normal left ventricular systolic function with severe aortic stenosis.  The patient has been followed since then by Dr. Ubaldo Glassing and underwent Holter monitor that revealed sinus bradycardia with no evidence for AV block or symptomatic bradycardia.  Diagnostic cardiac catheterization was performed demonstrating normal coronary artery anatomy with no significant coronary artery disease.  Cardiothoracic surgical consultation was requested.  Patient is married and lives with his wife in [place].  He is accompanied by his niece for his office consultation visit today.  The patient has been retired since 2015 and has remained reasonably active physically.  Mobility is slightly limited because of degenerative  arthritis in his right knee.  However, the patient drives a car and performs fairly rigorous chores and physical activity on a regular basis.  He admits to symptoms of exertional shortness of breath and chest tightness that occur only with more strenuous activity.  He denies any history of resting shortness of breath, PND, orthopnea, or lower extremity edema.  He suffered a single syncopal episode in June.  Since then he has reported occasional dizzy spells particular when he gets up from a sitting position.  Past Medical History:  Diagnosis Date  . Aortic stenosis 07/27/14   Echo   . Arthritis    hands, knees  . Bulging lumbar disc   . Cancer [facility]) 2019   prostate  . Cardiac murmur   . Complication of anesthesia    during start of colonoscopy after medication given heart rate dropped so procedure was stopped  . History of concussion   . History of hiatal hernia   . Neuropathy   . Reflux    in past  . Weakness of both legs    pt says from Vit b12 deficiency  . Wears dentures    full upper and lower    Past Surgical History:  Procedure Laterality Date  . CATARACT EXTRACTION Left 2015  . COLONOSCOPY    . COLONOSCOPY WITH PROPOFOL N/A 05/17/2015   Procedure: COLONOSCOPY WITH PROPOFOL;  Surgeon: Lucilla Lame, MD;  Location: [facility];  Service: Endoscopy;  Laterality: N/A;  . EYE SURGERY    . KNEE ARTHROSCOPY W/ MENISCAL REPAIR Right   . RADIOACTIVE SEED IMPLANT N/A 07/26/2017   Procedure: RADIOACTIVE SEED IMPLANT/BRACHYTHERAPY IMPLANT;  Surgeon: Hollice Espy, MD;  Location: [facility];  Service: Urology;  Laterality: N/A;  .  RIGHT/LEFT HEART CATH AND CORONARY ANGIOGRAPHY Bilateral 11/15/2018   Procedure: RIGHT/LEFT HEART CATH AND CORONARY ANGIOGRAPHY;  Surgeon: Teodoro Spray, MD;  Location: [place];  Service: Cardiovascular;  Laterality: Bilateral;  . TONSILLECTOMY    . TONSILLECTOMY AND ADENOIDECTOMY      Family History  Problem Relation Age of Onset  .  Heart disease Mother   . Stroke Mother   . Diabetes Mother        high blood sugar  . Hypertension Mother   . Cancer Father   . Arthritis Father   . Cancer Brother        throat  . Diabetes Brother   . Lung disease Brother        asbestos    Social History   Socioeconomic History  . Marital status: Married    Spouse name: Not on file  . Number of children: Not on file  . Years of education: Not on file  . Highest education level: Not on file  Occupational History  . Not on file  Social Needs  . Financial resource strain: Not on file  . Food insecurity    Worry: Not on file    Inability: Not on file  . Transportation needs    Medical: Not on file    Non-medical: Not on file  Tobacco Use  . Smoking status: Former Research scientist (life sciences)  . Smokeless tobacco: Never Used  . Tobacco comment: quit approx 2010  Substance and Sexual Activity  . Alcohol use: Yes    Alcohol/week: 1.0 standard drinks    Types: 1 Cans of beer per week    Comment: 1 beer a week  . Drug use: No  . Sexual activity: Not on file  Lifestyle  . Physical activity    Days per week: Not on file    Minutes per session: Not on file  . Stress: Not on file  Relationships  . Social Herbalist on phone: Not on file    Gets together: Not on file    Attends religious service: Not on file    Active member of club or organization: Not on file    Attends meetings of clubs or organizations: Not on file    Relationship status: Not on file  . Intimate partner violence    Fear of current or ex partner: Not on file    Emotionally abused: Not on file    Physically abused: Not on file    Forced sexual activity: Not on file  Other Topics Concern  . Not on file  Social History Narrative  . Not on file    Current Outpatient Medications  Medication Sig Dispense Refill  . aspirin 81 MG tablet Take 81 mg by mouth once a week.     . Multiple Vitamin (MULTIVITAMIN WITH MINERALS) TABS tablet Take 1 tablet by mouth  daily.    . Omega-3 Fatty Acids (FISH OIL) 1000 MG CAPS Take 1,000 mg by mouth daily.    . vitamin B-12 (CYANOCOBALAMIN) 1000 MCG tablet Take 1,000 mcg by mouth daily.     No current facility-administered medications for this visit.    Facility-Administered Medications Ordered in Other Visits  Medication Dose Route Frequency Provider Last Rate Last Dose  . sodium chloride flush (NS) 0.9 % injection 3 mL  3 mL Intravenous Q12H Teodoro Spray, MD        Allergies  Allergen Reactions  . Vicodin [Hydrocodone-Acetaminophen] Other (See Comments)  Unable to void or empty bowels  . Pollen Extract Other (See Comments)    Sneezing, runny eyes      Review of Systems:   General:  normal appetite, normal energy, no weight gain, no weight loss, no fever  Cardiac:  + chest pain with exertion, no chest pain at rest, +SOB with exertion, no resting SOB, no PND, no orthopnea, no palpitations, no arrhythmia, no atrial fibrillation, no LE edema, + dizzy spells, + syncope  Respiratory:  no shortness of breath, no home oxygen, no productive cough, + dry cough, no bronchitis, no wheezing, no hemoptysis, no asthma, no pain with inspiration or cough, no sleep apnea, no CPAP at night  GI:   no difficulty swallowing, no reflux, no frequent heartburn, no hiatal hernia, no abdominal pain, no constipation, no diarrhea, no hematochezia, no hematemesis, no melena  GU:   no dysuria,  + frequency, no urinary tract infection, no hematuria, + enlarged prostate, no kidney stones, no kidney disease  Vascular:  no pain suggestive of claudication, + pain in feet, + leg cramps, no varicose veins, no DVT, no non-healing foot ulcer  Neuro:   no stroke, no TIA's, no seizures, no headaches, no temporary blindness one eye,  no slurred speech, no peripheral neuropathy, no chronic pain, no instability of gait, no memory/cognitive dysfunction  Musculoskeletal: + arthritis, + joint swelling, no myalgias, minor difficulty walking,  normal mobility   Skin:   + mild but diffuse rash upper chest for past several weeks - possible poison oak exposure reported, + itching, no skin infections, no pressure sores or ulcerations  Psych:   no anxiety, no depression, no nervousness, no unusual recent stress  Eyes:   + blurry vision, + floaters, no recent vision changes, + wears glasses or contacts  ENT:   + hearing loss, no loose or painful teeth, edentulous w/ full dentures  Hematologic:  no easy bruising, no abnormal bleeding, no clotting disorder, no frequent epistaxis  Endocrine:  no diabetes, does not check CBG's at home           Physical Exam:   BP (!) 153/81 (BP Location: Left Arm, Patient Position: Sitting, Cuff Size: Normal)   Pulse 60   Temp 97.7 F (36.5 C)   Resp 16   Ht 5\' 10"  (1.778 m)   Wt 195 lb (88.5 kg)   SpO2 95% Comment: RA  BMI 27.98 kg/m   General:    well-appearing  HEENT:  Unremarkable   Neck:   no JVD, no bruits, no adenopathy   Chest:   clear to auscultation, symmetrical breath sounds, no wheezes, no rhonchi   CV:   RRR, grade III/VI crescendo/decrescendo murmur heard best at RSB,  no diastolic murmur  Abdomen:  soft, non-tender, no masses   Extremities:  warm, well-perfused, pulses diminished but palpable, no LE edema  Rectal/GU  Deferred  Neuro:   Grossly non-focal and symmetrical throughout  Skin:   Clean and dry, + mild rash across upper chest, no breakdown   Diagnostic Tests:  Procedure: 2D Echo  Indications:     Aortic Stenosis   History:         Patient has prior history of Echocardiogram examinations.                  Signs/Symptoms: Murmur.   Sonographer:     LTM Referring Phys:  [id number] Loletha Grayer Diagnosing Phys: Isaias Cowman MD  IMPRESSIONS    1. The left ventricle has  normal systolic function with an ejection fraction of 60-65%. The cavity size was normal. There is mildly increased left ventricular wall thickness. Left ventricular diastolic Doppler  parameters are indeterminate.  2. The right ventricle has normal systolic function. The cavity was normal. There is no increase in right ventricular wall thickness.  3. Aortic valve regurgitation was not assessed by color flow Doppler. Severe stenosis of the aortic valve.  FINDINGS  Left Ventricle: The left ventricle has normal systolic function, with an ejection fraction of 60-65%. The cavity size was normal. There is mildly increased left ventricular wall thickness. Left ventricular diastolic Doppler parameters are indeterminate.  Right Ventricle: The right ventricle has normal systolic function. The cavity was normal. There is no increase in right ventricular wall thickness.  Left Atrium: Left atrial size was normal in size.  Right Atrium: Right atrial size was normal in size. Right atrial pressure is estimated at 10 mmHg.  Interatrial Septum: No atrial level shunt detected by color flow Doppler.  Pericardium: There is no evidence of pericardial effusion.  Mitral Valve: The mitral valve is normal in structure. Mitral valve regurgitation is mild by color flow Doppler.  Tricuspid Valve: The tricuspid valve is normal in structure. Tricuspid valve regurgitation is mild by color flow Doppler.  Aortic Valve: The aortic valve is normal in structure. Aortic valve regurgitation was not assessed by color flow Doppler. There is Severe stenosis of the aortic valve, with a calculated valve area of 0.65 cm.  Pulmonic Valve: The pulmonic valve was normal in structure. Pulmonic valve regurgitation was not assessed by color flow Doppler.  Venous: The inferior vena cava is normal in size with greater than 50% respiratory variability.    +--------------+--------++ LEFT VENTRICLE              +----------------+---------++ +--------------+--------++      Diastology                PLAX 2D                     +----------------+---------++ +--------------+--------++      LV e'  lateral:  3.64 cm/s LVIDd:        4.76 cm       +----------------+---------++ +--------------+--------++      LV E/e' lateral:18.7      LVIDs:        2.75 cm       +----------------+---------++ +--------------+--------++      LV e' medial:   3.02 cm/s LV PW:        1.61 cm       +----------------+---------++ +--------------+--------++      LV E/e' medial: 22.5      LV IVS:       1.88 cm       +----------------+---------++ +--------------+--------++ LVOT diam:    2.30 cm  +--------------+--------++ LV SV:        77 ml    +--------------+--------++ LV SV Index:  35.19    +--------------+--------++ LVOT Area:    4.15 cm +--------------+--------++                        +--------------+--------++   +------------------+---------++ LV Volumes (MOD)            +------------------+---------++ LV area d, A2C:   34.50 cm +------------------+---------++ LV area d, A4C:   31.50 cm +------------------+---------++ LV area s, A2C:   18.00 cm +------------------+---------++ LV area s, A4C:   18.00 cm +------------------+---------++ LV  major d, A2C:  8.95 cm   +------------------+---------++ LV major d, A4C:  9.06 cm   +------------------+---------++ LV major s, A2C:  7.22 cm   +------------------+---------++ LV major s, A4C:  7.88 cm   +------------------+---------++ LV vol d, MOD A2C:111.0 ml  +------------------+---------++ LV vol d, MOD A4C:95.2 ml   +------------------+---------++ LV vol s, MOD A2C:37.2 ml   +------------------+---------++ LV vol s, MOD A4C:34.9 ml   +------------------+---------++ LV SV MOD A2C:    73.8 ml   +------------------+---------++ LV SV MOD A4C:    95.2 ml   +------------------+---------++ LV SV MOD BP:     65.7 ml   +------------------+---------++  +---------------+----------++ RIGHT VENTRICLE            +---------------+----------++ RV S prime:    10.60 cm/s +---------------+----------++ TAPSE (M-mode):1.8 cm     +---------------+----------++  +---------------+-------++-----------++ LEFT ATRIUM           Index       +---------------+-------++-----------++ LA diam:       3.90 cm1.83 cm/m  +---------------+-------++-----------++ LA Vol (A2C):  64.5 ml30.27 ml/m +---------------+-------++-----------++ LA Vol (A4C):  57.2 ml26.84 ml/m +---------------+-------++-----------++ LA Biplane Vol:63.4 ml29.75 ml/m +---------------+-------++-----------++ +------------+---------++-----------++ RIGHT ATRIUM         Index       +------------+---------++-----------++ RA Area:    13.50 cm            +------------+---------++-----------++ RA Volume:  31.70 ml 14.88 ml/m +------------+---------++-----------++  +------------------+------------++ +--------------+--------++ AORTIC VALVE                   PULMONIC VALVE         +------------------+------------++ +--------------+--------++ AV Area (Vmax):   0.63 cm     PV Vmax:      0.69 m/s +------------------+------------++ +--------------+--------++ AV Area (Vmean):  0.63 cm     PV Peak grad: 1.9 mmHg +------------------+------------++ +--------------+--------++ AV Area (VTI):    0.65 cm     +------------------+------------++ AV Vmax:          478.00 cm/s  +------------------+------------++ AV Vmean:         364.000 cm/s +------------------+------------++ AV VTI:           1.450 m      +------------------+------------++ AV Peak Grad:     91.4 mmHg    +------------------+------------++ AV Mean Grad:     59.0 mmHg    +------------------+------------++ LVOT Vmax:        72.40 cm/s   +------------------+------------++ LVOT Vmean:       54.800 cm/s  +------------------+------------++ LVOT VTI:         0.227 m       +------------------+------------++ LVOT/AV VTI ratio:0.16         +------------------+------------++ AR PHT:           948 msec     +------------------+------------++   +-------------+-------++ AORTA                +-------------+-------++ Ao Root diam:4.10 cm +-------------+-------++  +--------------+-----------++ +---------------+-----------++ MITRAL VALVE              TRICUSPID VALVE            +--------------+-----------++ +---------------+-----------++ MV Area (PHT):1.78 cm    TR Peak grad:  21.3 mmHg   +--------------+-----------++ +---------------+-----------++ MV PHT:       123.83 msec TR Vmax:       231.00 cm/s +--------------+-----------++ +---------------+-----------++ MV Decel Time:427 msec    +--------------+-----------++ +--------------+-------+ +-------------+-----------++  SHUNTS  MR Peak grad:92.5 mmHg    +--------------+-------+ +-------------+-----------++  Systemic VTI: 0.23 m  MR Vmax:     481.00 cm/s  +--------------+-------+ +-------------+-----------++  Systemic Diam:2.30 cm +--------------+----------++  +--------------+-------+ MV E velocity:67.90 cm/s +--------------+----------++ MV A velocity:79.10 cm/s +--------------+----------++ MV E/A ratio: 0.86       +--------------+----------++    Isaias Cowman MD Electronically signed by Isaias Cowman MD Signature Date/Time: 09/11/2018/11:03:57 AM      RIGHT/LEFT HEART CATH AND CORONARY ANGIOGRAPHY  Recommendations  Antiplatelet/Anticoag No indication for antiplatelet therapy at this time .  Indications  Aortic valve disease [I35.9 (ICD-10-CM)]  Procedural Details  Technical Details Cardiac Catheterization Procedure Note  Name: ALDRICH REDBURN MRN: [id number] DOB: January 17, 1946  Procedure: Right Heart Cath, Left Heart Cath, Selective Coronary Angiography, LV angiography  Indication: Aortic  stenosis. Pre TAVR   Procedural Details: The right groin was prepped, draped, and anesthetized with 1% lidocaine. Using the modified Seldinger technique a 5 French sheath was placed in the right femoral artery and a 7 French sheath was placed in the right femoral vein. A Swan-Ganz catheter was used for the right heart catheterization. Standard protocol was followed for recording of right heart pressures and sampling of oxygen saturations. Fick cardiac output was calculated. Standard Judkins catheters were used for selective coronary angiography . There were no immediate procedural complications. The patient was transferred to the post catheterization recovery area for further monitoring. Sedation time was 33 min. Sedation was achieved with 1 mg IV Verse, 225 mcg IV fentanyl. Contrast use was 80 cc, Radioation 535 mGy, 6.3 min fluoro  Procedural Findings: Hemodynamics RA 5/7/6 mmHg RV 30/7-11 mmHg PA 33/15-22 mmHg PCWP 12 mmHg LV not measured. AoV not crossed AO 141/70-97 mmHg  Oxygen saturations: PA 70.2 AO 99.5  Cardiac Output (Fick) 4.17 Cardiac Index (Fick) 2.01    Pulmonary vascular resistance (PVR): 2.4 Woods units.  Coronary angiography: Coronary dominance: right  Separate ostia  Left Anterior Descending (LAD):  normal 1st diagonal (D1):  normal 2nd diagonal (D2):  normal  Circumflex (LCx):  normal 1st obtuse marginal:  normal 2nd obtuse marginal:  normal   AV groove continuation segment: normal  Right Coronary Artery: normal Posterior descending artery: normal     Final Conclusions:   No significant cad  Recommendations:  Refer for consideration for TAVR  Teodoro Spray MD, [facility] 11/15/2018, 9:40 AM Estimated blood loss <50 mL.   During this procedure medications were administered to achieve and maintain moderate conscious sedation while the patient's heart rate, blood pressure, and oxygen saturation were continuously monitored and I was present face-to-face  100% of this time.  Medications (Filter: Administrations occurring from 11/15/18 0850 to 11/15/18 [id number]) Medication Rate/Dose/Volume Action  Date Time   Heparin (Porcine) in NaCl 1000-0.9 UT/500ML-% SOLN (mL) 500 mL Given 11/15/18 0856   Total dose as of 11/15/18 0952        500 mL        midazolam (VERSED) injection (mg) 1 mg Given 11/15/18 0858   Total dose as of 11/15/18 0952        1 mg        fentaNYL (SUBLIMAZE) injection (mcg) 25 mcg Given 11/15/18 0859   Total dose as of 11/15/18 0952        25 mcg        iohexol (OMNIPAQUE) 300 MG/ML solution (mL) 80 mL Given 11/15/18 0936   Total dose as of 11/15/18 0952        80 mL  Sedation Time  Sedation Time Physician-1: 32 minutes 54 seconds  Complications  Complications documented before study signed (11/15/2018 [id number] AM)   No complications were associated with this study.  Documented by Teodoro Spray, MD - 11/15/2018 9:43 AM    Coronary Findings  Diagnostic Dominance: Right Left Main  Separate ostia of lad and lcx  Left Anterior Descending  Vessel was injected. Vessel is normal in caliber. Vessel is angiographically normal.  Left Circumflex  Vessel was injected. Vessel is normal in caliber. Vessel is angiographically normal.  Right Coronary Artery  Vessel was injected. Vessel is normal in caliber. Vessel is angiographically normal.  Intervention  No interventions have been documented. Right Heart  Right Atrium Right atrial pressure is normal.  Right Ventricle The right ventricular size is normal. The systolic function is normal.  Coronary Diagrams  Diagnostic Dominance: Right  Intervention  Implants   Vascular Products  Device Closure Mynxgrip 62f [id number] - Implanted  Inventory item: DEVICE CLOSURE MYNXGRIP 43F Model/Cat number: [id number]  Manufacturer: [employer] Lot number: [id number]  Device identifier: [id number] Device identifier type: GS1  Area Of Implantation: Groin    GUDID  Information  Request status Successful    Brand name: [facility] Version/Model: [id number]  Company name: [employer] MRI safety info as of 11/15/18: Labeling does not contain MRI Safety Information  Contains dry or latex rubber: No    GMDN P.T. name: Wound hydrogel dressing, non-antimicrobial    As of 11/15/2018  Status: Implanted      Syngo Images  Show images for CARDIAC CATHETERIZATION  Images on Long Term Storage  Show images for Thayer, Yazdani to Procedure Log  Procedure Log    Hemo Data (last day) before discharge   AO Systolic Cath Pressure  AO Diastolic Cath Pressure  AO Mean Cath Pressure  PA Systolic Cath Pressure  PA Diastolic Cath Pressure  PA Mean Cath Pressure  RA Wedge A Wave  RA Wedge V Wave  RV Systolic Cath Pressure  RV Diastolic Cath Pressure  RV End Diastolic  PCW A Wave  PCW V Wave  PCW Mean  AO O2 Sat  PA O2 Sat  AO O2 Sat  Fick C.O.  Fick C.I.   -  -  -  -  -  -  7 mmHg  6 mmHg  -  -  -  13 mmHg  14 mmHg  12 mmHg  -  -  -  4.17 L/min  2.01 L/min/m2   153  71 mmHg  100 mmHg  -  -  -  -  -  -  -  -  -  -  -  -  -  -  -  -   -  -  -  39 mmHg  16 mmHg  24 mmHg  -  -  -  -  -  -  -  -  -  -  -  -  -   -  -  -  33 mmHg  15 mmHg  22 mmHg  -  -  -  -  -  -  -  -  -  -  -  -  -   -  -  -  -  -  -  -  -  30 mmHg  7 mmHg  11 mmHg  -  -  -  -  -  -  -  -  141  70 mmHg  97 mmHg  -  -  -  -  -  -  -  -  -  -  -  -  -  -  -  -   -  -  -  -  -  -  -  -  -  -  -  -  -  -  99.5 %  -  SA  -  -   -  -  -  -  -  -  -  -  -  -  -  -                    EKG: NSR/sinus bradycardia w/out significant AV conduction delay    STS Risk Calculator  Procedure: Isolated AVR Risk of Mortality: 0.893% Renal Failure: 0.933% Permanent Stroke: 1.010% Prolonged Ventilation: 3.584% DSW Infection: 0.099% Reoperation: 3.727% Morbidity or Mortality: 6.678% Short Length of Stay: 58.842% Long Length of Stay: 2.528%     Impression:  Patient has stage D severe  symptomatic aortic stenosis.  He describes stable symptoms of exertional shortness of breath and chest tightness consistent with chronic diastolic congestive heart failure, New York Heart Association functional class II.  More importantly, he recently suffered a syncopal episode following physical activity.  I have personally reviewed the patient's recent transthoracic echocardiogram and diagnostic cardiac catheterization.  Echocardiogram reveals severe aortic stenosis.  There is severe thickening, calcification, and restricted leaflet mobility involving all 3 leaflets and 2 of the 3 leaflets may be fused or functionally bicuspid (Sievers type I).  Peak velocity across the aortic valve measured close to 4.8 m/s corresponding to mean transvalvular gradient estimated 59 mmHg and aortic valve area calculated only 0.63 cm.  Left ventricular systolic function remains normal.  Diagnostic cardiac catheterization is notable for the absence of significant coronary artery disease.  I agree the patient needs aortic valve replacement in the near future.  Risks associated with conventional surgery would be reasonably low but the patient may also be a good candidate for transcatheter aortic valve replacement.   Plan:  The patient and his niece were counseled at length regarding treatment alternatives for management of severe symptomatic aortic stenosis. Alternative approaches such as conventional aortic valve replacement, transcatheter aortic valve replacement, and continued medical therapy without intervention were compared and contrasted at length.  The risks associated with conventional surgical aortic valve replacement were discussed in detail, as were expectations for post-operative convalescence.  Issues specific to transcatheter aortic valve replacement were discussed including questions about long term valve durability, the potential for paravalvular leak, possible increased risk of need for permanent pacemaker  placement, and other technical complications related to the procedure itself.  Long-term prognosis with medical therapy was discussed. This discussion was placed in the context of the patient's own specific clinical presentation and past medical history.  All of their questions have been addressed.  The patient is very interested in proceeding with transcatheter aortic valve replacement in the near future as an alternative to conventional surgery.  At the next that the patient will undergo CT angiography to evaluate the feasibility of transcatheter aortic valve replacement.  His case will be reviewed by a multidisciplinary team of specialists.  Presuming that he remains a good candidate, we tentatively plan to proceed with transcatheter aortic valve replacement on December 06, 2018.  Following the decision to proceed with transcatheter aortic valve replacement, a discussion has been held regarding what types  of management strategies would be attempted intraoperatively in the event of life-threatening complications, including whether or not the patient would be considered a candidate for the use of cardiopulmonary bypass and/or conversion to open sternotomy for attempted surgical intervention.  The patient has been advised of a variety of complications that might develop including but not limited to risks of death, stroke, paravalvular leak, aortic dissection or other major vascular complications, aortic annulus rupture, device embolization, cardiac rupture or perforation, mitral regurgitation, acute myocardial infarction, arrhythmia, heart block or bradycardia requiring permanent pacemaker placement, congestive heart failure, respiratory failure, renal failure, pneumonia, infection, other late complications related to structural valve deterioration or migration, or other complications that might ultimately cause a temporary or permanent loss of functional independence or other long term morbidity.  The patient  provides full informed consent for the procedure as described and all questions were answered   I spent in excess of 90 minutes during the conduct of this office consultation and >50% of this time involved direct face-to-face encounter with the patient for counseling and/or coordination of their care.   Valentina Gu. Roxy Manns, MD 11/17/2018 1:57 PM

## 2018-11-18 ENCOUNTER — Encounter: Payer: Self-pay | Admitting: Thoracic Surgery (Cardiothoracic Vascular Surgery)

## 2018-11-18 ENCOUNTER — Other Ambulatory Visit: Payer: Self-pay

## 2018-11-18 DIAGNOSIS — R55 Syncope and collapse: Secondary | ICD-10-CM

## 2018-11-18 DIAGNOSIS — I350 Nonrheumatic aortic (valve) stenosis: Secondary | ICD-10-CM

## 2018-11-23 ENCOUNTER — Ambulatory Visit (HOSPITAL_COMMUNITY): Payer: Medicare HMO

## 2018-11-26 ENCOUNTER — Encounter: Payer: Self-pay | Admitting: Physician Assistant

## 2018-11-29 ENCOUNTER — Other Ambulatory Visit: Payer: Self-pay

## 2018-11-29 DIAGNOSIS — I350 Nonrheumatic aortic (valve) stenosis: Secondary | ICD-10-CM

## 2018-11-30 ENCOUNTER — Ambulatory Visit (HOSPITAL_COMMUNITY)
Admission: RE | Admit: 2018-11-30 | Discharge: 2018-11-30 | Disposition: A | Discharge status: Home / Self Care | Payer: Medicare HMO | Source: Ambulatory Visit | Attending: Thoracic Surgery (Cardiothoracic Vascular Surgery) | Admitting: Thoracic Surgery (Cardiothoracic Vascular Surgery)

## 2018-11-30 ENCOUNTER — Ambulatory Visit (HOSPITAL_BASED_OUTPATIENT_CLINIC_OR_DEPARTMENT_OTHER)
Admission: RE | Admit: 2018-11-30 | Discharge: 2018-11-30 | Disposition: A | Discharge status: Home / Self Care | Payer: Medicare HMO | Source: Ambulatory Visit | Attending: Thoracic Surgery (Cardiothoracic Vascular Surgery) | Admitting: Thoracic Surgery (Cardiothoracic Vascular Surgery)

## 2018-11-30 ENCOUNTER — Ambulatory Visit (HOSPITAL_COMMUNITY): Payer: Medicare HMO

## 2018-11-30 ENCOUNTER — Other Ambulatory Visit: Payer: Self-pay

## 2018-11-30 DIAGNOSIS — R55 Syncope and collapse: Secondary | ICD-10-CM

## 2018-11-30 DIAGNOSIS — I350 Nonrheumatic aortic (valve) stenosis: Secondary | ICD-10-CM | POA: Diagnosis not present

## 2018-11-30 MED ORDER — IOHEXOL 350 MG/ML SOLN
80.0000 mL | Freq: Once | INTRAVENOUS | Status: AC | PRN
  Administered 2018-11-30: 10:00:00 80 mL via INTRAVENOUS

## 2018-11-30 NOTE — Progress Notes (Signed)
Bilateral carotid duplex completed. Preliminary results in Chart review CV proc. Leland Staszewski,RVS 11/30/2018,m 10.31 AM

## 2018-12-01 ENCOUNTER — Encounter: Payer: Self-pay | Admitting: Thoracic Surgery (Cardiothoracic Vascular Surgery)

## 2018-12-01 ENCOUNTER — Telehealth: Payer: Self-pay | Admitting: Thoracic Surgery (Cardiothoracic Vascular Surgery)

## 2018-12-01 NOTE — Progress Notes (Signed)
[facility] [address] ([address]), [place] [address] [place] ([place]) [place] [address] Phone: [phone] Fax: [phone]      Your procedure is scheduled on Tuesday, 12/06/2018.  Report to [facility] Main Entrance "A" at 08:30 A.M., and check in at the Admitting office.  Call this number if you have problems the morning of surgery:  [phone]  Call [phone] if you have any questions prior to your surgery date Monday-Friday 8am-4pm    Remember:  Do not eat or drink after midnight the night before your surgery     Take these medicines the morning of surgery with A SIP OF WATER: NONE    The Morning of Surgery  Do not wear jewelry, make-up or nail polish.  Do not wear lotions, powders, or perfumes/colognes, or deodorant  Men may shave face and neck.  Do not bring valuables to the hospital.  [facility] is not responsible for any belongings or valuables.  IF you are a smoker, DO NOT Smoke 24 hours prior to surgery  IF you wear a CPAP at night please bring your mask, tubing, and machine the morning of surgery   Remember that you must have someone to transport you home after your surgery, and remain with you for 24 hours if you are discharged the same day.   Contacts, eyeglasses, hearing aids, dentures or bridgework may not be worn into surgery.    Leave your suitcase in the car.  After surgery it may be brought to your room.  For patients admitted to the hospital, discharge time will be determined by your treatment team.  Patients discharged the day of surgery will not be allowed to drive home.    Special instructions:   [facility]- Preparing For Surgery  Before surgery, you can play an important role. Because skin is not sterile, your skin needs to be as free of germs as possible. You can reduce the number of germs on your skin by washing with CHG (chlorahexidine gluconate) Soap before surgery.  CHG is an antiseptic  cleaner which kills germs and bonds with the skin to continue killing germs even after washing.    Oral Hygiene is also important to reduce your risk of infection.  Remember - BRUSH YOUR TEETH THE MORNING OF SURGERY WITH YOUR REGULAR TOOTHPASTE  Please do not use if you have an allergy to CHG or antibacterial soaps. If your skin becomes reddened/irritated stop using the CHG.  Do not shave (including legs and underarms) for at least 48 hours prior to first CHG shower. It is OK to shave your face.  Please follow these instructions carefully.   1. Shower the NIGHT BEFORE SURGERY and the MORNING OF SURGERY with CHG Soap.   2. If you chose to wash your hair, wash your hair first as usual with your normal shampoo.  3. After you shampoo, rinse your hair and body thoroughly to remove the shampoo.  4. Use CHG as you would any other liquid soap. You can apply CHG directly to the skin and wash gently with a scrungie or a clean washcloth.   5. Apply the CHG Soap to your body ONLY FROM THE NECK DOWN.  Do not use on open wounds or open sores. Avoid contact with your eyes, ears, mouth and genitals (private parts). Wash Face and genitals (private parts)  with your normal soap.   6. Wash thoroughly, paying special attention to the area where your surgery will be  performed.  7. Thoroughly rinse your body with warm water from the neck down.  8. DO NOT shower/wash with your normal soap after using and rinsing off the CHG Soap.  9. Pat yourself dry with a CLEAN TOWEL.  10. Wear CLEAN PAJAMAS to bed the night before surgery, wear comfortable clothes the morning of surgery  11. Place CLEAN SHEETS on your bed the night of your first shower and DO NOT SLEEP WITH PETS.    Day of Surgery:   Please shower the morning of surgery with the CHG soap  Do not apply any deodorants/lotions. Please wear clean clothes to the hospital/surgery center.   Remember to brush your teeth WITH YOUR REGULAR  TOOTHPASTE.   Please read over the following fact sheets that you were given.

## 2018-12-01 NOTE — Telephone Encounter (Signed)
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  Although the valve is functionally bicuspid with extensive calcification, there is essentially no calcification in the aortic annulus.  Despite the elliptical shape I expect the patient will be at low risk for paravalvular leak following transcatheter aortic valve replacement.  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  The patient would be at relatively low risk for conventional surgical aortic valve replacement and replacement of the proximal ascending thoracic aorta could be performed easily at that time.  Alternatively, the patient will likely do well with transcatheter aortic valve replacement but will need intermittent long-term surveillance for the dilated aorta due to low but significant risk of late aneurysmal enlargement.  Because of the patient's advanced age he prefers to proceed with transcatheter aortic valve replacement which has been scheduled for next week.  All of his questions have been answered.  Rexene Alberts, MD 12/01/2018 2:39 PM

## 2018-12-02 ENCOUNTER — Ambulatory Visit (HOSPITAL_COMMUNITY)
Admission: RE | Admit: 2018-12-02 | Discharge: 2018-12-02 | Disposition: A | Discharge status: Home / Self Care | Payer: Medicare HMO | Source: Ambulatory Visit | Attending: Cardiovascular Disease | Admitting: Cardiovascular Disease

## 2018-12-02 ENCOUNTER — Encounter (HOSPITAL_COMMUNITY)
Admission: RE | Admit: 2018-12-02 | Discharge: 2018-12-02 | Disposition: A | Discharge status: Home / Self Care | Payer: Medicare HMO | Source: Ambulatory Visit | Attending: Thoracic Surgery (Cardiothoracic Vascular Surgery) | Admitting: Thoracic Surgery (Cardiothoracic Vascular Surgery)

## 2018-12-02 ENCOUNTER — Other Ambulatory Visit: Payer: Self-pay

## 2018-12-02 ENCOUNTER — Encounter: Payer: Self-pay | Admitting: Physical Therapy

## 2018-12-02 ENCOUNTER — Encounter (HOSPITAL_COMMUNITY): Payer: Self-pay

## 2018-12-02 ENCOUNTER — Other Ambulatory Visit: Payer: Medicare HMO

## 2018-12-02 ENCOUNTER — Ambulatory Visit: Payer: Medicare HMO | Attending: Thoracic Surgery (Cardiothoracic Vascular Surgery) | Admitting: Physical Therapy

## 2018-12-02 ENCOUNTER — Other Ambulatory Visit (HOSPITAL_COMMUNITY)
Admission: RE | Admit: 2018-12-02 | Discharge: 2018-12-02 | Disposition: A | Discharge status: Home / Self Care | Payer: Medicare HMO | Source: Ambulatory Visit | Attending: Thoracic Surgery (Cardiothoracic Vascular Surgery) | Admitting: Thoracic Surgery (Cardiothoracic Vascular Surgery)

## 2018-12-02 DIAGNOSIS — R001 Bradycardia, unspecified: Secondary | ICD-10-CM | POA: Insufficient documentation

## 2018-12-02 DIAGNOSIS — Z01818 Encounter for other preprocedural examination: Secondary | ICD-10-CM | POA: Insufficient documentation

## 2018-12-02 DIAGNOSIS — I350 Nonrheumatic aortic (valve) stenosis: Secondary | ICD-10-CM

## 2018-12-02 DIAGNOSIS — R2689 Other abnormalities of gait and mobility: Secondary | ICD-10-CM | POA: Diagnosis present

## 2018-12-02 DIAGNOSIS — Z20828 Contact with and (suspected) exposure to other viral communicable diseases: Secondary | ICD-10-CM | POA: Insufficient documentation

## 2018-12-02 DIAGNOSIS — R9431 Abnormal electrocardiogram [ECG] [EKG]: Secondary | ICD-10-CM | POA: Insufficient documentation

## 2018-12-02 HISTORY — DX: Thoracic aortic aneurysm, without rupture: I71.2

## 2018-12-02 HISTORY — DX: Aneurysm of the ascending aorta, without rupture: I71.21

## 2018-12-02 LAB — TYPE AND SCREEN
ABO/RH(D): O POS
Antibody Screen: NEGATIVE

## 2018-12-02 LAB — URINALYSIS, ROUTINE W REFLEX MICROSCOPIC
Bilirubin Urine: NEGATIVE
Glucose, UA: NEGATIVE mg/dL
Ketones, ur: NEGATIVE mg/dL
Leukocytes,Ua: NEGATIVE
Nitrite: NEGATIVE
Protein, ur: NEGATIVE mg/dL
Specific Gravity, Urine: 1.015 (ref 1.005–1.030)
pH: 5 (ref 5.0–8.0)

## 2018-12-02 LAB — COMPREHENSIVE METABOLIC PANEL
ALT: 12 U/L (ref 0–44)
AST: 18 U/L (ref 15–41)
Albumin: 3.8 g/dL (ref 3.5–5.0)
Alkaline Phosphatase: 59 U/L (ref 38–126)
Anion gap: 8 (ref 5–15)
BUN: 14 mg/dL (ref 8–23)
CO2: 24 mmol/L (ref 22–32)
Calcium: 9 mg/dL (ref 8.9–10.3)
Chloride: 108 mmol/L (ref 98–111)
Creatinine, Ser: 1.05 mg/dL (ref 0.61–1.24)
GFR calc Af Amer: >60 mL/min (ref >60)
GFR calc non Af Amer: >60 mL/min (ref >60)
Glucose, Bld: 91 mg/dL (ref 70–99)
Potassium: 4 mmol/L (ref 3.5–5.1)
Sodium: 140 mmol/L (ref 135–145)
Total Bilirubin: 0.6 mg/dL (ref 0.3–1.2)
Total Protein: 6.9 g/dL (ref 6.5–8.1)

## 2018-12-02 LAB — SURGICAL PCR SCREEN
MRSA, PCR: NEGATIVE
Staphylococcus aureus: NEGATIVE

## 2018-12-02 LAB — APTT: aPTT: 30 seconds (ref 24–36)

## 2018-12-02 LAB — ABO/RH: ABO/RH(D): O POS

## 2018-12-02 LAB — CBC
HCT: 41.1 % (ref 39.0–52.0)
Hemoglobin: 14 g/dL (ref 13.0–17.0)
MCH: 32.6 pg (ref 26.0–34.0)
MCHC: 34.1 g/dL (ref 30.0–36.0)
MCV: 95.6 fL (ref 80.0–100.0)
Platelets: 185 10*3/uL (ref 150–400)
RBC: 4.3 MIL/uL (ref 4.22–5.81)
RDW: 12.5 % (ref 11.5–15.5)
WBC: 5.3 10*3/uL (ref 4.0–10.5)
nRBC: 0 % (ref 0.0–0.2)

## 2018-12-02 LAB — PROTIME-INR
INR: 1 (ref 0.8–1.2)
Prothrombin Time: 12.6 seconds (ref 11.4–15.2)

## 2018-12-02 LAB — BLOOD GAS, ARTERIAL
Acid-Base Excess: 1.7 mmol/L (ref 0.0–2.0)
Bicarbonate: 26 mmol/L (ref 20.0–28.0)
Drawn by: 42180
FIO2: 21
O2 Saturation: 97.6 %
Patient temperature: 98.6
pCO2 arterial: 42.3 mmHg (ref 32.0–48.0)
pH, Arterial: 7.405 (ref 7.350–7.450)
pO2, Arterial: 98.2 mmHg (ref 83.0–108.0)

## 2018-12-02 LAB — BRAIN NATRIURETIC PEPTIDE: B Natriuretic Peptide: 202.1 pg/mL — ABNORMAL HIGH (ref 0.0–100.0)

## 2018-12-02 NOTE — Therapy (Signed)
[place], [place], [address] Phone: [phone]   Fax:  [phone]  Physical Therapy Evaluation  Patient Details  Name: Gregory Black MRN: [id number] Date of Birth: 1945-11-10 Referring Provider (PT): Dr Darylene Price    Encounter Date: 12/02/2018  PT End of Session - 12/02/18 1014    Visit Number  1    Number of Visits  1    Date for PT Re-Evaluation  12/02/18    PT Start Time  0930    PT Stop Time  1008    PT Time Calculation (min)  38 min    Activity Tolerance  Patient tolerated treatment well    Behavior During Therapy  [facility] for tasks assessed/performed       Past Medical History:  Diagnosis Date  . Arthritis    hands, knees  . Bulging lumbar disc   . Cancer [facility]) 2019   prostate  . Complication of anesthesia    during start of colonoscopy after medication given heart rate dropped so procedure was stopped  . History of concussion   . History of hiatal hernia   . Neuropathy   . Reflux    in past  . Severe aortic stenosis   . Weakness of both legs    pt says from Vit b12 deficiency  . Wears dentures    full upper and lower    Past Surgical History:  Procedure Laterality Date  . CATARACT EXTRACTION Left 2015  . COLONOSCOPY    . COLONOSCOPY WITH PROPOFOL N/A 05/17/2015   Procedure: COLONOSCOPY WITH PROPOFOL;  Surgeon: Lucilla Lame, MD;  Location: [facility];  Service: Endoscopy;  Laterality: N/A;  . EYE SURGERY    . KNEE ARTHROSCOPY W/ MENISCAL REPAIR Right   . RADIOACTIVE SEED IMPLANT N/A 07/26/2017   Procedure: RADIOACTIVE SEED IMPLANT/BRACHYTHERAPY IMPLANT;  Surgeon: Hollice Espy, MD;  Location: [facility] OR[facility];  Service: Urology;  Laterality: N/A;  . RIGHT/LEFT HEART CATH AND CORONARY ANGIOGRAPHY Bilateral 11/15/2018   Procedure: RIGHT/LEFT HEART CATH AND CORONARY ANGIOGRAPHY;  Surgeon: Teodoro Spray, MD;  Location: [facility] CV LAB;  Service: Cardiovascular;  Laterality:  Bilateral;  . TONSILLECTOMY    . TONSILLECTOMY AND ADENOIDECTOMY      There were no vitals filed for this visit.   Subjective Assessment - 12/02/18 0937    Subjective  Abpout 6 weeks ago the patient passed out. At the hospital it was found that he had severe aortic stenosis. He has noticed when he is running he has had shortness of breath. He also has a history of significant knee and ankle pain. He also has shortness of breath when he is breathing.    Currently in Pain?  No/denies   Knee pain when he is walking        [facility] PT Assessment - 12/02/18 0001      Assessment   Medical Diagnosis  Severe aortic Stenosis     Referring Provider (PT)  Dr Darylene Price     Next MD Visit  Nothing scheduled     Prior Therapy  None       Precautions   Precautions  None      Restrictions   Weight Bearing Restrictions  No      Balance Screen   Has the patient fallen in the past 6 months  No    Has the patient had a decrease in activity level because of a fear of falling?  No    Is the patient reluctant to leave their home because of a fear of falling?   No      Prior Function   Level of Independence  Independent    Leisure  cant lift and run like he used to.       Cognition   Overall Cognitive Status  Within Functional Limits for tasks assessed    Attention  Focused    Focused Attention  Appears intact    Memory  Appears intact    Awareness  Appears intact    Problem Solving  Appears intact      Observation/Other Assessments   Focus on Therapeutic Outcomes (FOTO)   1x visit       Sensation   Light Touch  Not tested    Additional Comments  Numbness in his left foot at times       Coordination   Gross Motor Movements are Fluid and Coordinated  Yes    Fine Motor Movements are Fluid and Coordinated  Yes      ROM / Strength   AROM / PROM / Strength  AROM;PROM;Strength      AROM   Overall AROM Comments  full active ROM of the UE and LE       Strength   Strength Assessment  Site  Hand    Right/Left hand  Right;Left    Right Hand Grip (lbs)  55    Left Hand Grip (lbs)  60       [facility] Pre-Surgical Assessment - 12/02/18 0001    5 Meter Walk Test- trial 1  8 sec    5 Meter Walk Test- trial 2  8 sec.     5 Meter Walk Test- trial 3  8 sec.    5 meter walk test average  8 sec    4 Stage Balance Test tolerated for:   10 sec.    4 Stage Balance Test Position  4    Sit To Stand Test- trial 1  5 sec.    Comment  11 sec     ADL/IADL Independent with:  Bathing;Dressing;Meal prep;Yard work;Finances    6 Minute Walk- Baseline  yes    BP (mmHg)  137/86    HR (bpm)  71    02 Sat (%RA)  99 %    Modified Borg Scale for Dyspnea  0- Nothing at all    Perceived Rate of Exertion (Borg)  6-    6 Minute Walk Post Test  yes    BP (mmHg)  146/87    HR (bpm)  85    02 Sat (%RA)  98 %    Modified Borg Scale for Dyspnea  3- Moderate shortness of breath or breathing difficulty    Perceived Rate of Exertion (Borg)  7- Very, very light    Aerobic Endurance Distance Walked  1110    Endurance additional comments  36% limitation               Objective measurements completed on examination: See above findings.                           Plan - 12/02/18 1015    Clinical Impression Statement  See below    Stability/Clinical Decision Making  Stable/Uncomplicated    Clinical Decision Making  Low    Rehab Potential  Good    PT Frequency  One time visit  Consulted and Agree with Plan of Care  Patient       Patient will benefit from skilled therapeutic intervention in order to improve the following deficits and impairments:  Abnormal gait, Decreased endurance, Difficulty walking  Visit Diagnosis: Other abnormalities of gait and mobility   Clinical Impression Statement: Pt is a 73 yo male presenting to OP PT for evaluation prior to possible TAVR surgery due to severe aortic stenosis. Pt reports onset of dyspnea with exertion and bending over  approximately 6 weeks ago. Symptoms are limiting ability to bend over and pick things up and to ambulate. Pt presents with normal ROM and strength, normal balance and is not at high fall risk 4 stage balance test, decreased walking speed and decreased aerobic endurance per 6 minute walk test. Pt ambulated 1110 feet in 6 . At time of rest, patient's HR was 85 bpm and O2 was 98 on room air. Pt reported 3/10 shortness of breath on modified scale for dyspnea. B/P increased significantly with 6 minute walk test. Based on the Short Physical Performance Battery, patient has a frailty rating of 11/12 with </= 5/12 considered frail.    Problem List Patient Active Problem List   Diagnosis Date Noted  . Chest pain 09/10/2018  . Compression fracture of L3 lumbar vertebra 04/20/2015  . Bilateral renal cysts 04/20/2015  . Cardiomegaly 04/20/2015  . Hepatic steatosis 04/20/2015  . L4-L5 disc bulge 04/20/2015  . Coronary artery disease 04/20/2015  . Ectatic abdominal aorta ([place]) 04/20/2015  . Preventative health care 04/09/2015  . Screening for AAA (abdominal aortic aneurysm) 04/08/2015  . Elevated PSA 04/06/2015  . Elevated LDL cholesterol level 04/06/2015  . Colon cancer screening 04/03/2015  . Neuropathy   . Aortic stenosis   . Cardiac murmur     Carney Living PT DPT  12/02/2018, 10:19 AM  [facility] [address] [place], [place], [address] Phone: [phone]   Fax:  [phone]  Name: Gregory Black MRN: [id number] Date of Birth: 14-Nov-1945

## 2018-12-02 NOTE — Progress Notes (Signed)
PCP -  Frazier Richards, MD Cardiologist - Bartholome Bill, MD CVTS - Halford Chessman, MD  Chest x-ray - 12/02/2018 EKG - 12/02/2018 Stress Test - denies ECHO - 09/11/2018 Cardiac Cath - 11/15/2018  Sleep Study - denies CPAP - NA  Blood Thinner Instructions: N/A Aspirin Instructions: Per MD, patient LD 11/28/18  Anesthesia review: Yes; cardiac hx; prior anesthesia complications  Patient denies shortness of breath, fever, cough and chest pain at PAT appointment  COVID testing scheduled today; patient states verbal understanding of quarantine guidelines post testing  Coronavirus Screening  Have you experienced the following symptoms:  Cough yes/no: No Fever (>100.54F)  yes/no: No Runny nose yes/no: No Sore throat yes/no: No Difficulty breathing/shortness of breath  yes/no: No  Have you or a family member traveled in the last 14 days and where? yes/no: No   If the patient indicates "YES" to the above questions, their PAT will be rescheduled to limit the exposure to others and, the surgeon will be notified. THE PATIENT WILL NEED TO BE ASYMPTOMATIC FOR 14 DAYS.   If the patient is not experiencing any of these symptoms, the PAT nurse will instruct them to NOT bring anyone with them to their appointment since they may have these symptoms or traveled as well.   Please remind your patients and families that hospital visitation restrictions are in effect and the importance of the restrictions.     Patient verbalized understanding of instructions that were given to them at the PAT appointment. Patient was also instructed that they will need to review over the PAT instructions again at home before surgery.

## 2018-12-03 LAB — HEMOGLOBIN A1C
Hgb A1c MFr Bld: 5.2 % (ref 4.8–5.6)
Mean Plasma Glucose: 103 mg/dL

## 2018-12-04 LAB — NOVEL CORONAVIRUS, NAA (HOSP ORDER, SEND-OUT TO REF LAB; TAT 18-24 HRS): SARS-CoV-2, NAA: NOT DETECTED

## 2018-12-05 ENCOUNTER — Encounter (HOSPITAL_COMMUNITY): Payer: Self-pay

## 2018-12-05 MED ORDER — POTASSIUM CHLORIDE 2 MEQ/ML IV SOLN
80.0000 meq | INTRAVENOUS | Status: DC
  Filled 2018-12-05: qty 40

## 2018-12-05 MED ORDER — VANCOMYCIN HCL 10 G IV SOLR
1500.0000 mg | INTRAVENOUS | Status: AC
  Administered 2018-12-06: 1500 mg via INTRAVENOUS
  Filled 2018-12-05: qty 1500

## 2018-12-05 MED ORDER — SODIUM CHLORIDE 0.9 % IV SOLN
1.5000 g | INTRAVENOUS | Status: AC
  Administered 2018-12-06: 1.5 g via INTRAVENOUS
  Filled 2018-12-05: qty 1.5

## 2018-12-05 MED ORDER — MAGNESIUM SULFATE 50 % IJ SOLN
40.0000 meq | INTRAMUSCULAR | Status: DC
  Filled 2018-12-05: qty 9.85

## 2018-12-05 MED ORDER — SODIUM CHLORIDE 0.9 % IV SOLN
INTRAVENOUS | Status: DC
  Filled 2018-12-05: qty 30

## 2018-12-05 MED ORDER — NOREPINEPHRINE 4 MG/250ML-% IV SOLN
0.0000 ug/min | INTRAVENOUS | Status: DC
  Filled 2018-12-05: qty 250

## 2018-12-05 MED ORDER — DEXMEDETOMIDINE HCL IN NACL 400 MCG/100ML IV SOLN
0.1000 ug/kg/h | INTRAVENOUS | Status: AC
  Administered 2018-12-06: 6 ug/kg/h via INTRAVENOUS
  Filled 2018-12-05: qty 100

## 2018-12-05 NOTE — Progress Notes (Signed)
Anesthesia Chart Review:  Case: [id number] Date/Time: 12/06/18 1030   Procedures:      TRANSCATHETER AORTIC VALVE REPLACEMENT, TRANSFEMORAL (N/A Chest)     TRANSESOPHAGEAL ECHOCARDIOGRAM (TEE) (N/A )   Anesthesia type: General   Pre-op diagnosis: Severe Aortic Stenosis   Location: [facility] OR ROOM 16 / [place] [facility]   Surgeon: Sherren Mocha, MD    CT Surgeon: Darylene Price, MD   DISCUSSION: Patient is a 73 year old male scheduled for the above procedure.  History includes former smoker (quit 2000), murmur/severe AS, dysrhythmia (asymptomatic bradycardia; palpitations), CAD, ascending TAA (4.4 cm 11/30/18 CTA; yearly surveillance recommended), prostate cancer (s/p radioactive seed implant/brachytherapy 07/26/17), hiatal hernia.  He reported that during a prior colonoscopy in 2017, he developed significant bradycardia and the procedure was aborted. It was at this point that he was referred to cardiologist Dr. Ubaldo Glassing. No pacemaker recommended at that time following testing including Holter monitor.   12/02/18 COVID-19 test negative. Anesthesia team to evaluate on the day of surgery.    VS: BP 138/70   Pulse (!) 52   Temp 36.7 C (Temporal)   Resp 18   Ht 5\' 10"  (1.778 m)   Wt 90.9 kg   SpO2 100%   BMI 28.77 kg/m    PROVIDERS: Kirk Ruths, MD is PCP  Bartholome Bill, MD is cardiologist [facility] [place])  LABS: Labs reviewed: Acceptable for surgery. (all labs ordered are listed, but only abnormal results are displayed)  Labs Reviewed  BRAIN NATRIURETIC PEPTIDE - Abnormal; Notable for the following components:      Result Value   B Natriuretic Peptide 202.1 (*)    All other components within normal limits  URINALYSIS, ROUTINE W REFLEX MICROSCOPIC - Abnormal; Notable for the following components:   Hgb urine dipstick MODERATE (*)    Bacteria, UA RARE (*)    All other components within normal limits  SURGICAL PCR SCREEN  APTT  BLOOD GAS, ARTERIAL  CBC   COMPREHENSIVE METABOLIC PANEL  HEMOGLOBIN A1C  PROTIME-INR  TYPE AND SCREEN  ABO/RH     IMAGES: Preoperative chest x-ray reviewed.  CTA chest/abd/pelvis 11/30/18: IMPRESSION: 1. Vascular findings and measurements pertinent to potential TAVR procedure, as detailed above. 2. Severe thickening calcification of the aortic valve, compatible with the reported clinical history of severe aortic stenosis. 3. Aortic atherosclerosis, in addition to left main and 3 vessel coronary artery disease. In addition, there is ectasia of ascending thoracic aorta (4.4 cm in diameter). Recommend annual imaging followup by CTA or MRA. This recommendation follows 2010 ACCF/AHA/AATS/ACR/ASA/SCA/SCAI/SIR/STS/SVM Guidelines for the Diagnosis and Management of Patients with Thoracic Aortic Disease. Circulation. 2010; [id number]. Aortic aneurysm NOS (ICD10-I71.9). 4. Additional incidental findings, as above. [See full report]   EKG: Preoperative EKG reviewed.   CV: Carotid US 11/30/18: Summary: - Right Carotid: Velocities in the right ICA are consistent with a 1-39% stenosis. - Left Carotid: Velocities in the left ICA are consistent with a 1-39% stenosis. - Vertebrals:  Bilateral vertebral arteries demonstrate antegrade flow. - Subclavians: Normal flow hemodynamics were seen in bilateral subclavian              arteries.   CT coronary 11/30/18: IMPRESSION: 1. 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 Annular area in 30% phase 522-526 mm2 suitable for a 26 mm Sapien 3 valve. STJ and sinuses suggest 29 mm valve may be possible but annulus is elliptical with small minor axis and area falls into the 26 mm range 2.  Moderate aortic root enlargement 4.4 cm with shift to right lung space 3.  Coronary arteries sufficient height for deployment 4.  Optimum angle for deployment AP   RHC/LHC 11/15/18: - Hemodynamics RA 5/7/6 mmHg RV 30/7-11 mmHg PA 33/15-22 mmHg PCWP 12 mmHg LV not  measured. AoV not crossed AO 141/70-97 mmHg Oxygen saturations: PA 70.2 AO 99.5 Cardiac Output (Fick) 4.17 Cardiac Index (Fick) 2.01  Pulmonary vascular resistance (PVR): 2.4 Woods units. - Coronary Angiography  Right dominant. Separate ostia of LAD and LCX. No significant CAD. - Recommendations: Refer for consideration for TAVR.   Echo 09/11/18: IMPRESSIONS  1. The left ventricle has normal systolic function with an ejection fraction of 60-65%. The cavity size was normal. There is mildly increased left ventricular wall thickness. Left ventricular diastolic Doppler parameters are indeterminate.  2. The right ventricle has normal systolic function. The cavity was normal. There is no increase in right ventricular wall thickness.  3. Aortic valve regurgitation was not assessed by color flow Doppler. Severe stenosis of the aortic valve. AV Area (Vmax):   0.63 cm     +------------------+------------++  AV Area (Vmean):  0.63 cm      +------------------+------------++  AV Area (VTI):    0.65 cm     +------------------+------------++ AV Vmax:          478.00 cm/s  +------------------+------------++ AV Vmean:         364.000 cm/s +------------------+------------++ AV VTI:           1.450 m      +------------------+------------++ AV Peak Grad:     91.4 mmHg    +------------------+------------++ AV Mean Grad:     59.0 mmHg     48 Hour Holter Monitor 06/06/15 ([facility]): IMPRESSION: Sinus rhythm at an average rate of 54 with episodes of nonsustained SVT.Several dropped beats with 2 pauses longest of which was 2.8 seconds during sleeping hours.   Past Medical History:  Diagnosis Date  . Arthritis    hands, knees  . Bulging lumbar disc   . Cancer [facility]) 2019   prostate  . Complication of anesthesia    during start of colonoscopy after medication given heart rate dropped so procedure was stopped  . Dysrhythmia    asymptomatic bradycardia;  palpitations  . Heart murmur    severe aortic stenosis  . History of concussion   . History of hiatal hernia   . Neuropathy   . Reflux    in past  . Severe aortic stenosis   . Thoracic ascending aortic aneurysm (HCC)    4.4 cm 11/30/18 CTA  . Weakness of both legs    pt says from Vit b12 deficiency  . Wears dentures    full upper and lower    Past Surgical History:  Procedure Laterality Date  . CATARACT EXTRACTION Left 2015  . COLONOSCOPY    . COLONOSCOPY WITH PROPOFOL N/A 05/17/2015   Procedure: COLONOSCOPY WITH PROPOFOL;  Surgeon: Lucilla Lame, MD;  Location: [place];  Service: Endoscopy;  Laterality: N/A;  . EYE SURGERY    . KNEE ARTHROSCOPY W/ MENISCAL REPAIR Right   . RADIOACTIVE SEED IMPLANT N/A 07/26/2017   Procedure: RADIOACTIVE SEED IMPLANT/BRACHYTHERAPY IMPLANT;  Surgeon: Hollice Espy, MD;  Location: [facility] ORS;  Service: Urology;  Laterality: N/A;  . RIGHT/LEFT HEART CATH AND CORONARY ANGIOGRAPHY Bilateral 11/15/2018   Procedure: RIGHT/LEFT HEART CATH AND CORONARY ANGIOGRAPHY;  Surgeon: Teodoro Spray, MD;  Location: [place] CV LAB;  Service: Cardiovascular;  Laterality:  Bilateral;  . TONSILLECTOMY    . TONSILLECTOMY AND ADENOIDECTOMY      MEDICATIONS: . aspirin 81 MG tablet  . Multiple Vitamin (MULTIVITAMIN WITH MINERALS) TABS tablet  . Omega-3 Fatty Acids (FISH OIL) 1000 MG CAPS  . vitamin B-12 (CYANOCOBALAMIN) 1000 MCG tablet   No current facility-administered medications for this encounter.    Derrill Memo ON 12/06/2018] cefUROXime (ZINACEF) 1.5 g in sodium chloride 0.9 % 100 mL IVPB  . [START ON 12/06/2018] dexmedetomidine (PRECEDEX) 400 MCG/100ML (4 mcg/mL) infusion  . [START ON 12/06/2018] heparin 30,000 units/NS 1000 mL solution for CELLSAVER  . [START ON 12/06/2018] magnesium sulfate (IV Push/IM) injection 40 mEq  . [START ON 12/06/2018] norepinephrine (LEVOPHED) 4mg  in 225mL premix infusion  . [START ON 12/06/2018] potassium chloride injection 80  mEq  . sodium chloride flush (NS) 0.9 % injection 3 mL  . [START ON 12/06/2018] vancomycin (VANCOCIN) 1,500 mg in sodium chloride 0.9 % 250 mL IVPB    Myra Gianotti, PA-C Surgical Short Stay/Anesthesiology [facility] Phone [phone] [facility] Phone [phone] 12/05/2018 11:32 AM

## 2018-12-06 ENCOUNTER — Ambulatory Visit (HOSPITAL_COMMUNITY): Payer: Medicare HMO

## 2018-12-06 ENCOUNTER — Inpatient Hospital Stay (HOSPITAL_COMMUNITY): Payer: Medicare HMO | Admitting: Certified Registered"

## 2018-12-06 ENCOUNTER — Encounter (HOSPITAL_COMMUNITY): Payer: Self-pay | Admitting: *Deleted

## 2018-12-06 ENCOUNTER — Inpatient Hospital Stay (HOSPITAL_COMMUNITY): Payer: Medicare HMO | Admitting: Vascular Surgery

## 2018-12-06 ENCOUNTER — Inpatient Hospital Stay (HOSPITAL_COMMUNITY): Payer: Medicare HMO

## 2018-12-06 ENCOUNTER — Encounter (HOSPITAL_COMMUNITY)
Admission: RE | Disposition: A | Discharge status: Home / Self Care | Payer: Medicare HMO | Source: Home / Self Care | Attending: Thoracic Surgery (Cardiothoracic Vascular Surgery)

## 2018-12-06 ENCOUNTER — Other Ambulatory Visit: Payer: Self-pay | Admitting: Physician Assistant

## 2018-12-06 ENCOUNTER — Inpatient Hospital Stay (HOSPITAL_COMMUNITY)
Admission: RE | Admit: 2018-12-06 | Discharge: 2018-12-07 | DRG: 266 | Disposition: A | Discharge status: Home / Self Care | Payer: Medicare HMO | Attending: Thoracic Surgery (Cardiothoracic Vascular Surgery) | Admitting: Thoracic Surgery (Cardiothoracic Vascular Surgery)

## 2018-12-06 ENCOUNTER — Other Ambulatory Visit: Payer: Self-pay

## 2018-12-06 DIAGNOSIS — Z885 Allergy status to narcotic agent status: Secondary | ICD-10-CM

## 2018-12-06 DIAGNOSIS — I712 Thoracic aortic aneurysm, without rupture: Secondary | ICD-10-CM | POA: Diagnosis present

## 2018-12-06 DIAGNOSIS — Z87898 Personal history of other specified conditions: Secondary | ICD-10-CM | POA: Diagnosis not present

## 2018-12-06 DIAGNOSIS — Z8261 Family history of arthritis: Secondary | ICD-10-CM

## 2018-12-06 DIAGNOSIS — Z006 Encounter for examination for normal comparison and control in clinical research program: Secondary | ICD-10-CM

## 2018-12-06 DIAGNOSIS — I350 Nonrheumatic aortic (valve) stenosis: Principal | ICD-10-CM | POA: Diagnosis present

## 2018-12-06 DIAGNOSIS — I444 Left anterior fascicular block: Secondary | ICD-10-CM | POA: Diagnosis present

## 2018-12-06 DIAGNOSIS — M1990 Unspecified osteoarthritis, unspecified site: Secondary | ICD-10-CM | POA: Diagnosis present

## 2018-12-06 DIAGNOSIS — Z833 Family history of diabetes mellitus: Secondary | ICD-10-CM | POA: Diagnosis not present

## 2018-12-06 DIAGNOSIS — Z87820 Personal history of traumatic brain injury: Secondary | ICD-10-CM | POA: Diagnosis not present

## 2018-12-06 DIAGNOSIS — R001 Bradycardia, unspecified: Secondary | ICD-10-CM | POA: Diagnosis not present

## 2018-12-06 DIAGNOSIS — K219 Gastro-esophageal reflux disease without esophagitis: Secondary | ICD-10-CM | POA: Diagnosis present

## 2018-12-06 DIAGNOSIS — I5033 Acute on chronic diastolic (congestive) heart failure: Secondary | ICD-10-CM | POA: Diagnosis present

## 2018-12-06 DIAGNOSIS — Z952 Presence of prosthetic heart valve: Secondary | ICD-10-CM

## 2018-12-06 DIAGNOSIS — Z79899 Other long term (current) drug therapy: Secondary | ICD-10-CM

## 2018-12-06 DIAGNOSIS — Z823 Family history of stroke: Secondary | ICD-10-CM

## 2018-12-06 DIAGNOSIS — Z7982 Long term (current) use of aspirin: Secondary | ICD-10-CM

## 2018-12-06 DIAGNOSIS — Z87891 Personal history of nicotine dependence: Secondary | ICD-10-CM | POA: Diagnosis not present

## 2018-12-06 DIAGNOSIS — Z8249 Family history of ischemic heart disease and other diseases of the circulatory system: Secondary | ICD-10-CM | POA: Diagnosis not present

## 2018-12-06 DIAGNOSIS — Z9842 Cataract extraction status, left eye: Secondary | ICD-10-CM

## 2018-12-06 DIAGNOSIS — Z8546 Personal history of malignant neoplasm of prostate: Secondary | ICD-10-CM

## 2018-12-06 DIAGNOSIS — I2510 Atherosclerotic heart disease of native coronary artery without angina pectoris: Secondary | ICD-10-CM | POA: Diagnosis present

## 2018-12-06 DIAGNOSIS — I7121 Aneurysm of the ascending aorta, without rupture: Secondary | ICD-10-CM | POA: Diagnosis present

## 2018-12-06 HISTORY — PX: TEE WITHOUT CARDIOVERSION: SHX5443

## 2018-12-06 HISTORY — DX: Gastro-esophageal reflux disease without esophagitis: K21.9

## 2018-12-06 HISTORY — DX: Bradycardia, unspecified: R00.1

## 2018-12-06 HISTORY — DX: Presence of prosthetic heart valve: Z95.2

## 2018-12-06 HISTORY — PX: TRANSCATHETER AORTIC VALVE REPLACEMENT, TRANSFEMORAL: SHX6400

## 2018-12-06 HISTORY — DX: Personal history of malignant neoplasm of prostate: Z85.46

## 2018-12-06 LAB — POCT I-STAT, CHEM 8
BUN: 12 mg/dL (ref 8–23)
Calcium, Ion: 1.22 mmol/L (ref 1.15–1.40)
Chloride: 105 mmol/L (ref 98–111)
Creatinine, Ser: 1.1 mg/dL (ref 0.61–1.24)
Glucose, Bld: 97 mg/dL (ref 70–99)
HCT: 34 % — ABNORMAL LOW (ref 39.0–52.0)
Hemoglobin: 11.6 g/dL — ABNORMAL LOW (ref 13.0–17.0)
Potassium: 3.9 mmol/L (ref 3.5–5.1)
Sodium: 142 mmol/L (ref 135–145)
TCO2: 26 mmol/L (ref 22–32)

## 2018-12-06 LAB — POCT I-STAT 4, (NA,K, GLUC, HGB,HCT)
Glucose, Bld: 88 mg/dL (ref 70–99)
Glucose, Bld: 100 mg/dL — ABNORMAL HIGH (ref 70–99)
HCT: 37 % — ABNORMAL LOW (ref 39.0–52.0)
HCT: 32 % — ABNORMAL LOW (ref 39.0–52.0)
Hemoglobin: 12.6 g/dL — ABNORMAL LOW (ref 13.0–17.0)
Hemoglobin: 10.9 g/dL — ABNORMAL LOW (ref 13.0–17.0)
Potassium: 3.9 mmol/L (ref 3.5–5.1)
Potassium: 3.6 mmol/L (ref 3.5–5.1)
Sodium: 141 mmol/L (ref 135–145)
Sodium: 143 mmol/L (ref 135–145)

## 2018-12-06 LAB — PSA: Prostatic Specific Antigen: 1.17 ng/mL (ref 0.00–4.00)

## 2018-12-06 SURGERY — IMPLANTATION, AORTIC VALVE, TRANSCATHETER, FEMORAL APPROACH
Anesthesia: Monitor Anesthesia Care | Site: Chest

## 2018-12-06 MED ORDER — SODIUM CHLORIDE 0.9 % IV SOLN
1.5000 g | Freq: Two times a day (BID) | INTRAVENOUS | Status: DC
  Administered 2018-12-06: 1.5 g via INTRAVENOUS
  Filled 2018-12-06 (×2): qty 1.5

## 2018-12-06 MED ORDER — ASPIRIN EC 81 MG PO TBEC
81.0000 mg | DELAYED_RELEASE_TABLET | Freq: Every day | ORAL | Status: DC
  Administered 2018-12-07: 08:00:00 81 mg via ORAL
  Filled 2018-12-06: qty 1

## 2018-12-06 MED ORDER — LIDOCAINE HCL 1 % IJ SOLN
INTRAMUSCULAR | Status: DC | PRN
  Administered 2018-12-06: 20 mL via INTRADERMAL

## 2018-12-06 MED ORDER — ACETAMINOPHEN 325 MG PO TABS: 650.0000 mg | ORAL_TABLET | Freq: Four times a day (QID) | ORAL | Status: DC | PRN

## 2018-12-06 MED ORDER — MIDAZOLAM HCL 2 MG/2ML IJ SOLN
INTRAMUSCULAR | Status: DC | PRN
  Administered 2018-12-06: 2 mg via INTRAVENOUS

## 2018-12-06 MED ORDER — OMEGA-3-ACID ETHYL ESTERS 1 G PO CAPS
1.0000 g | ORAL_CAPSULE | Freq: Every day | ORAL | Status: DC
  Administered 2018-12-07: 08:00:00 1 g via ORAL
  Filled 2018-12-06: qty 1

## 2018-12-06 MED ORDER — FENTANYL CITRATE (PF) 250 MCG/5ML IJ SOLN
INTRAMUSCULAR | Status: AC
  Filled 2018-12-06: qty 5

## 2018-12-06 MED ORDER — HEPARIN SODIUM (PORCINE) 1000 UNIT/ML IJ SOLN
INTRAMUSCULAR | Status: DC | PRN
  Administered 2018-12-06: 13000 [IU] via INTRAVENOUS

## 2018-12-06 MED ORDER — VANCOMYCIN HCL IN DEXTROSE 1-5 GM/200ML-% IV SOLN
1000.0000 mg | Freq: Once | INTRAVENOUS | Status: AC
  Administered 2018-12-06: 22:00:00 1000 mg via INTRAVENOUS
  Filled 2018-12-06: qty 200

## 2018-12-06 MED ORDER — ONDANSETRON HCL 4 MG/2ML IJ SOLN: 4.0000 mg | Freq: Four times a day (QID) | INTRAMUSCULAR | Status: DC | PRN

## 2018-12-06 MED ORDER — ASPIRIN 81 MG PO TABS: 81.0000 mg | ORAL_TABLET | ORAL | Status: DC

## 2018-12-06 MED ORDER — PROTAMINE SULFATE 10 MG/ML IV SOLN
INTRAVENOUS | Status: DC | PRN
  Administered 2018-12-06: 130 mg via INTRAVENOUS

## 2018-12-06 MED ORDER — CHLORHEXIDINE GLUCONATE 4 % EX LIQD: 60.0000 mL | Freq: Once | CUTANEOUS | Status: DC

## 2018-12-06 MED ORDER — 0.9 % SODIUM CHLORIDE (POUR BTL) OPTIME
TOPICAL | Status: DC | PRN
  Administered 2018-12-06: 1000 mL

## 2018-12-06 MED ORDER — IODIXANOL 320 MG/ML IV SOLN
INTRAVENOUS | Status: DC | PRN
  Administered 2018-12-06: 60.4 mL via INTRAVENOUS

## 2018-12-06 MED ORDER — PHENYLEPHRINE HCL-NACL 20-0.9 MG/250ML-% IV SOLN
0.0000 ug/min | INTRAVENOUS | Status: DC
  Filled 2018-12-06: qty 250

## 2018-12-06 MED ORDER — CHLORHEXIDINE GLUCONATE 4 % EX LIQD: 30.0000 mL | CUTANEOUS | Status: DC

## 2018-12-06 MED ORDER — FENTANYL CITRATE (PF) 250 MCG/5ML IJ SOLN
INTRAMUSCULAR | Status: DC | PRN
  Administered 2018-12-06 (×2): 50 ug via INTRAVENOUS

## 2018-12-06 MED ORDER — SODIUM CHLORIDE 0.9 % IV SOLN
INTRAVENOUS | Status: AC
  Filled 2018-12-06 (×3): qty 1.2

## 2018-12-06 MED ORDER — NITROGLYCERIN IN D5W 200-5 MCG/ML-% IV SOLN: 0.0000 ug/min | INTRAVENOUS | Status: DC

## 2018-12-06 MED ORDER — ACETAMINOPHEN 650 MG RE SUPP: 650.0000 mg | Freq: Four times a day (QID) | RECTAL | Status: DC | PRN

## 2018-12-06 MED ORDER — GLYCOPYRROLATE PF 0.2 MG/ML IJ SOSY
PREFILLED_SYRINGE | INTRAMUSCULAR | Status: DC | PRN
  Administered 2018-12-06: .2 mg via INTRAVENOUS

## 2018-12-06 MED ORDER — MORPHINE SULFATE (PF) 2 MG/ML IV SOLN: 1.0000 mg | INTRAVENOUS | Status: DC | PRN

## 2018-12-06 MED ORDER — PROPOFOL 500 MG/50ML IV EMUL
INTRAVENOUS | Status: DC | PRN
  Administered 2018-12-06: 25 ug/kg/min via INTRAVENOUS

## 2018-12-06 MED ORDER — SODIUM CHLORIDE 0.9% FLUSH
3.0000 mL | Freq: Two times a day (BID) | INTRAVENOUS | Status: DC
  Administered 2018-12-06: 3 mL via INTRAVENOUS

## 2018-12-06 MED ORDER — SODIUM CHLORIDE 0.9% FLUSH: 3.0000 mL | INTRAVENOUS | Status: DC | PRN

## 2018-12-06 MED ORDER — SODIUM CHLORIDE 0.9 % IV SOLN: 250.0000 mL | INTRAVENOUS | Status: DC | PRN

## 2018-12-06 MED ORDER — MIDAZOLAM HCL 2 MG/2ML IJ SOLN
INTRAMUSCULAR | Status: AC
  Filled 2018-12-06: qty 2

## 2018-12-06 MED ORDER — CLOPIDOGREL BISULFATE 75 MG PO TABS
75.0000 mg | ORAL_TABLET | Freq: Every day | ORAL | Status: DC
  Administered 2018-12-07: 08:00:00 75 mg via ORAL
  Filled 2018-12-06: qty 1

## 2018-12-06 MED ORDER — CHLORHEXIDINE GLUCONATE 0.12 % MT SOLN
15.0000 mL | Freq: Once | OROMUCOSAL | Status: AC
  Administered 2018-12-06: 15 mL via OROMUCOSAL
  Filled 2018-12-06: qty 15

## 2018-12-06 MED ORDER — SODIUM CHLORIDE 0.9 % IV SOLN
INTRAVENOUS | Status: DC
  Administered 2018-12-06: 09:00:00 via INTRAVENOUS

## 2018-12-06 MED ORDER — SODIUM CHLORIDE 0.9 % IV SOLN
INTRAVENOUS | Status: DC | PRN
  Administered 2018-12-06: 12:00:00 1500 mL via INTRAMUSCULAR

## 2018-12-06 MED ORDER — TRAMADOL HCL 50 MG PO TABS: 50.0000 mg | ORAL_TABLET | ORAL | Status: DC | PRN

## 2018-12-06 MED ORDER — PROPOFOL 10 MG/ML IV BOLUS
INTRAVENOUS | Status: DC | PRN
  Administered 2018-12-06: 20 mg via INTRAVENOUS

## 2018-12-06 MED ORDER — GLYCOPYRROLATE PF 0.2 MG/ML IJ SOSY
PREFILLED_SYRINGE | INTRAMUSCULAR | Status: AC
  Filled 2018-12-06: qty 1

## 2018-12-06 MED ORDER — OXYCODONE HCL 5 MG PO TABS: 5.0000 mg | ORAL_TABLET | ORAL | Status: DC | PRN

## 2018-12-06 MED ORDER — SODIUM CHLORIDE 0.9 % IV SOLN: INTRAVENOUS | Status: AC

## 2018-12-06 MED ORDER — LIDOCAINE HCL 1 % IJ SOLN
INTRAMUSCULAR | Status: AC
  Filled 2018-12-06: qty 20

## 2018-12-06 SURGICAL SUPPLY — 82 items
BAG DECANTER FOR FLEXI CONT (MISCELLANEOUS) ×2 IMPLANT
BAG SNAP BAND KOVER 36X36 (MISCELLANEOUS) ×4 IMPLANT
BLADE CLIPPER SURG (BLADE) ×2 IMPLANT
BLADE STERNUM SYSTEM 6 (BLADE) IMPLANT
BLADE SURG 10 STRL SS (BLADE) IMPLANT
CABLE ADAPT CONN TEMP 6FT (ADAPTER) ×4 IMPLANT
CANISTER SUCT 3000ML PPV (MISCELLANEOUS) IMPLANT
CATH DIAG EXPO 6F AL1 (CATHETERS) ×2 IMPLANT
CATH DIAG EXPO 6F VENT PIG 145 (CATHETERS) ×8 IMPLANT
CATH EXTERNAL FEMALE PUREWICK (CATHETERS) IMPLANT
CATH INFINITI 6F AL2 (CATHETERS) IMPLANT
CATH S G BIP PACING (CATHETERS) ×4 IMPLANT
CHLORAPREP W/TINT 26 (MISCELLANEOUS) ×4 IMPLANT
CLIP VESOCCLUDE MED 24/CT (CLIP) IMPLANT
CLIP VESOCCLUDE SM WIDE 24/CT (CLIP) IMPLANT
CLOSURE MYNX CONTROL 6F/7F (Vascular Products) ×2 IMPLANT
CONT SPEC 4OZ CLIKSEAL STRL BL (MISCELLANEOUS) ×8 IMPLANT
COVER BACK TABLE 80X110 HD (DRAPES) IMPLANT
COVER WAND RF STERILE (DRAPES) ×4 IMPLANT
DECANTER SPIKE VIAL GLASS SM (MISCELLANEOUS) ×4 IMPLANT
DERMABOND ADVANCED (GAUZE/BANDAGES/DRESSINGS) ×4
DERMABOND ADVANCED .7 DNX12 (GAUZE/BANDAGES/DRESSINGS) ×2 IMPLANT
DEVICE CLOSURE PERCLS PRGLD 6F (VASCULAR PRODUCTS) ×4 IMPLANT
DRAPE INCISE IOBAN 66X45 STRL (DRAPES) IMPLANT
DRSG TEGADERM 4X4.75 (GAUZE/BANDAGES/DRESSINGS) ×8 IMPLANT
ELECT CAUTERY BLADE 6.4 (BLADE) IMPLANT
ELECT REM PT RETURN 9FT ADLT (ELECTROSURGICAL) ×8
ELECTRODE REM PT RTRN 9FT ADLT (ELECTROSURGICAL) ×4 IMPLANT
FELT TEFLON 6X6 (MISCELLANEOUS) ×4 IMPLANT
GAUZE SPONGE 4X4 12PLY STRL (GAUZE/BANDAGES/DRESSINGS) ×4 IMPLANT
GLOVE BIO SURGEON STRL SZ7.5 (GLOVE) IMPLANT
GLOVE BIO SURGEON STRL SZ8 (GLOVE) IMPLANT
GLOVE EUDERMIC 7 POWDERFREE (GLOVE) IMPLANT
GLOVE ORTHO TXT STRL SZ7.5 (GLOVE) IMPLANT
GOWN STRL REUS W/ TWL LRG LVL3 (GOWN DISPOSABLE) IMPLANT
GOWN STRL REUS W/ TWL XL LVL3 (GOWN DISPOSABLE) ×2 IMPLANT
GOWN STRL REUS W/TWL LRG LVL3 (GOWN DISPOSABLE)
GOWN STRL REUS W/TWL XL LVL3 (GOWN DISPOSABLE) ×2
GUIDEWIRE SAF TJ AMPL .035X180 (WIRE) ×4 IMPLANT
GUIDEWIRE SAFE TJ AMPLATZ EXST (WIRE) ×4 IMPLANT
INSERT FOGARTY SM (MISCELLANEOUS) IMPLANT
KIT BASIN OR (CUSTOM PROCEDURE TRAY) ×4 IMPLANT
KIT HEART LEFT (KITS) ×4 IMPLANT
KIT SUCTION CATH 14FR (SUCTIONS) IMPLANT
KIT TURNOVER KIT B (KITS) ×4 IMPLANT
LOOP VESSEL MAXI BLUE (MISCELLANEOUS) IMPLANT
LOOP VESSEL MINI RED (MISCELLANEOUS) IMPLANT
NS IRRIG 1000ML POUR BTL (IV SOLUTION) ×4 IMPLANT
PACK ENDO MINOR (CUSTOM PROCEDURE TRAY) ×4 IMPLANT
PAD ARMBOARD 7.5X6 YLW CONV (MISCELLANEOUS) ×8 IMPLANT
PAD ELECT DEFIB RADIOL ZOLL (MISCELLANEOUS) ×4 IMPLANT
PENCIL BUTTON HOLSTER BLD 10FT (ELECTRODE) IMPLANT
PERCLOSE PROGLIDE 6F (VASCULAR PRODUCTS) ×8
POSITIONER HEAD DONUT 9IN (MISCELLANEOUS) ×4 IMPLANT
SET MICROPUNCTURE 5F STIFF (MISCELLANEOUS) ×4 IMPLANT
SHEATH BRITE TIP 7FR 35CM (SHEATH) ×4 IMPLANT
SHEATH PINNACLE 6F 10CM (SHEATH) ×4 IMPLANT
SHEATH PINNACLE 8F 10CM (SHEATH) ×4 IMPLANT
SLEEVE REPOSITIONING LENGTH 30 (MISCELLANEOUS) ×4 IMPLANT
STOPCOCK MORSE 400PSI 3WAY (MISCELLANEOUS) ×8 IMPLANT
SUT ETHIBOND X763 2 0 SH 1 (SUTURE) IMPLANT
SUT GORETEX CV 4 TH 22 36 (SUTURE) IMPLANT
SUT GORETEX CV4 TH-18 (SUTURE) IMPLANT
SUT MNCRL AB 3-0 PS2 18 (SUTURE) IMPLANT
SUT PROLENE 5 0 C 1 36 (SUTURE) IMPLANT
SUT PROLENE 6 0 C 1 30 (SUTURE) IMPLANT
SUT SILK  1 MH (SUTURE) ×2
SUT SILK 1 MH (SUTURE) ×2 IMPLANT
SUT VIC AB 2-0 CT1 27 (SUTURE)
SUT VIC AB 2-0 CT1 TAPERPNT 27 (SUTURE) IMPLANT
SUT VIC AB 2-0 CTX 36 (SUTURE) IMPLANT
SUT VIC AB 3-0 SH 8-18 (SUTURE) IMPLANT
SYR 50ML LL SCALE MARK (SYRINGE) ×4 IMPLANT
SYR BULB IRRIGATION 50ML (SYRINGE) IMPLANT
SYR MEDRAD MARK V 150ML (SYRINGE) ×4 IMPLANT
TOWEL GREEN STERILE (TOWEL DISPOSABLE) ×8 IMPLANT
TRANSDUCER W/STOPCOCK (MISCELLANEOUS) ×8 IMPLANT
TRAY FOLEY SLVR 14FR TEMP STAT (SET/KITS/TRAYS/PACK) IMPLANT
TUBE SUCT INTRACARD DLP 20F (MISCELLANEOUS) IMPLANT
VALVE 26 ULTRA SAPIEN KIT (Valve) ×2 IMPLANT
WIRE EMERALD 3MM-J .035X150CM (WIRE) ×4 IMPLANT
WIRE EMERALD 3MM-J .035X260CM (WIRE) ×4 IMPLANT

## 2018-12-06 NOTE — Discharge Instructions (Signed)
ACTIVITY AND EXERCISE °• Daily activity and exercise are an important part of your recovery. People recover at different rates depending on their general health and type of valve procedure. °• Most people recovering from TAVR feel better relatively quickly  °• No lifting, pushing, pulling more than 10 pounds (examples to avoid: groceries, vacuuming, gardening, golfing): °            - For one week with a procedure through the groin. °            - For six weeks for procedures through the chest wall or neck. °NOTE: You will typically see one of our providers 7-14 days after your procedure to discuss WHEN TO RESUME the above activities.  °  °  °DRIVING °• Do not drive until you are seen for follow up and cleared by a provider. Generally, we ask patient to not drive for 1 week after their procedure. °• If you have been told by your doctor in the past that you may not drive, you must talk with him/her before you begin driving again. °  °DRESSING °• Groin site: you may leave the clear dressing over the site for up to one week or until it falls off. °  °HYGIENE °• If you had a femoral (leg) procedure, you may take a shower when you return home. After the shower, pat the site dry. Do NOT use powder, oils or lotions in your groin area until the site has completely healed. °• If you had a chest procedure, you may shower when you return home unless specifically instructed not to by your discharging practitioner. °            - DO NOT scrub incision; pat dry with a towel. °            - DO NOT apply any lotions, oils, powders to the incision. °            - No tub baths / swimming for at least 2 weeks. °• If you notice any fevers, chills, increased pain, swelling, bleeding or pus, please contact your doctor. °  °ADDITIONAL INFORMATION °• If you are going to have an upcoming dental procedure, please contact our office as you will require antibiotics ahead of time to prevent infection on your heart valve.  ° ° °If you have any  questions or concerns you can call the structural heart phone during normal business hours 8am-4pm. If you have an urgent need after hours or weekends please call [phone] to talk to the on call provider for general cardiology. If you have an emergency that requires immediate attention, please call 911.  ° ° °After TAVR Checklist ° °Check  Test Description  ° Follow up appointment in 1-2 weeks  You will see our structural heart physician assistant, Katie Sarann Tregre. Your incision sites will be checked and you will be cleared to drive and resume all normal activities if you are doing well.    ° 1 month echo and follow up  You will have an echo to check on your new heart valve and be seen back in the office by Katie Malaak Stach. Many times the echo is not read by your appointment time, but Katie will call you later that day or the following day to report your results.  ° Follow up with your primary cardiologist You will need to be seen by your primary cardiologist in the following 3-6 months after your 1 month appointment in the valve   clinic. Often times your Plavix or Aspirin will be discontinued during this time, but this is decided on a case by case basis.   ° 1 year echo and follow up You will have another echo to check on your heart valve after 1 year and be seen back in the office by Katie Jacklyn Branan. This your last structural heart visit.  ° Bacterial endocarditis prophylaxis  You will have to take antibiotics for the rest of your life before all dental procedures (even teeth cleanings) to protect your heart valve. Antibiotics are also required before some surgeries. Please check with your cardiologist before scheduling any surgeries. Also, please make sure to tell us if you have a penicillin allergy as you will require an alternative antibiotic.   ° ° °

## 2018-12-06 NOTE — Anesthesia Preprocedure Evaluation (Signed)
Anesthesia Evaluation  Patient identified by MRN, date of birth, ID band Patient awake    Reviewed: Allergy & Precautions, H&P , NPO status , Patient's Chart, lab work & pertinent test results  Airway Mallampati: II   Neck ROM: full    Dental   Pulmonary former smoker,    breath sounds clear to auscultation       Cardiovascular + Valvular Problems/Murmurs AS  Rhythm:regular Rate:Normal     Neuro/Psych    GI/Hepatic hiatal hernia, GERD  ,  Endo/Other    Renal/GU      Musculoskeletal  (+) Arthritis ,   Abdominal   Peds  Hematology   Anesthesia Other Findings   Reproductive/Obstetrics                             Anesthesia Physical Anesthesia Plan  ASA: III  Anesthesia Plan: MAC   Post-op Pain Management:    Induction: Intravenous  PONV Risk Score and Plan: 1 and Ondansetron, Dexamethasone and Treatment may vary due to age or medical condition  Airway Management Planned: Simple Face Mask  Additional Equipment: Arterial line  Intra-op Plan:   Post-operative Plan:   Informed Consent: I have reviewed the patients History and Physical, chart, labs and discussed the procedure including the risks, benefits and alternatives for the proposed anesthesia with the patient or authorized representative who has indicated his/her understanding and acceptance.       Plan Discussed with: CRNA, Anesthesiologist and Surgeon  Anesthesia Plan Comments:         Anesthesia Quick Evaluation

## 2018-12-06 NOTE — Op Note (Signed)
[facility]ART AND VASCULAR CENTER   MULTIDISCIPLINARY HEART VALVE TEAM   TAVR OPERATIVE NOTE   Date of Procedure:  12/06/2018  Preoperative Diagnosis: Severe Aortic Stenosis   Postoperative Diagnosis: Same   Procedure:    Transcatheter Aortic Valve Replacement - Percutaneous Transfemoral Approach  Edwards Sapien 3 UltraTHV (size 26 mm, model # 9750TFX, serial # [id number])   Co-Surgeons:  Valentina Gu. Roxy Manns, MD and Sherren Mocha, MD  Anesthesiologist:  Albertha Ghee, MD  Echocardiographer:  Jenkins Rouge, MD  Pre-operative Echo Findings:  Severe aortic stenosis  Normal left ventricular systolic function  Post-operative Echo Findings:  No paravalvular leak  Normal left ventricular systolic function  BRIEF CLINICAL NOTE AND INDICATIONS FOR SURGERY  73 year old gentleman with progressive and now severe symptomatic aortic stenosis, mean transvalvular gradient about 60 mmHg with preserved LV function.  He has developed worsening shortness of breath with activity, chest tightness, and has had syncope.  He has undergone extensive multidisciplinary heart team review with studies including right and left heart catheterization demonstrating no obstructive CAD, echo demonstrating normal LV function and no concomitant valvular disease, and CTA studies demonstrating suitable transfemoral access for TAVR with a 26 mm transcatheter heart valve.  During the course of the patient's preoperative work up they have been evaluated comprehensively by a multidisciplinary team of specialists coordinated through the [facility] in the [facility].  They have been demonstrated to suffer from symptomatic severe aortic stenosis as noted above. The patient has been counseled extensively as to the relative risks and benefits of all options for the treatment of severe aortic stenosis including long term medical therapy, conventional surgery for aortic valve  replacement, and transcatheter aortic valve replacement.  The patient has been independently evaluated in formal cardiac surgical consultation by Dr Roxy Manns, who deemed the patient appropriate for TAVR. Based upon review of all of the patient's preoperative diagnostic tests they are felt to be candidate for transcatheter aortic valve replacement using the transfemoral approach as an alternative to conventional surgery.    Following the decision to proceed with transcatheter aortic valve replacement, a discussion has been held regarding what types of management strategies would be attempted intraoperatively in the event of life-threatening complications, including whether or not the patient would be considered a candidate for the use of cardiopulmonary bypass and/or conversion to open sternotomy for attempted surgical intervention.  The patient has been advised of a variety of complications that might develop peculiar to this approach including but not limited to risks of death, stroke, paravalvular leak, aortic dissection or other major vascular complications, aortic annulus rupture, device embolization, cardiac rupture or perforation, acute myocardial infarction, arrhythmia, heart block or bradycardia requiring permanent pacemaker placement, congestive heart failure, respiratory failure, renal failure, pneumonia, infection, other late complications related to structural valve deterioration or migration, or other complications that might ultimately cause a temporary or permanent loss of functional independence or other long term morbidity.  The patient provides full informed consent for the procedure as described and all questions were answered preoperatively.  DETAILS OF THE OPERATIVE PROCEDURE  PREPARATION:   The patient is brought to the operating room on the above mentioned date and central monitoring was established by the anesthesia team including placement of a central venous catheter and radial arterial  line. The patient is placed in the supine position on the operating table.  Intravenous antibiotics are administered. The patient is monitored closely throughout the procedure under conscious sedation.  Baseline transthoracic echocardiogram  is performed. The patient's chest, abdomen, both groins, and both lower extremities are prepared and draped in a sterile manner. A time out procedure is performed.   PERIPHERAL ACCESS:   Using ultrasound guidance, femoral arterial and venous access is obtained with placement of 6 Fr sheaths on the left side.  A pigtail diagnostic catheter was passed through the femoral arterial sheath under fluoroscopic guidance into the aortic root.  A temporary transvenous pacemaker catheter was passed through the femoral venous sheath under fluoroscopic guidance into the right ventricle.  The pacemaker was tested to ensure stable lead placement and pacemaker capture. Aortic root angiography was performed in order to determine the optimal angiographic angle for valve deployment.  TRANSFEMORAL ACCESS:  A micropuncture technique is used to access the right femoral artery under fluoroscopic and ultrasound guidance.  2 Perclose devices are deployed at 10' and 2' positions to 'PreClose' the femoral artery. An 8 French sheath is placed and then an Amplatz Superstiff wire is advanced through the sheath. This is changed out for a 14 French transfemoral E-Sheath after progressively dilating over the Superstiff wire.  An AL-1 catheter was used to direct a straight-tip exchange length wire across the native aortic valve into the left ventricle. This was exchanged out for a pigtail catheter and position was confirmed in the LV apex. Simultaneous LV and Ao pressures were recorded.  The pigtail catheter was exchanged for an Amplatz Extra-stiff wire in the LV apex.    BALLOON AORTIC VALVULOPLASTY:  Not performed  TRANSCATHETER HEART VALVE DEPLOYMENT:  An Edwards Sapien 3 transcatheter heart  valve (size 26 mm) was prepared and crimped per manufacturer's guidelines, and the proper orientation of the valve is confirmed on the [employer] delivery system. The valve was advanced through the introducer sheath using normal technique until in an appropriate position in the abdominal aorta beyond the sheath tip. The balloon was then retracted and using the fine-tuning wheel was centered on the valve. The valve was then advanced across the aortic arch using appropriate flexion of the catheter. The valve was carefully positioned across the aortic valve annulus. The Commander catheter was retracted using normal technique. Once final position of the valve has been confirmed by angiographic assessment, the valve is deployed while temporarily holding ventilation and during rapid ventricular pacing to maintain systolic blood pressure < 50 mmHg and pulse pressure < 10 mmHg. The balloon inflation is held for >3 seconds after reaching full deployment volume. Once the balloon has fully deflated the balloon is retracted into the ascending aorta and valve function is assessed using echocardiography. The patient's hemodynamic recovery following valve deployment is good.  The patient does have transient heart block following valve deployment and transvenous pacing is performed.  His heart rhythm recovers and he is in normal sinus rhythm at the completion of the procedure.  The deployment balloon and guidewire are both removed. Echo demostrated acceptable post-procedural gradients, stable mitral valve function, and no aortic insufficiency. Aortography confirmed no aortic valve insufficiency.   PROCEDURE COMPLETION:  The sheath was removed and femoral artery closure is performed using the 2 previously deployed Perclose devices.  Protamine is administered once femoral arterial repair was complete. The site is clear with no evidence of bleeding or hematoma after the sutures are tightened. The temporary pacemaker and  pigtail catheters are removed. Mynx closure is used for left femoral hemostasis.  The patient tolerated the procedure well and is transported to the surgical intensive care in stable condition. There were no  immediate intraoperative complications. All sponge instrument and needle counts are verified correct at completion of the operation.   The patient received a total of 60 mL of intravenous contrast during the procedure.   Sherren Mocha, MD 12/06/2018 1:07 PM

## 2018-12-06 NOTE — Progress Notes (Signed)
Spoke with Dr Copper ref to pt heart rate dipping to 20's. He advised to watch pt closely. Pt is asymptomatic, all vitals stable. Jerald Kief, RN

## 2018-12-06 NOTE — Op Note (Signed)
[facility]   MULTIDISCIPLINARY HEART VALVE TEAM   TAVR OPERATIVE NOTE   Date of Procedure:  12/06/2018  Preoperative Diagnosis: Severe Aortic Stenosis   Postoperative Diagnosis: Same   Procedure:    Transcatheter Aortic Valve Replacement - Percutaneous Right Transfemoral Approach  Edwards Sapien 3 Ultra THV (size 26 mm, model # 9750TFX, serial # [id number])   Co-Surgeons:  Valentina Gu. Roxy Manns, MD and Sherren Mocha, MD  Anesthesiologist:  Albertha Ghee, MD  Echocardiographer:  Jenkins Rouge, MD  Pre-operative Echo Findings:  Severe aortic stenosis  Normal left ventricular systolic function  Post-operative Echo Findings:  No paravalvular leak  Normal left ventricular systolic function   BRIEF CLINICAL NOTE AND INDICATIONS FOR SURGERY  Patient is a 73 year old male with history of aortic stenosis, sinus bradycardia, prostate cancer and degenerative arthritis who has been referred for surgical consultation to discuss treatment options for management of severe symptomatic aortic stenosis.  Patient has known of presence of a heart murmur for more than 10 years.  He has been followed regularly by Dr. Ubaldo Glassing using serial echocardiograms that have documented the presence of aortic stenosis that has gradually progressed in severity.  Patient has remained healthy and physically active until fairly recently when he began to experience episodes of exertional chest tightness and shortness of breath that only occur with moderate or strenuous physical activity.  In June the patient was working outdoors when he suffered a syncopal event.  He was taken to [facility] where troponin levels were minimally elevated.  Follow-up echocardiogram revealed normal left ventricular systolic function with severe aortic stenosis.  The patient has been followed since then by Dr. Ubaldo Glassing and underwent Holter monitor that revealed sinus bradycardia with no evidence for AV block or symptomatic bradycardia.   Diagnostic cardiac catheterization was performed demonstrating normal coronary artery anatomy with no significant coronary artery disease.  Cardiothoracic surgical consultation was requested.  During the course of the patient's preoperative work up they have been evaluated comprehensively by a multidisciplinary team of specialists coordinated through the [facility] in the [facility].  They have been demonstrated to suffer from symptomatic severe aortic stenosis as noted above. The patient has been counseled extensively as to the relative risks and benefits of all options for the treatment of severe aortic stenosis including long term medical therapy, conventional surgery for aortic valve replacement, and transcatheter aortic valve replacement.  All questions have been answered, and the patient provides full informed consent for the operation as described.   DETAILS OF THE OPERATIVE PROCEDURE  PREPARATION:    The patient is brought to the operating room on the above mentioned date and appropriate monitoring was established by the anesthesia team. The patient is placed in the supine position on the operating table.  Intravenous antibiotics are administered. The patient is monitored closely throughout the procedure under conscious sedation.  Baseline transthoracic echocardiogram was performed. The patient's chest, abdomen, both groins, and both lower extremities are prepared and draped in a sterile manner. A time out procedure is performed.   PERIPHERAL ACCESS:    Using the modified Seldinger technique, femoral arterial and venous access was obtained with placement of 6 Fr sheaths on the left side.  A pigtail diagnostic catheter was passed through the left arterial sheath under fluoroscopic guidance into the aortic root.  A temporary transvenous pacemaker catheter was passed through the left femoral venous sheath under fluoroscopic guidance into the  right ventricle.  The pacemaker was  tested to ensure stable lead placement and pacemaker capture. Aortic root angiography was performed in order to determine the optimal angiographic angle for valve deployment.   TRANSFEMORAL ACCESS:   Percutaneous transfemoral access and sheath placement was performed using ultrasound guidance.  The right common femoral artery was cannulated using a micropuncture needle and appropriate location was verified using hand injection angiogram.  A pair of Abbott Perclose percutaneous closure devices were placed and a 6 French sheath replaced into the femoral artery.  The patient was heparinized systemically and ACT verified > 250 seconds.    A 14 Fr transfemoral E-sheath was introduced into the right common femoral artery after progressively dilating over an Amplatz superstiff wire. An AL-2 catheter was used to direct a straight-tip exchange length wire across the native aortic valve into the left ventricle. This was exchanged out for a pigtail catheter and position was confirmed in the LV apex. Simultaneous LV and Ao pressures were recorded.  The pigtail catheter was exchanged for an Amplatz Extra-stiff wire in the LV apex.  Echocardiography was utilized to confirm appropriate wire position and no sign of entanglement in the mitral subvalvular apparatus.   TRANSCATHETER HEART VALVE DEPLOYMENT:   An Edwards Sapien 3 Ultra transcatheter heart valve (size 26 mm, model #[id number], serial [id number]) was prepared and crimped per manufacturer's guidelines, and the proper orientation of the valve is confirmed on the [employer] delivery system. The valve was advanced through the introducer sheath using normal technique until in an appropriate position in the abdominal aorta beyond the sheath tip. The balloon was then retracted and using the fine-tuning wheel was centered on the valve. The valve was then advanced across the aortic arch using appropriate flexion of the catheter.  The valve was carefully positioned across the aortic valve annulus. The Commander catheter was retracted using normal technique. Once final position of the valve has been confirmed by angiographic assessment, the valve is deployed while temporarily holding ventilation and during rapid ventricular pacing to maintain systolic blood pressure < 50 mmHg and pulse pressure < 10 mmHg. The balloon inflation is held for >3 seconds after reaching full deployment volume. Once the balloon has fully deflated the balloon is retracted into the ascending aorta and valve function is assessed using echocardiography. There is felt to be no paravalvular leak and no central aortic insufficiency.  The patient's hemodynamic recovery following valve deployment is good.  The deployment balloon and guidewire are both removed.    PROCEDURE COMPLETION:   The sheath was removed and femoral artery closure performed.  Protamine was administered once femoral arterial repair was complete. The temporary pacemaker, pigtail catheters and femoral sheaths were removed with manual pressure used for hemostasis.  A Mynx femoral closure device was utilized following removal of the diagnostic sheath in the left femoral artery.  The patient tolerated the procedure well and is transported to the surgical intensive care in stable condition. There were no immediate intraoperative complications. All sponge instrument and needle counts are verified correct at completion of the operation.   No blood products were administered during the operation.  The patient received a total of 60 mL of intravenous contrast during the procedure.   Rexene Alberts, MD 12/06/2018 12:57 PM

## 2018-12-06 NOTE — Progress Notes (Signed)
Site area: left radial arterial line Site Prior to Removal:  Level 0 Pressure Applied For: 10 minutes Manual:   yes Patient Status During Pull:  stable Post Pull Site:  Level 0 Post Pull Instructions Given:  yes Post Pull Pulses Present: left radial 2+ palpable Dressing Applied:  Gauze and tegaderm Bedrest begins @ NA Comments:

## 2018-12-06 NOTE — Transfer of Care (Signed)
Immediate Anesthesia Transfer of Care Note  Patient: Gregory Black  Procedure(s) Performed: TRANSCATHETER AORTIC VALVE REPLACEMENT, TRANSFEMORAL approach with insertion of 33m ultra sapien edwards valve (N/A Chest) TRANSESOPHAGEAL ECHOCARDIOGRAM (TEE) (N/A )  Patient Location: PACU  Anesthesia Type:MAC  Level of Consciousness: drowsy and patient cooperative  Airway & Oxygen Therapy: Patient Spontanous Breathing and Patient connected to nasal cannula oxygen  Post-op Assessment: Report given to RN and Post -op Vital signs reviewed and stable  Post vital signs: Reviewed and stable  Last Vitals:  Vitals Value Taken Time  BP 122/66 12/06/18 1307  Temp 36.6 C 12/06/18 1300  Pulse 47 12/06/18 1309  Resp 11 12/06/18 1309  SpO2 99 % 12/06/18 1309  Vitals shown include unvalidated device data.  Last Pain:  Vitals:   12/06/18 1300  TempSrc: Temporal  PainSc:       Patients Stated Pain Goal: 2 [id number]  Complications: No apparent anesthesia complications

## 2018-12-06 NOTE — Progress Notes (Signed)
  Echocardiogram 2D Echocardiogram has been performed.  Matilde Bash 12/06/2018, 12:44 PM

## 2018-12-06 NOTE — H&P (Signed)
Cardiology Admission History and Physical:   Patient ID: Gregory Black MRN: [id number]; DOB: [birth year]   Admission date: 12/06/2018  Primary Care Provider: Kirk Ruths, MD Primary Cardiologist:  Dr Ubaldo Glassing Primary Electrophysiologist:  None   Chief Complaint:  Presents for TAVR  Patient Profile:   Gregory Black is a 73 y.o. male with severe symptomatic aortic stenosis, presenting for TAVR  History of Present Illness:   Mr. 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  He has been followed for a heart murmur for many years and has been diagnosed with aortic stenosis.  He has developed exertional chest tightness and dyspnea with moderate or strenuous activities.  The patient had a syncopal event earlier this year.  He underwent cardiac catheterization demonstrating normal coronary arteries without any obstructive disease.  An echocardiogram demonstrated normal LV systolic function and severe aortic stenosis with a peak velocity of 4.8 m/s and mean gradient of 59 mmHg with a calculated aortic valve area of 0.63 cm.  The patient has undergone formal cardiac surgical consultation and heart valve team review of his case.  He presents today for TAVR.  He continues to complain of shortness of breath with discomfort in the chest associated with any moderate physical activity.  He denies orthopnea, PND, or leg swelling.  He has had no recurrence of syncope.  He has no other complaints today.  Heart Pathway Score:     Past Medical History:  Diagnosis Date  . Arthritis    hands, knees  . Bulging lumbar disc   . Cancer [facility]) 2019   prostate  . Complication of anesthesia    during start of colonoscopy after medication given heart rate dropped so procedure was stopped  . Dysrhythmia    asymptomatic bradycardia; palpitations  . Heart murmur    severe aortic stenosis  . History of concussion   . History of  hiatal hernia   . Neuropathy   . Reflux    in past  . Severe aortic stenosis   . Thoracic ascending aortic aneurysm (HCC)    4.4 cm 11/30/18 CTA  . Weakness of both legs    pt says from Vit b12 deficiency  . Wears dentures    full upper and lower    Past Surgical History:  Procedure Laterality Date  . CARDIAC CATHETERIZATION     No significant CAD 10/2018  . CATARACT EXTRACTION Left 2015  . COLONOSCOPY    . COLONOSCOPY WITH PROPOFOL N/A 05/17/2015   Procedure: COLONOSCOPY WITH PROPOFOL;  Surgeon: Lucilla Lame, MD;  Location: [place];  Service: Endoscopy;  Laterality: N/A;  . EYE SURGERY    . KNEE ARTHROSCOPY W/ MENISCAL REPAIR Right   . RADIOACTIVE SEED IMPLANT N/A 07/26/2017   Procedure: RADIOACTIVE SEED IMPLANT/BRACHYTHERAPY IMPLANT;  Surgeon: Hollice Espy, MD;  Location: [facility];  Service: Urology;  Laterality: N/A;  . RIGHT/LEFT HEART CATH AND CORONARY ANGIOGRAPHY Bilateral 11/15/2018   Procedure: RIGHT/LEFT HEART CATH AND CORONARY ANGIOGRAPHY;  Surgeon: Teodoro Spray, MD;  Location: [place] [facility];  Service: Cardiovascular;  Laterality: Bilateral;  . TONSILLECTOMY    . TONSILLECTOMY AND ADENOIDECTOMY       Medications Prior to Admission: Prior to Admission medications   Medication Sig Start Date End Date Taking? Authorizing Provider  aspirin 81 MG tablet Take 81 mg by mouth once a week.    Yes [provider]  Multiple Vitamin (MULTIVITAMIN WITH  MINERALS) TABS tablet Take 1 tablet by mouth daily.   Yes [provider]  Omega-3 Fatty Acids (FISH OIL) 1000 MG CAPS Take 1,000 mg by mouth daily.   Yes [provider]  vitamin B-12 (CYANOCOBALAMIN) 1000 MCG tablet Take 1,000 mcg by mouth daily.   Yes [provider]     Allergies:    Allergies  Allergen Reactions  . Vicodin [Hydrocodone-Acetaminophen] Other (See Comments)    Unable to void or empty bowels  . Pollen Extract Other (See Comments)    Sneezing, runny eyes     Social History:   Social History   Socioeconomic History  . Marital status: Married    Spouse name: Not on file  . Number of children: Not on file  . Years of education: Not on file  . Highest education level: Not on file  Occupational History  . Not on file  Social Needs  . Financial resource strain: Not on file  . Food insecurity    Worry: Not on file    Inability: Not on file  . Transportation needs    Medical: Not on file    Non-medical: Not on file  Tobacco Use  . Smoking status: Former Smoker    Quit date: 12/02/1998    Years since quitting: 20.0  . Smokeless tobacco: Never Used  . Tobacco comment: quit approx 2010  Substance and Sexual Activity  . Alcohol use: Yes    Alcohol/week: 1.0 standard drinks    Types: 1 Cans of beer per week    Comment: 1 beer a week  . Drug use: No  . Sexual activity: Not on file  Lifestyle  . Physical activity    Days per week: Not on file    Minutes per session: Not on file  . Stress: Not on file  Relationships  . Social Herbalist on phone: Not on file    Gets together: Not on file    Attends religious service: Not on file    Active member of club or organization: Not on file    Attends meetings of clubs or organizations: Not on file    Relationship status: Not on file  . Intimate partner violence    Fear of current or ex partner: Not on file    Emotionally abused: Not on file    Physically abused: Not on file    Forced sexual activity: Not on file  Other Topics Concern  . Not on file  Social History Narrative  . Not on file    Family History:   The patient's family history includes Arthritis in his father; Cancer in his brother and father; Diabetes in his brother and mother; Heart disease in his mother; Hypertension in his mother; Lung disease in his brother; Stroke in his mother.    ROS:  Please see the history of present illness.  All other ROS reviewed and negative.     Physical Exam/Data:  There were  no vitals filed for this visit. No intake or output data in the 24 hours ending 12/06/18 0809 Last 3 Weights 12/02/2018 11/17/2018 11/15/2018  Weight (lbs) 200 lb 8 oz 195 lb 196 lb  Weight (kg) 90.946 kg 88.451 kg 88.905 kg     There is no height or weight on file to calculate BMI.  General:  Well nourished, well developed, in no acute distress HEENT: normal Lymph: no adenopathy Neck: no JVD Endocrine:  No thryomegaly Vascular: No carotid bruits; FA  pulses 2+ bilaterally  Cardiac:  normal S1, S2; RRR; 3/6 harsh late peaking systolic murmur heard throughout, loudest at the right upper sternal border Lungs:  clear to auscultation bilaterally, no wheezing, rhonchi or rales  Abd: soft, nontender, no hepatomegaly  Ext: Trace bilateral pretibial edema Musculoskeletal:  No deformities, BUE and BLE strength normal and equal Skin: warm and dry  Neuro:  CNs 2-12 intact, no focal abnormalities noted Psych:  Normal affect    EKG:  The ECG that was done 12/02/2018 was personally reviewed and demonstrates sinus bradycardia with left anterior fascicular block  Relevant CV Studies: Echo: IMPRESSIONS    1. The left ventricle has normal systolic function with an ejection fraction of 60-65%. The cavity size was normal. There is mildly increased left ventricular wall thickness. Left ventricular diastolic Doppler parameters are indeterminate.  2. The right ventricle has normal systolic function. The cavity was normal. There is no increase in right ventricular wall thickness.  3. Aortic valve regurgitation was not assessed by color flow Doppler. Severe stenosis of the aortic valve.  FINDINGS  Left Ventricle: The left ventricle has normal systolic function, with an ejection fraction of 60-65%. The cavity size was normal. There is mildly increased left ventricular wall thickness. Left ventricular diastolic Doppler parameters are indeterminate.  Right Ventricle: The right ventricle has normal systolic  function. The cavity was normal. There is no increase in right ventricular wall thickness.  Left Atrium: Left atrial size was normal in size.  Right Atrium: Right atrial size was normal in size. Right atrial pressure is estimated at 10 mmHg.  Interatrial Septum: No atrial level shunt detected by color flow Doppler.  Pericardium: There is no evidence of pericardial effusion.  Mitral Valve: The mitral valve is normal in structure. Mitral valve regurgitation is mild by color flow Doppler.  Tricuspid Valve: The tricuspid valve is normal in structure. Tricuspid valve regurgitation is mild by color flow Doppler.  Aortic Valve: The aortic valve is normal in structure. Aortic valve regurgitation was not assessed by color flow Doppler. There is Severe stenosis of the aortic valve, with a calculated valve area of 0.65 cm.  Pulmonic Valve: The pulmonic valve was normal in structure. Pulmonic valve regurgitation was not assessed by color flow Doppler.  Venous: The inferior vena cava is normal in size with greater than 50% respiratory variability.    +--------------+--------++ LEFT VENTRICLE              +----------------+---------++ +--------------+--------++      Diastology                PLAX 2D                     +----------------+---------++ +--------------+--------++      LV e' lateral:  3.64 cm/s LVIDd:        4.76 cm       +----------------+---------++ +--------------+--------++      LV E/e' lateral:18.7      LVIDs:        2.75 cm       +----------------+---------++ +--------------+--------++      LV e' medial:   3.02 cm/s LV PW:        1.61 cm       +----------------+---------++ +--------------+--------++      LV E/e' medial: 22.5      LV IVS:       1.88 cm       +----------------+---------++ +--------------+--------++ LVOT diam:    2.30 cm  +--------------+--------++  LV SV:        77 ml    +--------------+--------++  LV SV Index:  35.19    +--------------+--------++ LVOT Area:    4.15 cm +--------------+--------++                        +--------------+--------++   +------------------+---------++ LV Volumes (MOD)            +------------------+---------++ LV area d, A2C:   34.50 cm +------------------+---------++ LV area d, A4C:   31.50 cm +------------------+---------++ LV area s, A2C:   18.00 cm +------------------+---------++ LV area s, A4C:   18.00 cm +------------------+---------++ LV major d, A2C:  8.95 cm   +------------------+---------++ LV major d, A4C:  9.06 cm   +------------------+---------++ LV major s, A2C:  7.22 cm   +------------------+---------++ LV major s, A4C:  7.88 cm   +------------------+---------++ LV vol d, MOD A2C:111.0 ml  +------------------+---------++ LV vol d, MOD A4C:95.2 ml   +------------------+---------++ LV vol s, MOD A2C:37.2 ml   +------------------+---------++ LV vol s, MOD A4C:34.9 ml   +------------------+---------++ LV SV MOD A2C:    73.8 ml   +------------------+---------++ LV SV MOD A4C:    95.2 ml   +------------------+---------++ LV SV MOD BP:     65.7 ml   +------------------+---------++  +---------------+----------++ RIGHT VENTRICLE           +---------------+----------++ RV S prime:    10.60 cm/s +---------------+----------++ TAPSE (M-mode):1.8 cm     +---------------+----------++  +---------------+-------++-----------++ LEFT ATRIUM           Index       +---------------+-------++-----------++ LA diam:       3.90 cm1.83 cm/m  +---------------+-------++-----------++ LA Vol (A2C):  64.5 ml30.27 ml/m +---------------+-------++-----------++ LA Vol (A4C):  57.2 ml26.84 ml/m +---------------+-------++-----------++ LA Biplane Vol:63.4 ml29.75 ml/m +---------------+-------++-----------++  +------------+---------++-----------++ RIGHT ATRIUM         Index       +------------+---------++-----------++ RA Area:    13.50 cm            +------------+---------++-----------++ RA Volume:  31.70 ml 14.88 ml/m +------------+---------++-----------++  +------------------+------------++ +--------------+--------++ AORTIC VALVE                   PULMONIC VALVE         +------------------+------------++ +--------------+--------++ AV Area (Vmax):   0.63 cm     PV Vmax:      0.69 m/s +------------------+------------++ +--------------+--------++ AV Area (Vmean):  0.63 cm     PV Peak grad: 1.9 mmHg +------------------+------------++ +--------------+--------++ AV Area (VTI):    0.65 cm     +------------------+------------++ AV Vmax:          478.00 cm/s  +------------------+------------++ AV Vmean:         364.000 cm/s +------------------+------------++ AV VTI:           1.450 m      +------------------+------------++ AV Peak Grad:     91.4 mmHg    +------------------+------------++ AV Mean Grad:     59.0 mmHg    +------------------+------------++ LVOT Vmax:        72.40 cm/s   +------------------+------------++ LVOT Vmean:       54.800 cm/s  +------------------+------------++ LVOT VTI:         0.227 m      +------------------+------------++ LVOT/AV VTI ratio:0.16         +------------------+------------++ AR PHT:           948 msec     +------------------+------------++   +-------------+-------++ AORTA                +-------------+-------++  Ao Root diam:4.10 cm +-------------+-------++  +--------------+-----------++ +---------------+-----------++ MITRAL VALVE              TRICUSPID VALVE            +--------------+-----------++ +---------------+-----------++ MV Area (PHT):1.78 cm    TR Peak grad:  21.3 mmHg   +--------------+-----------++  +---------------+-----------++ MV PHT:       123.83 msec TR Vmax:       231.00 cm/s +--------------+-----------++ +---------------+-----------++ MV Decel Time:427 msec    +--------------+-----------++ +--------------+-------+ +-------------+-----------++  SHUNTS                MR Peak grad:92.5 mmHg    +--------------+-------+ +-------------+-----------++  Systemic VTI: 0.23 m  MR Vmax:     481.00 cm/s  +--------------+-------+ +-------------+-----------++  Systemic Diam:2.30 cm +--------------+----------++  +--------------+-------+ MV E velocity:67.90 cm/s +--------------+----------++ MV A velocity:79.10 cm/s +--------------+----------++ MV E/A ratio: 0.86       +--------------+----------++  Cardiac Cath: Procedural Findings: Hemodynamics RA 5/7/6 mmHg RV 30/7-11 mmHg PA 33/15-22 mmHg PCWP 12 mmHg LV not measured. AoV not crossed AO 141/70-97 mmHg  Oxygen saturations: PA 70.2 AO 99.5  Cardiac Output (Fick) 4.17 Cardiac Index (Fick) 2.01    Pulmonary vascular resistance (PVR): 2.4 Woods units.  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 Severely calcified leaflets with restricted motion  Aorta: Shifted to right lung space moderate aortic root dilatation with moderate calcific atherosclerosis, normal arch vessels and moderate aortic root dilatation  Sinotubular Junction: 33 mm  Ascending Thoracic Aorta: 44 mm   Aortic Arch: 30 mm  Descending Thoracic Aorta: 26 mm  Sinus of Valsalva Measurements:  Non-coronary: 38.5 mm  Right - coronary: 40.8 mm  Left - coronary: 38 mm  Coronary Artery Height above Annulus:  Left Main: 17.6 mm above annulus  Right Coronary: 14 mm above annulus  Virtual Basal Annulus Measurements:  Maximum/Minimum Diameter: 30.2 mm x 21.8 mm elliptical  Perimeter: 84 mm  Area: Measured multiple times in 30% phase 522-526 mm2  Coronary Arteries: Sufficient height above annulus for deployment  Optimum Fluoroscopic Angle for Delivery:  AP  IMPRESSION: 1. 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 Annular area in 30% phase 522-526 mm2 suitable for a 26 mm Sapien 3 valve. STJ and sinuses suggest 29 mm valve may be possible but annulus is elliptical with small minor axis and area falls into the 26 mm range  2. Moderate aortic root enlargement 4.4 cm with shift to right lung space  3.  Coronary arteries sufficient height for deployment  4.  Optimum angle for deployment AP  CT Angio: FINDINGS: CTA CHEST FINDINGS  Cardiovascular: Heart size is mildly enlarged. There is no significant pericardial fluid, thickening or pericardial calcification. There is aortic atherosclerosis, as well as atherosclerosis of the great vessels of the mediastinum and the coronary arteries, including calcified atherosclerotic plaque in the left main, left anterior descending, left circumflex and right coronary arteries. Severe thickening calcification of the aortic valve. Ectasia of ascending thoracic aorta (4.4 cm in diameter).  Mediastinum/Lymph Nodes: No pathologically enlarged mediastinal or hilar lymph nodes. Esophagus is unremarkable in appearance. No axillary lymphadenopathy.  Lungs/Pleura: No suspicious appearing pulmonary nodules or masses are noted. No acute consolidative airspace disease. No pleural effusions.   Musculoskeletal/Soft Tissues: There are no aggressive appearing lytic or blastic lesions noted in the visualized portions of the skeleton.  CTA ABDOMEN AND PELVIS FINDINGS  Hepatobiliary: No suspicious cystic or solid hepatic lesions. No intra or extrahepatic biliary ductal dilatation. Gallbladder is normal in appearance.  Pancreas: No pancreatic mass. No pancreatic ductal dilatation. No pancreatic or peripancreatic fluid collections or inflammatory changes.  Spleen: Unremarkable.  Adrenals/Urinary Tract: Multiple low-attenuation lesions in both kidneys compatible with simple cysts, largest of which is exophytic in the lateral aspect of the upper pole of the right kidney measuring 4.5 cm in diameter. No suspicious renal lesions. Bilateral adrenal glands are normal in appearance. No hydroureteronephrosis. Urinary bladder is normal in appearance.  Stomach/Bowel: Normal appearance of the stomach. No pathologic dilatation of small bowel or colon. Normal appendix.  Vascular/Lymphatic: Aortic atherosclerosis, without evidence of aneurysm or dissection in the abdominal or pelvic vasculature. Vascular findings and measurements pertinent to potential TAVR procedure, as detailed below. Multiple prominent but nonenlarged lymph nodes in the inguinal regions bilaterally incidentally noted (nonspecific). No lymphadenopathy identified in the abdomen or pelvis.  Reproductive: Brachytherapy implants throughout the prostate gland. Prostate gland and seminal vesicles are otherwise unremarkable in appearance.  Other: No significant volume of ascites.  No pneumoperitoneum.  Musculoskeletal: Chronic compression fracture of L3 with 50% loss of central vertebral body height. There are no aggressive appearing lytic or blastic lesions noted in the visualized portions of the skeleton.  VASCULAR MEASUREMENTS PERTINENT TO TAVR:  AORTA:  Minimal Aortic Diameter- 17 x 17 mm   Severity of Aortic Calcification- mild  RIGHT PELVIS:  Right Common Iliac Artery -  Minimal Diameter-10.5 x 9.9 mm  Tortuosity-mild  Calcification-mild-to-moderate  Right External Iliac Artery -  Minimal Diameter-8.5 x 7.5 mm  Tortuosity-severe  Calcification-none  Right Common Femoral Artery -  Minimal Diameter-9.8 x 9.8 mm  Tortuosity-mild  Calcification-mild  LEFT PELVIS:  Left Common Iliac Artery -  Minimal Diameter-10.9 x 9.9 mm  Tortuosity-mild  Calcification-mild-to-moderate  Left External Iliac Artery -  Minimal Diameter-9.0 x 8.2 mm  Tortuosity-severe  Calcification-none  Left Common Femoral Artery -  Minimal Diameter-9.8 x 9.4 mm  Tortuosity-mild  Calcification-mild  Review of the MIP images confirms the above findings.  IMPRESSION: 1. Vascular findings and measurements pertinent to potential TAVR procedure, as detailed above. 2. Severe thickening calcification of the aortic valve, compatible with the reported clinical history of severe aortic stenosis. 3. Aortic atherosclerosis, in addition to left main and 3 vessel coronary artery disease. In addition, there is ectasia of ascending thoracic aorta (4.4 cm in diameter). Recommend annual imaging followup by CTA or MRA. This recommendation follows 2010 ACCF/AHA/AATS/ACR/ASA/SCA/SCAI/SIR/STS/SVM Guidelines for the Diagnosis and Management of Patients with Thoracic Aortic Disease. Circulation. 2010; [id number]. Aortic aneurysm NOS (ICD10-I71.9). 4. Additional incidental findings, as above.   Laboratory Data:  High Sensitivity Troponin:  No results for input(s): TROPONINIHS in the last 720 hours.    Chemistry Recent Labs  Lab 12/02/18 1152  NA 140  K 4.0  CL 108  CO2 24  GLUCOSE 91  BUN 14  CREATININE 1.05  CALCIUM 9.0  GFRNONAA >60  GFRAA >60  ANIONGAP 8    Recent Labs  Lab 12/02/18 1152  PROT 6.9  ALBUMIN 3.8  AST  18  ALT 12   ALKPHOS 59  BILITOT 0.6   Hematology Recent Labs  Lab 12/02/18 1152  WBC 5.3  RBC 4.30  HGB 14.0  HCT 41.1  MCV 95.6  MCH 32.6  MCHC 34.1  RDW 12.5  PLT 185   BNP Recent Labs  Lab 12/02/18 1152  BNP 202.1*    DDimer No results for input(s): DDIMER in the last 168 hours.   Radiology/Studies:  No results found.  Assessment and Plan:   1. Severe, stage D1 aortic stenosis with associated New York Heart Association functional class III symptoms of acute on chronic diastolic heart failure (elevated BNP 202).  The patient presents today for TAVR.  He has undergone extensive multidisciplinary heart team evaluation including CT angiography studies, echocardiogram, and right and left heart catheterization.  The patient has widely patent coronary arteries and CTA findings suitable for placement of a 26 mm transcatheter heart valve.  His CTA shows adequate pelvic vasculature for transfemoral access.  Risks, indications, and alternatives of TAVR have been reviewed at length with the patient.  He understands and agrees to proceed.  For questions or updates, please contact [facility] Please consult [email or web address] for contact info under    Signed, Sherren Mocha, MD  12/06/2018 8:09 AM

## 2018-12-06 NOTE — Progress Notes (Signed)
Pt arrived from PACU. Pt C/A/O4. Pt vital stable. CHG bath given. Telebox 11 applied/ccmd notified. Pt denies pain. Pt agrees to bedrest until 16:50. Will continue  monitor.  Jerald Kief, RN

## 2018-12-06 NOTE — Progress Notes (Signed)
  [facility] VALVE TEAM  Patient doing well s/p TAVR. He is hemodynamically stable. Groin sites stable. ECG shows marked sinus bradycardia with HR in high 30s but no high grade block. Arterial line discontinued and transferred to 4E. Plan for early ambulation after bedrest completed and hopeful discharge over the next 24-48 hours.   Angelena Form PA-C  [facility]  Pager [phone]

## 2018-12-07 ENCOUNTER — Ambulatory Visit (INDEPENDENT_AMBULATORY_CARE_PROVIDER_SITE_OTHER): Payer: Medicare HMO

## 2018-12-07 ENCOUNTER — Inpatient Hospital Stay (HOSPITAL_COMMUNITY): Payer: Medicare HMO

## 2018-12-07 ENCOUNTER — Encounter (HOSPITAL_COMMUNITY): Payer: Self-pay | Admitting: Cardiovascular Disease

## 2018-12-07 ENCOUNTER — Other Ambulatory Visit: Payer: Self-pay | Admitting: *Deleted

## 2018-12-07 DIAGNOSIS — R001 Bradycardia, unspecified: Secondary | ICD-10-CM

## 2018-12-07 DIAGNOSIS — Z87898 Personal history of other specified conditions: Secondary | ICD-10-CM | POA: Diagnosis not present

## 2018-12-07 DIAGNOSIS — Z952 Presence of prosthetic heart valve: Secondary | ICD-10-CM

## 2018-12-07 LAB — CBC
HCT: 35.9 % — ABNORMAL LOW (ref 39.0–52.0)
Hemoglobin: 12.9 g/dL — ABNORMAL LOW (ref 13.0–17.0)
MCH: 33.9 pg (ref 26.0–34.0)
MCHC: 35.9 g/dL (ref 30.0–36.0)
MCV: 94.2 fL (ref 80.0–100.0)
Platelets: 135 10*3/uL — ABNORMAL LOW (ref 150–400)
RBC: 3.81 MIL/uL — ABNORMAL LOW (ref 4.22–5.81)
RDW: 12.5 % (ref 11.5–15.5)
WBC: 6.6 10*3/uL (ref 4.0–10.5)
nRBC: 0 % (ref 0.0–0.2)

## 2018-12-07 LAB — BASIC METABOLIC PANEL
Anion gap: 8 (ref 5–15)
BUN: 13 mg/dL (ref 8–23)
CO2: 26 mmol/L (ref 22–32)
Calcium: 8.4 mg/dL — ABNORMAL LOW (ref 8.9–10.3)
Chloride: 105 mmol/L (ref 98–111)
Creatinine, Ser: 1.2 mg/dL (ref 0.61–1.24)
GFR calc Af Amer: >60 mL/min (ref >60)
GFR calc non Af Amer: 59 mL/min — ABNORMAL LOW (ref >60)
Glucose, Bld: 104 mg/dL — ABNORMAL HIGH (ref 70–99)
Potassium: 3.9 mmol/L (ref 3.5–5.1)
Sodium: 139 mmol/L (ref 135–145)

## 2018-12-07 LAB — ECHOCARDIOGRAM COMPLETE
Height: 70 in
Weight: 3213.42 oz

## 2018-12-07 LAB — MAGNESIUM: Magnesium: 1.8 mg/dL (ref 1.7–2.4)

## 2018-12-07 MED ORDER — ASPIRIN 81 MG PO TABS: 81.0000 mg | ORAL_TABLET | Freq: Every day | ORAL | 6 refills | Status: DC

## 2018-12-07 MED ORDER — CLOPIDOGREL BISULFATE 75 MG PO TABS: 75.0000 mg | ORAL_TABLET | Freq: Every day | ORAL | 1 refills | Status: DC

## 2018-12-07 NOTE — Anesthesia Postprocedure Evaluation (Signed)
Anesthesia Post Note  Patient: Gregory Black  Procedure(s) Performed: TRANSCATHETER AORTIC VALVE REPLACEMENT, TRANSFEMORAL approach with insertion of 40m ultra sapien edwards valve (N/A Chest) TRANSESOPHAGEAL ECHOCARDIOGRAM (TEE) (N/A )     Patient location during evaluation: PACU Anesthesia Type: MAC Level of consciousness: awake and alert Pain management: pain level controlled Vital Signs Assessment: post-procedure vital signs reviewed and stable Respiratory status: spontaneous breathing, nonlabored ventilation, respiratory function stable and patient connected to nasal cannula oxygen Cardiovascular status: stable and blood pressure returned to baseline Postop Assessment: no apparent nausea or vomiting Anesthetic complications: no    Last Vitals:  Vitals:   12/07/18 0420 12/07/18 0752  BP: 139/75 135/73  Pulse: (!) 58 64  Resp: 20 20  Temp: (!) 36.4 C 37.1 C  SpO2: 97% 97%    Last Pain:  Vitals:   12/07/18 0752  TempSrc: Oral  PainSc: 0-No pain                 Nolia Tschantz S

## 2018-12-07 NOTE — Progress Notes (Signed)
CARDIAC REHAB PHASE I   Pt ambulating independently. Pt denies CP or SOB. Pt with Zio patch, and aware of instructions. Encouraged continued ambulation. Reviewed restrictions and site care. Pt declining CRP II at this time.  [id number] Rufina Falco, RN BSN 12/07/2018 11:32 AM

## 2018-12-07 NOTE — Progress Notes (Signed)
Pt provided discharge instructions and education. Pt has zio patch in place for monitoring. IV's intact and removed. CCMD notified/telebox removed. vitals stable. Pt denies questions. Pt tx via wheelchair to valet to meet ride.  Jerald Kief, RN

## 2018-12-07 NOTE — Discharge Summary (Signed)
[facility] TEAM  Discharge Summary    Patient ID: Gregory Black MRN: [id number]; DOB: Aug 06, 1945  Admit date: 12/06/2018 Discharge date: 12/07/2018  Primary Care Provider: Kirk Ruths, MD  Primary Cardiologist: Dr. Ubaldo Glassing / Dr. Burt Knack & Dr. Roxy Manns (TAVR)  Discharge Diagnoses    Principal Problem:   S/P TAVR (transcatheter aortic valve replacement) Active Problems:   Severe aortic stenosis   Sinus bradycardia   Thoracic ascending aortic aneurysm (HCC)   GERD (gastroesophageal reflux disease)   Acute on chronic diastolic heart failure (HCC)   Allergies Allergies  Allergen Reactions   Vicodin [Hydrocodone-Acetaminophen] Other (See Comments)    Unable to void or empty bowels   Pollen Extract Other (See Comments)    Sneezing, runny eyes    Diagnostic Studies/Procedures    TAVR OPERATIVE NOTE   Date of Procedure:                12/06/2018  Preoperative Diagnosis:      Severe Aortic Stenosis   Postoperative Diagnosis:    Same   Procedure:        Transcatheter Aortic Valve Replacement - Percutaneous Right Transfemoral Approach             Edwards Sapien 3 Ultra THV (size 26 mm, model # 9750TFX, serial # [id number])              Co-Surgeons:                        Valentina Gu. Roxy Manns, MD and Sherren Mocha, MD  Anesthesiologist:                  Albertha Ghee, MD  Echocardiographer:              Jenkins Rouge, MD  Pre-operative Echo Findings: ? Severe aortic stenosis ? Normal left ventricular systolic function  Post-operative Echo Findings: ? No paravalvular leak ? Normal left ventricular systolic function  _____________   Echo 12/07/18: completed but pending formal read at the time of discharge    History of Present Illness     Gregory Black is a 73 y.o. male with a history of sinus bradycardia,prostate cancer, degenerative arthritis and severe AS who presented to [facility] on 12/06/18 for planned  TAVR.  Patient has known of presence of a heart murmur for more than 10 years.  He has been followed regularly by Dr. Ubaldo Glassing using serial echocardiograms that have documented the presence of aortic stenosis that has gradually progressed in severity. Patient has remained healthy and physically active until fairly recently when he began to experience episodes of exertional chest tightness and shortness of breath that only occur with moderate or strenuous physical activity.  In June the patient was working outdoors when he suffered a syncopal event.  He was taken to [facility] where troponin levels were minimally elevated.  Follow-up echocardiogram revealed normal left ventricular systolic function with severe aortic stenosis.  The patient has been followed since then by Dr. Ubaldo Glassing and underwent Holter monitor that revealed sinus bradycardia with no evidence for AV block or symptomatic bradycardia.  Diagnostic cardiac catheterization was performed demonstrating normal coronary artery anatomy with no significant coronary artery disease.   The patient has been evaluated by the multidisciplinary valve team and felt to have severe, symptomatic aortic stenosis and to be a suitable candidate for TAVR, which was set up for 12/06/18.   Hospital Course  Consultants: none   Severe AS: s/p successful TAVR with a 26 mm Edwards Sapien 3 THV via the TF approach on 12/06/18. Post operative echo pending. Groin sites are stable. ECG with NSR and no high grade heart block. Started on ASA and plavix.   Acute on chronic disatolic CHF: as evidenced by an elevated BNP on pre admission lab work. This has been treated with TAVR.   Sinus bradycardia: the patient has a long history of this and wore a holter monitor after an episode of syncope in June. The patient has had profound sinus bradycardia with a HR down into the 20s and up to 3.3 second sinus pauses on telemetry. HR now back into the 60s. Will plan to place a Zio patch prior to  discharge to rule out symptomatic bradycardia or significant pause at discharge.   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 The patient was offered SAVR with replacement of the proximal ascending thoracic aorta but he declined. Recommend annual imaging followup by CTA or MRA.  _____________  Discharge Vitals Blood pressure 135/73, pulse 64, temperature 98.7 F (37.1 C), temperature source Oral, resp. rate 20, height 5\' 10"  (1.778 m), weight 91.1 kg, SpO2 97 %.  Filed Weights   12/06/18 0921 12/07/18 0420  Weight: 90.9 kg 91.1 kg    PHYSICAL EXAM:    GEN: Well nourished, well developed, in no acute distress HEENT: normal Neck: no JVD or masses Cardiac: RRR; no murmurs, rubs, or gallops,no edema  Respiratory:  clear to auscultation bilaterally, normal work of breathing GI: soft, nontender, nondistended, + BS MS: no deformity or atrophy Skin: warm and dry, no rash.  Groin sites clear without hematoma or ecchymosis  Neuro:  Alert and Oriented x 3, Strength and sensation are intact Psych: euthymic mood, full affect   Labs & Radiologic Studies    CBC Recent Labs    12/06/18 1326 12/07/18 0224  WBC  --  6.6  HGB 11.6* 12.9*  HCT 34.0* 35.9*  MCV  --  94.2  PLT  --  [id number]*   Basic Metabolic Panel Recent Labs    12/06/18 1326 12/07/18 0224  NA 142 139  K 3.9 3.9  CL 105 105  CO2  --  26  GLUCOSE 97 104*  BUN 12 13  CREATININE 1.10 1.20  CALCIUM  --  8.4*  MG  --  1.8   Liver Function Tests No results for input(s): AST, ALT, ALKPHOS, BILITOT, PROT, ALBUMIN in the last 72 hours. No results for input(s): LIPASE, AMYLASE in the last 72 hours. Cardiac Enzymes No results for input(s): CKTOTAL, CKMB, CKMBINDEX, TROPONINI in the last 72 hours. BNP Invalid input(s): POCBNP D-Dimer No results for input(s): DDIMER in the last 72 hours. Hemoglobin A1C No results for input(s): HGBA1C in the last 72 hours. Fasting Lipid Panel No  results for input(s): CHOL, HDL, LDLCALC, TRIG, CHOLHDL, LDLDIRECT in the last 72 hours. Thyroid Function Tests No results for input(s): TSH, T4TOTAL, T3FREE, THYROIDAB in the last 72 hours.  Invalid input(s): FREET3 _____________  Dg Chest 2 View  Result Date: 12/02/2018 CLINICAL DATA:  Preop evaluation for aortic valve surgery EXAM: CHEST - 2 VIEW COMPARISON:  11/30/2018 FINDINGS: Cardiac shadow is at the upper limits of normal in size. No focal infiltrate is noted. No sizable effusion is seen. No bony abnormality is noted. IMPRESSION: No active cardiopulmonary disease. Electronically Signed   By: Linus Mako.D.  On: 12/02/2018 17:02   Ct Coronary Morph W/cta Cor W/score W/ca W/cm &/or Wo/cm  Addendum Date: 11/30/2018   ADDENDUM REPORT: 11/30/2018 12:04 CLINICAL DATA:  Aortic stenosis EXAM: Cardiac TAVR CT TECHNIQUE: The patient was scanned on a Siemens Force [id number] slice scanner. A 120 kV retrospective scan was triggered in the descending thoracic aorta at 111 HU's. Gantry rotation speed was 270 msecs and collimation was .9 mm. No beta blockade or nitro were given. The 3D data set was reconstructed in 5% intervals of the R-R cycle. Systolic and diastolic phases were analyzed on a dedicated work station using MPR, MIP and VRT modes. The patient received 80 cc of contrast. 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 Severely calcified leaflets with restricted motion Aorta: Shifted to right lung space moderate aortic root dilatation with moderate calcific atherosclerosis, normal arch vessels and moderate aortic root dilatation Sinotubular Junction: 33 mm Ascending Thoracic Aorta: 44 mm Aortic Arch: 30 mm Descending Thoracic Aorta: 26 mm Sinus of Valsalva Measurements: Non-coronary: 38.5 mm Right - coronary: 40.8 mm Left - coronary: 38 mm Coronary Artery Height above Annulus: Left Main: 17.6 mm above annulus Right Coronary: 14 mm above annulus Virtual Basal Annulus  Measurements: Maximum/Minimum Diameter: 30.2 mm x 21.8 mm elliptical Perimeter: 84 mm Area: Measured multiple times in 30% phase 522-526 mm2 Coronary Arteries: Sufficient height above annulus for deployment Optimum Fluoroscopic Angle for Delivery:  AP IMPRESSION: 1. 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 Annular area in 30% phase 522-526 mm2 suitable for a 26 mm Sapien 3 valve. STJ and sinuses suggest 29 mm valve may be possible but annulus is elliptical with small minor axis and area falls into the 26 mm range 2. Moderate aortic root enlargement 4.4 cm with shift to right lung space 3.  Coronary arteries sufficient height for deployment 4.  Optimum angle for deployment AP Jenkins Rouge Electronically Signed   By: Jenkins Rouge M.D.   On: 11/30/2018 12:04   Result Date: 11/30/2018 EXAM: OVER-READ INTERPRETATION  CT CHEST The following report is an over-read performed by radiologist Dr. Vinnie Langton of [facility], [place] on 11/30/2018. This over-read does not include interpretation of cardiac or coronary anatomy or pathology. The coronary calcium score/coronary CTA interpretation by the cardiologist is attached. COMPARISON:  None. FINDINGS: Extracardiac findings will be described separately under dictation for contemporaneously obtained CTA chest, abdomen and pelvis. IMPRESSION: Please see separate dictation for contemporaneously obtained CTA chest, abdomen and pelvis dated 11/30/2018 for full description of relevant extracardiac findings. Electronically Signed: By: Vinnie Langton M.D. On: 11/30/2018 10:59   Dg Chest Port 1 View  Result Date: 12/06/2018 CLINICAL DATA:  Status post TAVR, check support apparatus EXAM: PORTABLE CHEST 1 VIEW COMPARISON:  12/02/2018 FINDINGS: Cardiomegaly with interval placement of aortic valve stent endograft. No acute abnormality of the lungs. No support apparatus internal to the patient per stated indication. IMPRESSION: Cardiomegaly with interval  placement of aortic valve stent endograft. No acute abnormality of the lungs. No support apparatus internal to the patient per stated indication. Electronically Signed   By: Eddie Candle M.D.   On: 12/06/2018 16:09   Ct Angio Chest Aorta W &/or Wo Contrast  Result Date: 11/30/2018 CLINICAL DATA:  73 year old male with history of severe aortic valve disease. Preoperative study prior to potential transcatheter aortic valve replacement (TAVR) procedure. EXAM: CT ANGIOGRAPHY CHEST, ABDOMEN AND PELVIS TECHNIQUE: Multidetector CT imaging through the chest, abdomen and pelvis was performed using  the standard protocol during bolus administration of intravenous contrast. Multiplanar reconstructed images and MIPs were obtained and reviewed to evaluate the vascular anatomy. CONTRAST:  47mL OMNIPAQUE IOHEXOL 350 MG/ML SOLN COMPARISON:  No priors. FINDINGS: CTA CHEST FINDINGS Cardiovascular: Heart size is mildly enlarged. There is no significant pericardial fluid, thickening or pericardial calcification. There is aortic atherosclerosis, as well as atherosclerosis of the great vessels of the mediastinum and the coronary arteries, including calcified atherosclerotic plaque in the left main, left anterior descending, left circumflex and right coronary arteries. Severe thickening calcification of the aortic valve. Ectasia of ascending thoracic aorta (4.4 cm in diameter). Mediastinum/Lymph Nodes: No pathologically enlarged mediastinal or hilar lymph nodes. Esophagus is unremarkable in appearance. No axillary lymphadenopathy. Lungs/Pleura: No suspicious appearing pulmonary nodules or masses are noted. No acute consolidative airspace disease. No pleural effusions. Musculoskeletal/Soft Tissues: There are no aggressive appearing lytic or blastic lesions noted in the visualized portions of the skeleton. CTA ABDOMEN AND PELVIS FINDINGS Hepatobiliary: No suspicious cystic or solid hepatic lesions. No intra or extrahepatic biliary ductal  dilatation. Gallbladder is normal in appearance. Pancreas: No pancreatic mass. No pancreatic ductal dilatation. No pancreatic or peripancreatic fluid collections or inflammatory changes. Spleen: Unremarkable. Adrenals/Urinary Tract: Multiple low-attenuation lesions in both kidneys compatible with simple cysts, largest of which is exophytic in the lateral aspect of the upper pole of the right kidney measuring 4.5 cm in diameter. No suspicious renal lesions. Bilateral adrenal glands are normal in appearance. No hydroureteronephrosis. Urinary bladder is normal in appearance. Stomach/Bowel: Normal appearance of the stomach. No pathologic dilatation of small bowel or colon. Normal appendix. Vascular/Lymphatic: Aortic atherosclerosis, without evidence of aneurysm or dissection in the abdominal or pelvic vasculature. Vascular findings and measurements pertinent to potential TAVR procedure, as detailed below. Multiple prominent but nonenlarged lymph nodes in the inguinal regions bilaterally incidentally noted (nonspecific). No lymphadenopathy identified in the abdomen or pelvis. Reproductive: Brachytherapy implants throughout the prostate gland. Prostate gland and seminal vesicles are otherwise unremarkable in appearance. Other: No significant volume of ascites.  No pneumoperitoneum. Musculoskeletal: Chronic compression fracture of L3 with 50% loss of central vertebral body height. There are no aggressive appearing lytic or blastic lesions noted in the visualized portions of the skeleton. VASCULAR MEASUREMENTS PERTINENT TO TAVR: AORTA: Minimal Aortic Diameter- 17 x 17 mm Severity of Aortic Calcification- mild RIGHT PELVIS: Right Common Iliac Artery - Minimal Diameter-10.5 x 9.9 mm Tortuosity-mild Calcification-mild-to-moderate Right External Iliac Artery - Minimal Diameter-8.5 x 7.5 mm Tortuosity-severe Calcification-none Right Common Femoral Artery - Minimal Diameter-9.8 x 9.8 mm Tortuosity-mild Calcification-mild LEFT  PELVIS: Left Common Iliac Artery - Minimal Diameter-10.9 x 9.9 mm Tortuosity-mild Calcification-mild-to-moderate Left External Iliac Artery - Minimal Diameter-9.0 x 8.2 mm Tortuosity-severe Calcification-none Left Common Femoral Artery - Minimal Diameter-9.8 x 9.4 mm Tortuosity-mild Calcification-mild Review of the MIP images confirms the above findings. IMPRESSION: 1. Vascular findings and measurements pertinent to potential TAVR procedure, as detailed above. 2. Severe thickening calcification of the aortic valve, compatible with the reported clinical history of severe aortic stenosis. 3. Aortic atherosclerosis, in addition to left main and 3 vessel coronary artery disease. In addition, there is ectasia of ascending thoracic aorta (4.4 cm in diameter). Recommend annual imaging followup by CTA or MRA. This recommendation follows 2010 ACCF/AHA/AATS/ACR/ASA/SCA/SCAI/SIR/STS/SVM Guidelines for the Diagnosis and Management of Patients with Thoracic Aortic Disease. Circulation. 2010; [id number]. Aortic aneurysm NOS (ICD10-I71.9). 4. Additional incidental findings, as above. Electronically Signed   By: Vinnie Langton M.D.   On: 11/30/2018 14:10  Vas US Carotid  Result Date: 11/30/2018 Carotid Arterial Duplex Study Other Factors:     Pre TAVR severe AS. Comparison Study:  No previous study available Performing Technologist: Toma Copier RVS  Examination Guidelines: A complete evaluation includes B-mode imaging, spectral Doppler, color Doppler, and power Doppler as needed of all accessible portions of each vessel. Bilateral testing is considered an integral part of a complete examination. Limited examinations for reoccurring indications may be performed as noted.  Right Carotid Findings: +----------+-------+--------+--------+----------------+-----------------------+             PSV     EDV cm/s Stenosis Plaque           Comments                             cm/s                      Description                                +----------+-------+--------+--------+----------------+-----------------------+  CCA Prox   77      17                                                          +----------+-------+--------+--------+----------------+-----------------------+  CCA Distal 75      19                                 mild intimal wall                                                               changes                  +----------+-------+--------+--------+----------------+-----------------------+  ICA Prox   71      16                heterogenous     minimal plaque           +----------+-------+--------+--------+----------------+-----------------------+  ICA Mid    60      21                                                          +----------+-------+--------+--------+----------------+-----------------------+  ICA Distal 88      21                                 tortuous                 +----------+-------+--------+--------+----------------+-----------------------+  ECA        73      13                                                          +----------+-------+--------+--------+----------------+-----------------------+ +----------+--------+-------+--------+-------------------+  PSV cm/s EDV cms Describe Arm Pressure (mmHG)  +----------+--------+-------+--------+-------------------+  Subclavian 122                                            +----------+--------+-------+--------+-------------------+ +---------+--------+--+--------+-+  Vertebral PSV cm/s 53 EDV cm/s 8  +---------+--------+--+--------+-+  Left Carotid Findings: +----------+--------+--------+--------+------------------+---------------------+             PSV cm/s EDV cm/s Stenosis Plaque Description Comments               +----------+--------+--------+--------+------------------+---------------------+  CCA Prox   89       21                heterogenous       minimal plaque in the                                                            prox to  mid region     +----------+--------+--------+--------+------------------+---------------------+  CCA Distal 91       30                                   mild intimal wall                                                                changes                +----------+--------+--------+--------+------------------+---------------------+  ICA Prox   94       26                heterogenous       mild plaque            +----------+--------+--------+--------+------------------+---------------------+  ICA Mid    78       27                                   tortuous               +----------+--------+--------+--------+------------------+---------------------+  ICA Distal 42       7                                    tortuous               +----------+--------+--------+--------+------------------+---------------------+  ECA        45       6                                    mild ntimal wall  changes                +----------+--------+--------+--------+------------------+---------------------+ +----------+--------+--------+--------+-------------------+             PSV cm/s EDV cm/s Describe Arm Pressure (mmHG)  +----------+--------+--------+--------+-------------------+  Subclavian 133                                             +----------+--------+--------+--------+-------------------+ +---------+--------+--+--------+-+  Vertebral PSV cm/s 55 EDV cm/s 9  +---------+--------+--+--------+-+  Summary: Right Carotid: Velocities in the right ICA are consistent with a 1-39% stenosis. Left Carotid: Velocities in the left ICA are consistent with a 1-39% stenosis. Vertebrals:  Bilateral vertebral arteries demonstrate antegrade flow. Subclavians: Normal flow hemodynamics were seen in bilateral subclavian              arteries. *See table(s) above for measurements and observations.  Electronically signed by Curt Jews MD on 11/30/2018 at 4:52:03 PM.    Final    Ct  Angio Abd/pel W/ And/or W/o  Result Date: 11/30/2018 CLINICAL DATA:  73 year old male with history of severe aortic valve disease. Preoperative study prior to potential transcatheter aortic valve replacement (TAVR) procedure. EXAM: CT ANGIOGRAPHY CHEST, ABDOMEN AND PELVIS TECHNIQUE: Multidetector CT imaging through the chest, abdomen and pelvis was performed using the standard protocol during bolus administration of intravenous contrast. Multiplanar reconstructed images and MIPs were obtained and reviewed to evaluate the vascular anatomy. CONTRAST:  64mL OMNIPAQUE IOHEXOL 350 MG/ML SOLN COMPARISON:  No priors. FINDINGS: CTA CHEST FINDINGS Cardiovascular: Heart size is mildly enlarged. There is no significant pericardial fluid, thickening or pericardial calcification. There is aortic atherosclerosis, as well as atherosclerosis of the great vessels of the mediastinum and the coronary arteries, including calcified atherosclerotic plaque in the left main, left anterior descending, left circumflex and right coronary arteries. Severe thickening calcification of the aortic valve. Ectasia of ascending thoracic aorta (4.4 cm in diameter). Mediastinum/Lymph Nodes: No pathologically enlarged mediastinal or hilar lymph nodes. Esophagus is unremarkable in appearance. No axillary lymphadenopathy. Lungs/Pleura: No suspicious appearing pulmonary nodules or masses are noted. No acute consolidative airspace disease. No pleural effusions. Musculoskeletal/Soft Tissues: There are no aggressive appearing lytic or blastic lesions noted in the visualized portions of the skeleton. CTA ABDOMEN AND PELVIS FINDINGS Hepatobiliary: No suspicious cystic or solid hepatic lesions. No intra or extrahepatic biliary ductal dilatation. Gallbladder is normal in appearance. Pancreas: No pancreatic mass. No pancreatic ductal dilatation. No pancreatic or peripancreatic fluid collections or inflammatory changes. Spleen: Unremarkable. Adrenals/Urinary  Tract: Multiple low-attenuation lesions in both kidneys compatible with simple cysts, largest of which is exophytic in the lateral aspect of the upper pole of the right kidney measuring 4.5 cm in diameter. No suspicious renal lesions. Bilateral adrenal glands are normal in appearance. No hydroureteronephrosis. Urinary bladder is normal in appearance. Stomach/Bowel: Normal appearance of the stomach. No pathologic dilatation of small bowel or colon. Normal appendix. Vascular/Lymphatic: Aortic atherosclerosis, without evidence of aneurysm or dissection in the abdominal or pelvic vasculature. Vascular findings and measurements pertinent to potential TAVR procedure, as detailed below. Multiple prominent but nonenlarged lymph nodes in the inguinal regions bilaterally incidentally noted (nonspecific). No lymphadenopathy identified in the abdomen or pelvis. Reproductive: Brachytherapy implants throughout the prostate gland. Prostate gland and seminal vesicles are otherwise unremarkable in appearance. Other: No significant volume of ascites.  No pneumoperitoneum. Musculoskeletal: Chronic compression fracture of L3 with 50% loss of central  vertebral body height. There are no aggressive appearing lytic or blastic lesions noted in the visualized portions of the skeleton. VASCULAR MEASUREMENTS PERTINENT TO TAVR: AORTA: Minimal Aortic Diameter- 17 x 17 mm Severity of Aortic Calcification- mild RIGHT PELVIS: Right Common Iliac Artery - Minimal Diameter-10.5 x 9.9 mm Tortuosity-mild Calcification-mild-to-moderate Right External Iliac Artery - Minimal Diameter-8.5 x 7.5 mm Tortuosity-severe Calcification-none Right Common Femoral Artery - Minimal Diameter-9.8 x 9.8 mm Tortuosity-mild Calcification-mild LEFT PELVIS: Left Common Iliac Artery - Minimal Diameter-10.9 x 9.9 mm Tortuosity-mild Calcification-mild-to-moderate Left External Iliac Artery - Minimal Diameter-9.0 x 8.2 mm Tortuosity-severe Calcification-none Left Common Femoral  Artery - Minimal Diameter-9.8 x 9.4 mm Tortuosity-mild Calcification-mild Review of the MIP images confirms the above findings. IMPRESSION: 1. Vascular findings and measurements pertinent to potential TAVR procedure, as detailed above. 2. Severe thickening calcification of the aortic valve, compatible with the reported clinical history of severe aortic stenosis. 3. Aortic atherosclerosis, in addition to left main and 3 vessel coronary artery disease. In addition, there is ectasia of ascending thoracic aorta (4.4 cm in diameter). Recommend annual imaging followup by CTA or MRA. This recommendation follows 2010 ACCF/AHA/AATS/ACR/ASA/SCA/SCAI/SIR/STS/SVM Guidelines for the Diagnosis and Management of Patients with Thoracic Aortic Disease. Circulation. 2010; [id number]. Aortic aneurysm NOS (ICD10-I71.9). 4. Additional incidental findings, as above. Electronically Signed   By: Vinnie Langton M.D.   On: 11/30/2018 14:10   Disposition   Pt is being discharged home today in good condition.  Follow-up Plans & Appointments    Follow-up Information    Eileen Stanford, PA-C. Go on 12/14/2018.   Specialties: Cardiology, Radiology Why: @ 3pm, please arrive at least 10 minutes early.  Contact information: [address] [place] [address] [phone]            Discharge Medications   Allergies as of 12/07/2018      Reactions   Vicodin [hydrocodone-acetaminophen] Other (See Comments)   Unable to void or empty bowels   Pollen Extract Other (See Comments)   Sneezing, runny eyes      Medication List    TAKE these medications   aspirin 81 MG tablet Take 1 tablet (81 mg total) by mouth daily. What changed: when to take this   clopidogrel 75 MG tablet Commonly known as: PLAVIX Take 1 tablet (75 mg total) by mouth daily with breakfast. Start taking on: December 08, 2018   Fish Oil 1000 MG Caps Take 1,000 mg by mouth daily.   multivitamin with minerals Tabs  tablet Take 1 tablet by mouth daily.   vitamin B-12 1000 MCG tablet Commonly known as: CYANOCOBALAMIN Take 1,000 mcg by mouth daily.           Outstanding Labs/Studies   None   Duration of Discharge Encounter   Greater than 30 minutes including physician time.  SignedAngelena Form, PA-C 12/07/2018, 10:33 AM [phone]

## 2018-12-07 NOTE — Progress Notes (Signed)
  Echocardiogram 2D Echocardiogram has been performed.  Gregory Black 12/07/2018, 9:44 AM

## 2018-12-08 ENCOUNTER — Telehealth: Payer: Self-pay | Admitting: Physician Assistant

## 2018-12-08 ENCOUNTER — Telehealth: Payer: Self-pay | Admitting: Cardiovascular Disease

## 2018-12-08 LAB — POCT I-STAT, CHEM 8
BUN: 6 mg/dL — ABNORMAL LOW (ref 8–23)
Calcium, Ion: 0.82 mmol/L — CL (ref 1.15–1.40)
Chloride: 119 mmol/L — ABNORMAL HIGH (ref 98–111)
Creatinine, Ser: 0.4 mg/dL — ABNORMAL LOW (ref 0.61–1.24)
Glucose, Bld: 61 mg/dL — ABNORMAL LOW (ref 70–99)
HCT: 19 % — ABNORMAL LOW (ref 39.0–52.0)
Hemoglobin: 6.5 g/dL — CL (ref 13.0–17.0)
Potassium: 2 mmol/L — CL (ref 3.5–5.1)
Sodium: 151 mmol/L — ABNORMAL HIGH (ref 135–145)
TCO2: 16 mmol/L — ABNORMAL LOW (ref 22–32)

## 2018-12-08 MED FILL — Potassium Chloride Inj 2 mEq/ML: INTRAVENOUS | Qty: 40 | Status: AC

## 2018-12-08 MED FILL — Magnesium Sulfate Inj 50%: INTRAMUSCULAR | Qty: 10 | Status: AC

## 2018-12-08 MED FILL — Heparin Sodium (Porcine) Inj 1000 Unit/ML: INTRAMUSCULAR | Qty: 30 | Status: AC

## 2018-12-08 NOTE — Telephone Encounter (Signed)
New message:       Patient calling stating that his monitor is not working. Please call patient back.

## 2018-12-08 NOTE — Telephone Encounter (Signed)
Patient thought monitor had been turned off and he was supposed to turn it back on this morning.  Assured patient that if no lights are blinking, monitor should be up and running.  Patient given customer service number at [facility].  They will be able to confirm if his Live monitor is transmitting,

## 2018-12-08 NOTE — Telephone Encounter (Signed)
  [place] VALVE TEAM   Patient contacted regarding discharge from [facility] on 9/17  Patient understands to follow up with provider Nell Range on 9/23 at [facility].  Patient understands discharge instructions? yes Patient understands medications and regimen? yes Patient understands to bring all medications to this visit? Yes  Patient having what sounds like some orthopnea. No swelling in legs. I offered to call him him in some lasix but he would like to see how he does overnight. If he has more issues he will call me back in the AM.   Angelena Form PA-C  [facility]

## 2018-12-09 ENCOUNTER — Ambulatory Visit: Payer: Medicare HMO | Admitting: Radiation Oncology

## 2018-12-09 NOTE — Telephone Encounter (Signed)
Follow up   Patient states that he has more questions per the previous message. Please call.

## 2018-12-09 NOTE — Telephone Encounter (Signed)
The patient wants Gregory Black to know he slept with one pillow last night and could breathe fine. The issue he had was when he tried to sleep with 2 pillows and his chin was pushed to his chest, making it uncomfortable to breathe.  Instructed the patient to call if he has any issues prior to visit next week. He was grateful for assistance.

## 2018-12-09 NOTE — Telephone Encounter (Signed)
Follow up   Please call the patient.

## 2018-12-12 NOTE — Progress Notes (Signed)
[facility] [place]                                       Cardiology Office Note    Date:  12/14/2018   ID:  Gregory, Messenger Mar Black, Gregory Black, MRN [id number]  PCP:  Kirk Ruths, MD  Cardiologist: Dr. Ubaldo Glassing / Dr. Burt Knack & Dr. Roxy Manns (TAVR)  CC: [facility] s/p TAVR  History of Present Illness:  Gregory Black is a 73 y.o. male with a history of sinus bradycardia,prostate cancer, degenerative arthritis and severe AS s/p TAVR (9/15/Black) who presents to clinic for follow up.   Patient has known of presence of a heart murmur for more than 10 years. He has been followed regularly by Dr. Duanne Guess serial echocardiograms that have documented the presence of aortic stenosis that has gradually progressed in severity. Patient has remained healthy and physically active until fairly recently when he began to experience episodes of exertional chest tightness and shortness of breath that only occur with moderate or strenuous physical activity. In June the patient was working outdoors when he suffered a syncopal event. He was taken to [facility] where troponin levels were minimally elevated. Follow-up echocardiogram revealed normal left ventricular systolic function with severe aortic stenosis. The patient has been followed since then by Dr. Ubaldo Glassing and underwent Holter monitor that revealed sinus bradycardia with no evidence for AV block or symptomatic bradycardia. Diagnostic cardiac catheterization was performed demonstrating normal coronary artery anatomy with no significant coronary artery disease.   The paitient was evaluated by the multidisciplinary valve team and underwent successful TAVR with a 26 mm Edwards Sapien 3 THV via the TF approach on 9/15/Black. He has profound post op sinus bradycardia with HRs in the 20s with up to 3.3 second pauses. Post operative echo showed EF 60-65% with normally functioning TAVR with elevated mean gradient of 22 mmHg and trivial  PVL. His hospital course was uncomplicated and he was discharged on POD1 on aspirin and plavix as well as a zio patch to monitor his HR.  Today he presents to clinic for follow up. No CP or SOB. He has some new LE edema and bendopnea but no orthopnea or PND. No dizziness or syncope. No blood in stool or urine. No palpitations. 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     Past Medical History:  Diagnosis Date   Arthritis    hands, knees   Bulging lumbar disc    GERD (gastroesophageal reflux disease)    History of hiatal hernia    History of prostate cancer    Neuropathy    S/P TAVR (transcatheter aortic valve replacement) 12/06/2018   a. 9/15/Black: s/p TAVR with a 26 mm Edwards S3U THV via the TF approach   Severe aortic stenosis    Sinus bradycardia    Thoracic ascending aortic aneurysm ([place])    4.4 cm 9/9/Black CTA   Wears dentures    full upper and lower    Past Surgical History:  Procedure Laterality Date   CARDIAC CATHETERIZATION     No significant CAD 10/2018   CATARACT EXTRACTION Left 2015   COLONOSCOPY     COLONOSCOPY WITH PROPOFOL N/A 05/17/2015   Procedure: COLONOSCOPY WITH PROPOFOL;  Surgeon: Lucilla Lame, MD;  Location: [facility];  Service: Endoscopy;  Laterality: N/A;  EYE SURGERY     KNEE ARTHROSCOPY W/ MENISCAL REPAIR Right    RADIOACTIVE SEED IMPLANT N/A 07/26/2017   Procedure: RADIOACTIVE SEED IMPLANT/BRACHYTHERAPY IMPLANT;  Surgeon: Hollice Espy, MD;  Location: [facility];  Service: Urology;  Laterality: N/A;   RIGHT/LEFT HEART CATH AND CORONARY ANGIOGRAPHY Bilateral 11/15/2018   Procedure: RIGHT/LEFT HEART CATH AND CORONARY ANGIOGRAPHY;  Surgeon: Teodoro Spray, MD;  Location: [facility] [place] [facility];  Service: Cardiovascular;  Laterality: Bilateral;   TEE WITHOUT CARDIOVERSION N/A 12/06/2018   Procedure: TRANSESOPHAGEAL ECHOCARDIOGRAM (TEE);  Surgeon: Sherren Mocha, MD;  Location: [facility];   Service: Open Heart Surgery;  Laterality: N/A;   TONSILLECTOMY     TONSILLECTOMY AND ADENOIDECTOMY     TRANSCATHETER AORTIC VALVE REPLACEMENT, TRANSFEMORAL N/A 12/06/2018   Procedure: TRANSCATHETER AORTIC VALVE REPLACEMENT, TRANSFEMORAL approach with insertion of 104mm ultra sapien edwards valve;  Surgeon: Sherren Mocha, MD;  Location: [place];  Service: Open Heart Surgery;  Laterality: N/A;    Current Medications: Outpatient Medications Prior to Visit  Medication Sig Dispense Refill   aspirin 81 MG tablet Take 1 tablet (81 mg total) by mouth daily. 90 tablet 6   clopidogrel (PLAVIX) 75 MG tablet Take 1 tablet (75 mg total) by mouth daily with breakfast. 90 tablet 1   Multiple Vitamin (MULTIVITAMIN WITH MINERALS) TABS tablet Take 1 tablet by mouth daily.     Omega-3 Fatty Acids (FISH OIL) 1000 MG CAPS Take 1,000 mg by mouth daily.     vitamin B-12 (CYANOCOBALAMIN) 1000 MCG tablet Take 1,000 mcg by mouth daily.     Facility-Administered Medications Prior to Visit  Medication Dose Route Frequency Provider Last Rate Last Dose   sodium chloride flush (NS) 0.9 % injection 3 mL  3 mL Intravenous Q12H Ubaldo Glassing, Javier Docker, MD         Allergies:   Vicodin [hydrocodone-acetaminophen] and Pollen extract   Social History   Socioeconomic History   Marital status: Married    Spouse name: Not on file   Number of children: Not on file   Years of education: Not on file   Highest education level: Not on file  Occupational History   Not on file  Social Needs   Financial resource strain: Not on file   Food insecurity    Worry: Not on file    Inability: Not on file   Transportation needs    Medical: Not on file    Non-medical: Not on file  Tobacco Use   Smoking status: Former Smoker    Quit date: 12/02/1998    Years since quitting: Black.0   Smokeless tobacco: Never Used   Tobacco comment: quit approx 2010  Substance and Sexual Activity   Alcohol use: Yes    Alcohol/week: 1.0  standard drinks    Types: 1 Cans of beer per week    Comment: 1 beer a week   Drug use: No   Sexual activity: Not on file  Lifestyle   Physical activity    Days per week: Not on file    Minutes per session: Not on file   Stress: Not on file  Relationships   Social connections    Talks on phone: Not on file    Gets together: Not on file    Attends religious service: Not on file    Active member of club or organization: Not on file    Attends meetings of clubs or organizations: Not on file    Relationship status: Not on file  Other Topics Concern   Not on file  Social History Narrative   Not on file     Family History:  The patient'sfamily history includes Arthritis in his father; Cancer in his brother and father; Diabetes in his brother and mother; Heart disease in his mother; Hypertension in his mother; Lung disease in his brother; Stroke in his mother.     ROS:   Please see the history of present illness.    ROS All other systems reviewed and are negative.   PHYSICAL EXAM:   VS:  BP 136/78    Pulse (!) 51    Ht 5\' 9"  (1.753 m)    Wt 201 lb 1.9 oz (91.2 kg)    BMI 29.70 kg/m    GEN: Well nourished, well developed, in no acute distress HEENT: normal Neck: no JVD or masses Cardiac: RRR; no murmurs, rubs, or gallops. 1 + bilateral LE edema R>L Respiratory:  clear to auscultation bilaterally, normal work of breathing GI: soft, nontender, nondistended, + BS MS: no deformity or atrophy Skin: warm and dry, no rash Neuro:  Alert and Oriented x 3, Strength and sensation are intact Psych: euthymic mood, full affect   Wt Readings from Last 3 Encounters:  09/23/Black 201 lb 1.9 oz (91.2 kg)  09/16/Black 200 lb 13.4 oz (91.1 kg)  09/11/Black 200 lb 8 oz (90.9 kg)      Studies/Labs Reviewed:   EKG:  EKG is ordered today.  The ekg ordered today demonstrates sinus brady with 1st deg AV block , HR 51.   Recent Labs: 12/02/2018: ALT 12; B Natriuretic Peptide 202.1 12/07/2018: BUN  13; Creatinine, Ser 1.Black; Hemoglobin 12.9; Magnesium 1.8; Platelets 135; Potassium 3.9; Sodium 139   Lipid Panel    Component Value Date/Time   CHOL 179 09/11/2018 0622   CHOL 185 04/03/2015 1449   TRIG 124 09/11/2018 0622   HDL 38 (L) 09/11/2018 0622   HDL 52 04/03/2015 1449   CHOLHDL 4.7 09/11/2018 0622   VLDL 25 09/11/2018 0622   LDLCALC 116 (H) 09/11/2018 0622   LDLCALC 120 (H) 04/03/2015 1449    Additional studies/ records that were reviewed today include:  TAVR OPERATIVE NOTE   Date of Procedure:12/06/2018  Preoperative Diagnosis:Severe Aortic Stenosis   Postoperative Diagnosis:Same   Procedure:   Transcatheter Aortic Valve Replacement - PercutaneousRightTransfemoral Approach Edwards Sapien 3 Ultra THV (size 63mm, model # 9750TFX, serial # [id number])  Co-Surgeons:Clarence H. Roxy Manns, MD and Sherren Mocha, MD  Anesthesiologist:Adam Marcie Bal, MD  Echocardiographer:Peter Johnsie Cancel, MD  Pre-operative Echo Findings: ? Severe aortic stenosis ? Normalleft ventricular systolic function  Post-operative Echo Findings: ? Noparavalvular leak ? Normalleft ventricular systolic function  _____________   Echo 9/16/Black: IMPRESSIONS  1. The left ventricle has normal systolic function, with an ejection fraction of 60-65%. The cavity size was normal. There is moderately increased left ventricular wall thickness.  2. The right ventricle has normal systolc function. The cavity was normal. There is no increase in right ventricular wall thickness. Right ventricular systolic pressure cannot be assessed due to insufficient tricuspid regurgitation.  3. No evidence of mitral valve stenosis.  4. A 26 an Edwards Edwards Sapien bioprosthetic aortic valve (TAVR) valve is present in the aortic position. Procedure Date: 09/15/Black Normal aortic valve prosthesis.  5. Aortic  valve regurgitation is trivial by color flow Doppler. Moderate stenosis of the aortic valve.  6. Trivial perivalvular regurgitation. Mean aortic valve gradient moderately elevated (62mmHg).  7. Pulmonic valve regurgitation was not assessed by color  flow Doppler.   ASSESSMENT & PLAN:   Severe AS s/p TAVR: groin sites are healing well. ECG with no HAVB. SBE prophylaxis discussed; the patient is edentulous and does not go to the dentist. Continue on aspirin and plavix. I will see him back next month for echo and follow up.   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 Will start lasix 20mg  daily and Kdur 10 meq daily. He may not need this long term, but I will keep him on this until I see him back with a BMET.   Sinus bradycardia: HR is in the 50s today with 1 st deg AV block. Has a zio patch in place.   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 The patient was offered SAVR with replacement of the proximal ascending thoracic aorta but he declined. Recommend annual imaging followup by CTA or MRA. Due 11/2019.   Medication Adjustments/Labs and Tests Ordered: Current medicines are reviewed at length with the patient today.  Concerns regarding medicines are outlined above.  Medication changes, Labs and Tests ordered today are listed in the Patient Instructions below. Patient Instructions  Medication Instructions:  1) START LASIX Black mg daily 2) START POTASSIUM (Kdur) 10 meq daily  Follow-Up: Please keep your upcoming visits as scheduled on 10/15!    Signed, Angelena Form, PA-C  12/14/2018 3:49 PM    [facility] [place], [place], [place]  [address] Phone: [phone]; Fax: [phone]

## 2018-12-14 ENCOUNTER — Encounter: Payer: Self-pay | Admitting: Physician Assistant

## 2018-12-14 ENCOUNTER — Other Ambulatory Visit: Payer: Self-pay

## 2018-12-14 ENCOUNTER — Ambulatory Visit: Payer: Medicare HMO | Admitting: Physician Assistant

## 2018-12-14 VITALS — BP 136/78 | HR 51 | Ht 69.0 in | Wt 201.1 lb

## 2018-12-14 DIAGNOSIS — I5033 Acute on chronic diastolic (congestive) heart failure: Secondary | ICD-10-CM

## 2018-12-14 DIAGNOSIS — I350 Nonrheumatic aortic (valve) stenosis: Secondary | ICD-10-CM | POA: Diagnosis not present

## 2018-12-14 DIAGNOSIS — Z952 Presence of prosthetic heart valve: Secondary | ICD-10-CM | POA: Diagnosis not present

## 2018-12-14 DIAGNOSIS — R001 Bradycardia, unspecified: Secondary | ICD-10-CM

## 2018-12-14 DIAGNOSIS — I712 Thoracic aortic aneurysm, without rupture, unspecified: Secondary | ICD-10-CM

## 2018-12-14 MED ORDER — FUROSEMIDE 20 MG PO TABS: 20.0000 mg | ORAL_TABLET | Freq: Every day | ORAL | 11 refills | Status: DC

## 2018-12-14 MED ORDER — POTASSIUM CHLORIDE ER 10 MEQ PO TBCR: 10.0000 meq | EXTENDED_RELEASE_TABLET | Freq: Every day | ORAL | 11 refills | Status: DC

## 2018-12-14 NOTE — Patient Instructions (Addendum)
Medication Instructions:  1) START LASIX 20 mg daily 2) START POTASSIUM (Kdur) 10 meq daily  Follow-Up: Please keep your upcoming visits as scheduled on 10/15!

## 2019-01-04 NOTE — Progress Notes (Signed)
[facility] [place]                                       Cardiology Office Note    Date:  01/06/2019   ID:  Gregory Black, DOB 09-12-1945, MRN [id number]  PCP:  Kirk Ruths, MD  Cardiologist: Dr. Ubaldo Glassing / Dr. Burt Knack & Dr. Roxy Manns (TAVR)  CC: 1 month s/p TAVR  History of Present Illness:  Gregory Black is a 73 y.o. male with a history of sinus bradycardia,prostate cancer, degenerative arthritis and severe AS s/p TAVR (12/06/18) who presents to clinic for follow up.   Patient has known of presence of a heart murmur for more than 10 years. He has been followed regularly by Dr. Duanne Guess serial echocardiograms that have documented the presence of aortic stenosis that has gradually progressed in severity. Patient has remained healthy and physically active until fairly recently when he began to experience episodes of exertional chest tightness and shortness of breath that only occur with moderate or strenuous physical activity. In June the patient was working outdoors when he suffered a syncopal event. He was taken to [facility] where troponin levels were minimally elevated. Follow-up echocardiogram revealed normal left ventricular systolic function with severe aortic stenosis. The patient has been followed since then by Dr. Ubaldo Glassing and underwent Holter monitor that revealed sinus bradycardia with no evidence for AV block or symptomatic bradycardia. Diagnostic cardiac catheterization was performed demonstrating normal coronary artery anatomy with no significant coronary artery disease.   The paitient was evaluated by the multidisciplinary valve team and underwent successful TAVR with a 26 mm Edwards Sapien 3 THV via the TF approach on 12/06/18. He has profound post op sinus bradycardia with HRs in the 20s with up to 3.3 second pauses. Post operative echo showed EF 60-65% with normally functioning TAVR with elevated mean gradient of 22 mmHg and  trivial PVL. His hospital course was uncomplicated and he was discharged on POD1 on aspirin and plavix as well as a zio patch to monitor his HR.  At one week follow up he had some LE edema and bendopnea. I started him on lasix.   Today he presents to clinic for follow up. He has been taking lasix PRN. It has helped his swelling. He has been getting hiccups now everyday. He is headed to [employer] for a couple weeks. No CP. He occasionally gets SOB but only when walking up and down the hill near his house. Mild LE edema for which he takes PRN lasix. No orthopnea or PND. No dizziness or syncope. No blood in stool or urine. No palpitations. Stopped taking aspirin bc a nurse told him he didn't need it with plaivx.    Past Medical History:  Diagnosis Date  . Arthritis    hands, knees  . Bulging lumbar disc   . GERD (gastroesophageal reflux disease)   . History of hiatal hernia   . History of prostate cancer   . Neuropathy   . S/P TAVR (transcatheter aortic valve replacement) 12/06/2018   a. 12/06/18: s/p TAVR with a 26 mm Edwards S3U THV via the TF approach  . Severe aortic stenosis   . Sinus bradycardia   . Thoracic ascending aortic aneurysm (HCC)    4.4 cm 11/30/18 CTA  . Wears dentures    full upper and lower    Past  Surgical History:  Procedure Laterality Date  . CARDIAC CATHETERIZATION     No significant CAD 10/2018  . CATARACT EXTRACTION Left 2015  . COLONOSCOPY    . COLONOSCOPY WITH PROPOFOL N/A 05/17/2015   Procedure: COLONOSCOPY WITH PROPOFOL;  Surgeon: Lucilla Lame, MD;  Location: [facility];  Service: Endoscopy;  Laterality: N/A;  . EYE SURGERY    . KNEE ARTHROSCOPY W/ MENISCAL REPAIR Right   . RADIOACTIVE SEED IMPLANT N/A 07/26/2017   Procedure: RADIOACTIVE SEED IMPLANT/BRACHYTHERAPY IMPLANT;  Surgeon: Hollice Espy, MD;  Location: [facility];  Service: Urology;  Laterality: N/A;  . RIGHT/LEFT HEART CATH AND CORONARY ANGIOGRAPHY Bilateral 11/15/2018   Procedure:  RIGHT/LEFT HEART CATH AND CORONARY ANGIOGRAPHY;  Surgeon: Teodoro Spray, MD;  Location: [facility];  Service: Cardiovascular;  Laterality: Bilateral;  . TEE WITHOUT CARDIOVERSION N/A 12/06/2018   Procedure: TRANSESOPHAGEAL ECHOCARDIOGRAM (TEE);  Surgeon: Sherren Mocha, MD;  Location: [facility];  Service: Open Heart Surgery;  Laterality: N/A;  . TONSILLECTOMY    . TONSILLECTOMY AND ADENOIDECTOMY    . TRANSCATHETER AORTIC VALVE REPLACEMENT, TRANSFEMORAL N/A 12/06/2018   Procedure: TRANSCATHETER AORTIC VALVE REPLACEMENT, TRANSFEMORAL approach with insertion of 66mm ultra sapien edwards valve;  Surgeon: Sherren Mocha, MD;  Location: [facility];  Service: Open Heart Surgery;  Laterality: N/A;    Current Medications: Outpatient Medications Prior to Visit  Medication Sig Dispense Refill  . clopidogrel (PLAVIX) 75 MG tablet Take 1 tablet (75 mg total) by mouth daily with breakfast. 90 tablet 1  . furosemide (LASIX) 20 MG tablet Take 1 tablet (20 mg total) by mouth daily. 30 tablet 11  . Multiple Vitamin (MULTIVITAMIN WITH MINERALS) TABS tablet Take 1 tablet by mouth daily.    . Omega-3 Fatty Acids (FISH OIL) 1000 MG CAPS Take 1,000 mg by mouth daily.    . potassium chloride (K-DUR) 10 MEQ tablet Take 1 tablet (10 mEq total) by mouth daily. 30 tablet 11  . vitamin B-12 (CYANOCOBALAMIN) 1000 MCG tablet Take 1,000 mcg by mouth daily.    Marland Kitchen aspirin 81 MG tablet Take 1 tablet (81 mg total) by mouth daily. 90 tablet 6   Facility-Administered Medications Prior to Visit  Medication Dose Route Frequency Provider Last Rate Last Dose  . sodium chloride flush (NS) 0.9 % injection 3 mL  3 mL Intravenous Q12H Teodoro Spray, MD         Allergies:   Vicodin [hydrocodone-acetaminophen] and Pollen extract   Social History   Socioeconomic History  . Marital status: Married    Spouse name: Not on file  . Number of children: Not on file  . Years of education: Not on file  . Highest education level: Not on  file  Occupational History  . Not on file  Social Needs  . Financial resource strain: Not on file  . Food insecurity    Worry: Not on file    Inability: Not on file  . Transportation needs    Medical: Not on file    Non-medical: Not on file  Tobacco Use  . Smoking status: Former Smoker    Quit date: 12/02/1998    Years since quitting: 20.1  . Smokeless tobacco: Never Used  . Tobacco comment: quit approx 2010  Substance and Sexual Activity  . Alcohol use: Yes    Alcohol/week: 1.0 standard drinks    Types: 1 Cans of beer per week    Comment: 1 beer a week  . Drug use: No  .  Sexual activity: Not on file  Lifestyle  . Physical activity    Days per week: Not on file    Minutes per session: Not on file  . Stress: Not on file  Relationships  . Social Herbalist on phone: Not on file    Gets together: Not on file    Attends religious service: Not on file    Active member of club or organization: Not on file    Attends meetings of clubs or organizations: Not on file    Relationship status: Not on file  Other Topics Concern  . Not on file  Social History Narrative  . Not on file     Family History:  The patient'sfamily history includes Arthritis in his father; Cancer in his brother and father; Diabetes in his brother and mother; Heart disease in his mother; Hypertension in his mother; Lung disease in his brother; Stroke in his mother.     ROS:   Please see the history of present illness.    ROS All other systems reviewed and are negative.   PHYSICAL EXAM:   VS:  BP (!) 161/78   Pulse (!) 50   Ht 5\' 9"  (1.753 m)   Wt 201 lb (91.2 kg)   BMI 29.68 kg/m    GEN: Well nourished, well developed, in no acute distress HEENT: normal Neck: no JVD or masses Cardiac: RRR; no murmurs, rubs, or gallops, trace LE edema.  Respiratory:  clear to auscultation bilaterally, normal work of breathing GI: soft, nontender, nondistended, + BS MS: no deformity or atrophy Skin:  warm and dry, no rash Neuro:  Alert and Oriented x 3, Strength and sensation are intact Psych: euthymic mood, full affect   Wt Readings from Last 3 Encounters:  01/05/19 201 lb (91.2 kg)  12/14/18 201 lb 1.9 oz (91.2 kg)  12/07/18 200 lb 13.4 oz (91.1 kg)      Studies/Labs Reviewed:   EKG:  EKG is ordered today.  The ekg ordered today demonstrates sinus brady with 1st deg AV block , HR 51.   Recent Labs: 12/02/2018: ALT 12; B Natriuretic Peptide 202.1 12/07/2018: BUN 13; Creatinine, Ser 1.20; Hemoglobin 12.9; Magnesium 1.8; Platelets 135; Potassium 3.9; Sodium 139   Lipid Panel    Component Value Date/Time   CHOL 179 09/11/2018 0622   CHOL 185 04/03/2015 1449   TRIG 124 09/11/2018 0622   HDL 38 (L) 09/11/2018 0622   HDL 52 04/03/2015 1449   CHOLHDL 4.7 09/11/2018 0622   VLDL 25 09/11/2018 0622   LDLCALC 116 (H) 09/11/2018 0622   LDLCALC 120 (H) 04/03/2015 1449    Additional studies/ records that were reviewed today include:  TAVR OPERATIVE NOTE   Date of Procedure:12/06/2018  Preoperative Diagnosis:Severe Aortic Stenosis   Postoperative Diagnosis:Same   Procedure:   Transcatheter Aortic Valve Replacement - PercutaneousRightTransfemoral Approach Edwards Sapien 3 Ultra THV (size 24mm, model # 9750TFX, serial # [id number])  Co-Surgeons:Clarence H. Roxy Manns, MD and Sherren Mocha, MD  Anesthesiologist:Adam Marcie Bal, MD  Echocardiographer:Peter Johnsie Cancel, MD  Pre-operative Echo Findings: ? Severe aortic stenosis ? Normalleft ventricular systolic function  Post-operative Echo Findings: ? Noparavalvular leak ? Normalleft ventricular systolic function  _____________   Echo 12/07/18: IMPRESSIONS  1. The left ventricle has normal systolic function, with an ejection fraction of 60-65%. The cavity size was normal. There is moderately increased left  ventricular wall thickness.  2. The right ventricle has normal systolc function. The cavity was normal. There is no  increase in right ventricular wall thickness. Right ventricular systolic pressure cannot be assessed due to insufficient tricuspid regurgitation.  3. No evidence of mitral valve stenosis.  4. A 26 an Edwards Edwards Sapien bioprosthetic aortic valve (TAVR) valve is present in the aortic position. Procedure Date: 12/06/18 Normal aortic valve prosthesis.  5. Aortic valve regurgitation is trivial by color flow Doppler. Moderate stenosis of the aortic valve.  6. Trivial perivalvular regurgitation. Mean aortic valve gradient moderately elevated (6mmHg).  7. Pulmonic valve regurgitation was not assessed by color flow Doppler.   _____________   Echo 01/05/19 IMPRESSIONS  1. Left ventricular ejection fraction, by visual estimation, is 55 to 60%. The left ventricle has normal function. There is no left ventricular hypertrophy.  2. Global right ventricle has normal systolic function.The right ventricular size is normal. No increase in right ventricular wall thickness.  3. Left atrial size was mildly dilated.  4. Right atrial size was normal.  5. The mitral valve is normal in structure. Trace mitral valve regurgitation.  6. The tricuspid valve is normal in structure. Tricuspid valve regurgitation is trivial.  7. Aortic valve regurgitation is trivial by color flow Doppler.  8. Post TAVR with 26 mm Sapien valve [place] December 07 2018 gradient hare less they were mean 22 and peak 34 mmHg then Trivial AR persists. The SA view is a bit unusual with an area of dense calcium behind valve near right and left commisure and  sinuses are dilated.  9. The pulmonic valve was grossly normal. Pulmonic valve regurgitation is trivial by color flow Doppler. 10. Aortic dilatation noted. 11. There is mild dilatation of the ascending aorta measuring 42 mm. 12. Normal pulmonary artery systolic pressure   ASSESSMENT & PLAN:   Severe AS s/p TAVR: echo today shows EF 55-60%, normally functioning TAVR with improved mean gradient (22-->16 mm Hg) and trivial PVL. There is an area of dense calcium behind valve near right and left commisure and sinuses are dilated on SA view of echo. Will review with Dr. Burt Knack. He has NYHA class I symptoms. SBE prophylaxis discussed; he wears full dentures. topped taking aspirin bc a nurse told him he didn't need it with plaivx. I have asked him to resume aspirin and plavix. Plavix can be discontinued after 6 months of therapy (05/2019).  Elevated BP: no history of HTN but BP is elevated today. I have asked him to get a BP cuff and keep a log for me at home. He will call me next week with his BPs.  Chronic disatolic CHF: appears euvolemic.   Sinus bradycardia: HR in 50s. Zio patch pending formal read.   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 The patient was offered SAVR with replacement of the proximal ascending thoracic aorta but he declined. Recommend annual imaging followup by CTA or MRA. Due 11/2019.   Medication Adjustments/Labs and Tests Ordered: Current medicines are reviewed at length with the patient today.  Concerns regarding medicines are outlined above.  Medication changes, Labs and Tests ordered today are listed in the Patient Instructions below. Patient Instructions  Medication Instructions:  1) START ASPIRIN 81 mg daily 2) you may STOP PLAVIX June 05, 2019  Labwork: TODAY: BMET  Follow-Up: You will be called to arrange your 1 year TAVR visits!  Please get a blood pressure cuff. Monitor your blood pressure and call us with some readings in a week or two.    Signed, Angelena Form, PA-C  01/06/2019 12:58 PM    [facility] [address], [place], [place]  [address] Phone: [phone]; Fax: [phone]

## 2019-01-05 ENCOUNTER — Ambulatory Visit (HOSPITAL_COMMUNITY): Payer: Medicare HMO | Attending: Cardiovascular Disease

## 2019-01-05 ENCOUNTER — Other Ambulatory Visit: Payer: Self-pay | Admitting: Physician Assistant

## 2019-01-05 ENCOUNTER — Other Ambulatory Visit: Payer: Self-pay

## 2019-01-05 ENCOUNTER — Ambulatory Visit: Payer: Medicare HMO | Admitting: Physician Assistant

## 2019-01-05 ENCOUNTER — Encounter: Payer: Self-pay | Admitting: Physician Assistant

## 2019-01-05 VITALS — BP 161/78 | HR 50 | Ht 69.0 in | Wt 201.0 lb

## 2019-01-05 DIAGNOSIS — Z952 Presence of prosthetic heart valve: Secondary | ICD-10-CM | POA: Insufficient documentation

## 2019-01-05 LAB — ECHOCARDIOGRAM COMPLETE
Height: 69 in
Weight: 3216 oz

## 2019-01-05 MED ORDER — ASPIRIN EC 81 MG PO TBEC: 81.0000 mg | DELAYED_RELEASE_TABLET | Freq: Every day | ORAL | 3 refills | Status: AC

## 2019-01-05 NOTE — Patient Instructions (Addendum)
Medication Instructions:  1) START ASPIRIN 81 mg daily 2) you may STOP PLAVIX June 05, 2019  Labwork: TODAY: BMET  Follow-Up: You will be called to arrange your 1 year TAVR visits!  Please get a blood pressure cuff. Monitor your blood pressure and call us with some readings in a week or two.

## 2019-01-06 LAB — BASIC METABOLIC PANEL
BUN/Creatinine Ratio: 14 (ref 10–24)
BUN: 17 mg/dL (ref 8–27)
CO2: 25 mmol/L (ref 20–29)
Calcium: 9.2 mg/dL (ref 8.6–10.2)
Chloride: 102 mmol/L (ref 96–106)
Creatinine, Ser: 1.22 mg/dL (ref 0.76–1.27)
GFR calc Af Amer: 67 mL/min/{1.73_m2} (ref >59)
GFR calc non Af Amer: 58 mL/min/{1.73_m2} — ABNORMAL LOW (ref >59)
Glucose: 93 mg/dL (ref 65–99)
Potassium: 4 mmol/L (ref 3.5–5.2)
Sodium: 142 mmol/L (ref 134–144)

## 2019-01-11 NOTE — Progress Notes (Signed)
Study reviewed and compared to prior. TAVR valve function looks good. I think there is an area of dense annular calcium present just outside the stent frame of the TAVR valve accounting for the echo appearance.

## 2019-01-27 ENCOUNTER — Inpatient Hospital Stay: Payer: Medicare HMO | Attending: Radiation Oncology

## 2019-01-27 ENCOUNTER — Other Ambulatory Visit: Payer: Self-pay

## 2019-01-27 DIAGNOSIS — C61 Malignant neoplasm of prostate: Secondary | ICD-10-CM

## 2019-01-27 LAB — PSA: Prostatic Specific Antigen: 1.03 ng/mL (ref 0.00–4.00)

## 2019-02-02 ENCOUNTER — Other Ambulatory Visit: Payer: Self-pay

## 2019-02-03 ENCOUNTER — Ambulatory Visit
Admission: RE | Admit: 2019-02-03 | Discharge: 2019-02-03 | Disposition: A | Discharge status: Home / Self Care | Payer: Medicare HMO | Source: Ambulatory Visit | Attending: Radiation Oncology | Admitting: Radiation Oncology

## 2019-02-03 ENCOUNTER — Other Ambulatory Visit: Payer: Self-pay

## 2019-02-03 ENCOUNTER — Other Ambulatory Visit: Payer: Self-pay | Admitting: *Deleted

## 2019-02-03 ENCOUNTER — Encounter: Payer: Self-pay | Admitting: Radiation Oncology

## 2019-02-03 VITALS — BP 177/72 | HR 42 | Temp 97.7°F | Resp 16 | Wt 199.6 lb

## 2019-02-03 DIAGNOSIS — C61 Malignant neoplasm of prostate: Secondary | ICD-10-CM | POA: Insufficient documentation

## 2019-02-03 DIAGNOSIS — Z923 Personal history of irradiation: Secondary | ICD-10-CM | POA: Insufficient documentation

## 2019-02-03 NOTE — Progress Notes (Signed)
Radiation Oncology Follow up Note  Name: Gregory Black   Date:   02/03/2019 MRN:  [id number] DOB: 01/01/1946    This 73 y.o. male presents to the clinic today for 53-month follow-up status post interstitial implant for Gleason 7 adenocarcinoma the prostate.  REFERRING PROVIDER: Kirk Ruths, MD  HPI: Patient is a 73 year old male now about 18 months having completed I-125 interstitial implant for Gleason 7 (3+4) adenocarcinoma the prostate.  His PSA is 1.03 down from 1.7 several months prior.    Present time from a prostate standpoint he is asymptomatic specifically denies diarrhea dysuria or any other GI/GU complaints.  He is seen today in routine follow-up.  Patient had a CT scan of abdomen and pelvis back in September for evaluation of transcatheter aortic valve replacement procedure which I have reviewed.  COMPLICATIONS OF TREATMENT: none  FOLLOW UP COMPLIANCE: keeps appointments   PHYSICAL EXAM:  BP (!) 177/72 (BP Location: Left Arm, Patient Position: Sitting)   Pulse (!) 42   Temp 97.7 F (36.5 C) (Tympanic)   Resp 16   Wt 199 lb 9.6 oz (90.5 kg)   BMI 29.48 kg/m  Well-developed well-nourished patient in NAD. HEENT reveals PERLA, EOMI, discs not visualized.  Oral cavity is clear. No oral mucosal lesions are identified. Neck is clear without evidence of cervical or supraclavicular adenopathy. Lungs are clear to A&P. Cardiac examination is essentially unremarkable with regular rate and rhythm without murmur rub or thrill. Abdomen is benign with no organomegaly or masses noted. Motor sensory and DTR levels are equal and symmetric in the upper and lower extremities. Cranial nerves II through XII are grossly intact. Proprioception is intact. No peripheral adenopathy or edema is identified. No motor or sensory levels are noted. Crude visual fields are within normal range.  RADIOLOGY RESULTS: CT scan of abdomen pelvis from September reviewed showing no evidence to suggest  progressive adenopathy or prostate cancer.  PLAN: Present time patient is doing well under excellent biochemical control of his prostate cancer.  I am pleased with his overall progress.  I will start seeing him on a once year basis.  With a PSA prior to visit.  Patient knows to call sooner with any concerns.  I would like to take this opportunity to thank you for allowing me to participate in the care of your patient.Noreene Filbert, MD

## 2019-10-02 ENCOUNTER — Other Ambulatory Visit: Payer: Self-pay | Admitting: Physician Assistant

## 2019-10-02 DIAGNOSIS — Z952 Presence of prosthetic heart valve: Secondary | ICD-10-CM

## 2019-11-28 NOTE — Progress Notes (Signed)
HEART AND [place]                                       Cardiology Office Note    Date:  11/29/2019   ID:  Gregory BARBERI, DOB 07-09-1945, MRN [id number]  PCP:  Kirk Ruths, MD  Cardiologist: Dr. Ubaldo Glassing / Dr. Burt Knack & Dr. Roxy Manns (TAVR)  CC: 1 year s/p TAVR  History of Present Illness:  Gregory Black is a 74 y.o. male with a history of sinus bradycardia,prostate cancer, degenerative arthritis, TAA and severe AS s/p TAVR (12/06/18) who presents to clinic for follow up.   Patient has known of presence of a heart murmur for more than 10 years. He has been followed regularly by Dr. Duanne Guess serial echocardiograms that have documented the presence of aortic stenosis that has gradually progressed in severity. Patient has remained healthy and physically active until last year when he began to experience episodes of exertional chest tightness and shortness of breath that only occur with moderate or strenuous physical activity. In June of 2020, the patient was working outdoors when he suffered a syncopal event. He was taken to [facility] where troponin levels were minimally elevated. Follow-up echocardiogram revealed normal left ventricular systolic function with severe aortic stenosis. The patient has been followed since then by Dr. Ubaldo Glassing and underwent Holter monitor that revealed sinus bradycardia with no evidence for AV block or symptomatic bradycardia. Diagnostic cardiac catheterization was performed demonstrating normal coronary artery anatomy with no significant coronary artery disease.   The patient was evaluated by the multidisciplinary valve team and underwent successful TAVR with a 26 mm Edwards Sapien 3 THV via the TF approach on 12/06/18. He has profound post op sinus bradycardia with HRs in the 20s with up to 3.3 second pauses. Post operative echo showed EF 60-65% with normally functioning TAVR with elevated mean gradient of 22 mmHg and  trivial PVL. His hospital course was uncomplicated and he was discharged on POD1 on aspirin and plavix as well as a zio patch to monitor his HR.  At one week follow up he had some LE edema and bendopnea and lasix was started PRN. 1 month echo showed EF 55-60%, normally functioning TAVR with improved mean gradient (22-->16 mm Hg) and trivial PVL. Zio patch showed NSR with ave HRs in 60s, occasional PVCs, NSVT, are few episodes of high-grade heart block, second-degree type II and third-degree, both appear transient in nature and associated with a narrow complex escape rhythm as well as sinus pauses up to 3.6 seconds. The patient was asymptomatic and Dr. Rayann Heman (EP) recommended continued monitoring.   Last seen by Dr. Bethanne Ginger PA in 11/2019. She mentioned getting a CTA to follow TAA in September.  Today he presents to clinic for follow up. Doing well. Runs a mile everyday. No CP or SOB. No LE edema, orthopnea or PND. Has some mild dizziness with positional changes but no syncope. No blood in stool or urine. No palpitations.    Past Medical History:  Diagnosis Date  . Arthritis    hands, knees  . Bulging lumbar disc   . GERD (gastroesophageal reflux disease)   . History of hiatal hernia   . History of prostate cancer   . Neuropathy   . S/P TAVR (transcatheter aortic valve replacement) 12/06/2018   a. 12/06/18: s/p TAVR with a 26 mm  Edwards S3U THV via the TF approach  . Severe aortic stenosis   . Sinus bradycardia   . Thoracic ascending aortic aneurysm (HCC)    4.4 cm 11/30/18 CTA  . Wears dentures    full upper and lower    Past Surgical History:  Procedure Laterality Date  . CARDIAC CATHETERIZATION     No significant CAD 10/2018  . CATARACT EXTRACTION Left 2015  . COLONOSCOPY    . COLONOSCOPY WITH PROPOFOL N/A 05/17/2015   Procedure: COLONOSCOPY WITH PROPOFOL;  Surgeon: Lucilla Lame, MD;  Location: [place];  Service: Endoscopy;  Laterality: N/A;  . EYE SURGERY    . KNEE  ARTHROSCOPY W/ MENISCAL REPAIR Right   . RADIOACTIVE SEED IMPLANT N/A 07/26/2017   Procedure: RADIOACTIVE SEED IMPLANT/BRACHYTHERAPY IMPLANT;  Surgeon: Hollice Espy, MD;  Location: [facility];  Service: Urology;  Laterality: N/A;  . RIGHT/LEFT HEART CATH AND CORONARY ANGIOGRAPHY Bilateral 11/15/2018   Procedure: RIGHT/LEFT HEART CATH AND CORONARY ANGIOGRAPHY;  Surgeon: Teodoro Spray, MD;  Location: [place] [facility];  Service: Cardiovascular;  Laterality: Bilateral;  . TEE WITHOUT CARDIOVERSION N/A 12/06/2018   Procedure: TRANSESOPHAGEAL ECHOCARDIOGRAM (TEE);  Surgeon: Sherren Mocha, MD;  Location: [place];  Service: Open Heart Surgery;  Laterality: N/A;  . TONSILLECTOMY    . TONSILLECTOMY AND ADENOIDECTOMY    . TRANSCATHETER AORTIC VALVE REPLACEMENT, TRANSFEMORAL N/A 12/06/2018   Procedure: TRANSCATHETER AORTIC VALVE REPLACEMENT, TRANSFEMORAL approach with insertion of 35mm ultra sapien edwards valve;  Surgeon: Sherren Mocha, MD;  Location: [facility];  Service: Open Heart Surgery;  Laterality: N/A;    Current Medications: Outpatient Medications Prior to Visit  Medication Sig Dispense Refill  . aspirin EC 81 MG tablet Take 1 tablet (81 mg total) by mouth daily. 90 tablet 3  . Multiple Vitamin (MULTIVITAMIN WITH MINERALS) TABS tablet Take 1 tablet by mouth daily.    . Omega-3 Fatty Acids (FISH OIL) 1000 MG CAPS Take 1,000 mg by mouth daily.    . vitamin B-12 (CYANOCOBALAMIN) 1000 MCG tablet Take 1,000 mcg by mouth daily.    . clopidogrel (PLAVIX) 75 MG tablet Take 1 tablet (75 mg total) by mouth daily with breakfast. 90 tablet 1  . furosemide (LASIX) 20 MG tablet Take 1 tablet (20 mg total) by mouth daily. 30 tablet 11  . potassium chloride (K-DUR) 10 MEQ tablet Take 1 tablet (10 mEq total) by mouth daily. 30 tablet 11   Facility-Administered Medications Prior to Visit  Medication Dose Route Frequency Provider Last Rate Last Admin  . sodium chloride flush (NS) 0.9 % injection 3 mL  3 mL  Intravenous Q12H Teodoro Spray, MD         Allergies:   Vicodin [hydrocodone-acetaminophen] and Pollen extract   Social History   Socioeconomic History  . Marital status: Married    Spouse name: Not on file  . Number of children: Not on file  . Years of education: Not on file  . Highest education level: Not on file  Occupational History  . Not on file  Tobacco Use  . Smoking status: Former Smoker    Quit date: 12/02/1998    Years since quitting: 21.0  . Smokeless tobacco: Never Used  . Tobacco comment: quit approx 2010  Vaping Use  . Vaping Use: Never used  Substance and Sexual Activity  . Alcohol use: Yes    Alcohol/week: 1.0 standard drink    Types: 1 Cans of beer per week    Comment: 1  beer a week  . Drug use: No  . Sexual activity: Not on file  Other Topics Concern  . Not on file  Social History Narrative  . Not on file   Social Determinants of Health   Financial Resource Strain:   . Difficulty of Paying Living Expenses: Not on file  Food Insecurity:   . Worried About Charity fundraiser in the Last Year: Not on file  . Ran Out of Food in the Last Year: Not on file  Transportation Needs:   . Lack of Transportation (Medical): Not on file  . Lack of Transportation (Non-Medical): Not on file  Physical Activity:   . Days of Exercise per Week: Not on file  . Minutes of Exercise per Session: Not on file  Stress:   . Feeling of Stress : Not on file  Social Connections:   . Frequency of Communication with Friends and Family: Not on file  . Frequency of Social Gatherings with Friends and Family: Not on file  . Attends Religious Services: Not on file  . Active Member of Clubs or Organizations: Not on file  . Attends Archivist Meetings: Not on file  . Marital Status: Not on file     Family History:  The patient'sfamily history includes Arthritis in his father; Cancer in his brother and father; Diabetes in his brother and mother; Heart disease in his  mother; Hypertension in his mother; Lung disease in his brother; Stroke in his mother.     ROS:   Please see the history of present illness.    ROS All other systems reviewed and are negative.   PHYSICAL EXAM:   VS:  BP (!) 156/82   Pulse 60   Ht 5\' 9"  (1.753 m)   Wt 194 lb (88 kg)   SpO2 99%   BMI 28.65 kg/m    GEN: Well nourished, well developed, in no acute distress HEENT: normal Neck: no JVD or masses Cardiac: RRR; no murmurs, rubs, or gallops, trace LE edema.  Respiratory:  clear to auscultation bilaterally, normal work of breathing GI: soft, nontender, nondistended, + BS MS: no deformity or atrophy Skin: warm and dry, no rash Neuro:  Alert and Oriented x 3, Strength and sensation are intact Psych: euthymic mood, full affect   Wt Readings from Last 3 Encounters:  11/29/19 194 lb (88 kg)  02/03/19 199 lb 9.6 oz (90.5 kg)  01/05/19 201 lb (91.2 kg)      Studies/Labs Reviewed:   EKG:  EKG is NOT ordered today.   Recent Labs: 12/02/2018: ALT 12; B Natriuretic Peptide 202.1 12/07/2018: Hemoglobin 12.9; Magnesium 1.8; Platelets 135 01/05/2019: BUN 17; Creatinine, Ser 1.22; Potassium 4.0; Sodium 142   Lipid Panel    Component Value Date/Time   CHOL 179 09/11/2018 0622   CHOL 185 04/03/2015 1449   TRIG 124 09/11/2018 0622   HDL 38 (L) 09/11/2018 0622   HDL 52 04/03/2015 1449   CHOLHDL 4.7 09/11/2018 0622   VLDL 25 09/11/2018 0622   LDLCALC 116 (H) 09/11/2018 0622   LDLCALC 120 (H) 04/03/2015 1449    Additional studies/ records that were reviewed today include:  TAVR OPERATIVE NOTE   Date of Procedure:12/06/2018  Preoperative Diagnosis:Severe Aortic Stenosis   Postoperative Diagnosis:Same   Procedure:   Transcatheter Aortic Valve Replacement - PercutaneousRightTransfemoral Approach Edwards Sapien 3 Ultra THV (size 52mm, model # 9750TFX, serial #  [id number])  Co-Surgeons:Clarence H. Roxy Manns, MD and Sherren Mocha, MD  Anesthesiologist:Adam Marcie Bal, MD  Echocardiographer:Peter Johnsie Cancel, MD  Pre-operative Echo Findings: ? Severe aortic stenosis ? Normalleft ventricular systolic function  Post-operative Echo Findings: ? Noparavalvular leak ? Normalleft ventricular systolic function  _____________   Echo 12/07/18: IMPRESSIONS  1. The left ventricle has normal systolic function, with an ejection fraction of 60-65%. The cavity size was normal. There is moderately increased left ventricular wall thickness.  2. The right ventricle has normal systolc function. The cavity was normal. There is no increase in right ventricular wall thickness. Right ventricular systolic pressure cannot be assessed due to insufficient tricuspid regurgitation.  3. No evidence of mitral valve stenosis.  4. A 26 an Edwards Edwards Sapien bioprosthetic aortic valve (TAVR) valve is present in the aortic position. Procedure Date: 12/06/18 Normal aortic valve prosthesis.  5. Aortic valve regurgitation is trivial by color flow Doppler. Moderate stenosis of the aortic valve.  6. Trivial perivalvular regurgitation. Mean aortic valve gradient moderately elevated (62mmHg).  7. Pulmonic valve regurgitation was not assessed by color flow Doppler.   _____________   Echo 01/05/19 IMPRESSIONS  1. Left ventricular ejection fraction, by visual estimation, is 55 to 60%. The left ventricle has normal function. There is no left ventricular hypertrophy.  2. Global right ventricle has normal systolic function.The right ventricular size is normal. No increase in right ventricular wall thickness.  3. Left atrial size was mildly dilated.  4. Right atrial size was normal.  5. The mitral valve is normal in structure. Trace mitral valve regurgitation.  6. The tricuspid valve is normal in structure. Tricuspid valve  regurgitation is trivial.  7. Aortic valve regurgitation is trivial by color flow Doppler.  8. Post TAVR with 26 mm Sapien valve [place] December 07 2018 gradient hare less they were mean 22 and peak 34 mmHg then Trivial AR persists. The SA view is a bit unusual with an area of dense calcium behind valve near right and left commisure and  sinuses are dilated.  9. The pulmonic valve was grossly normal. Pulmonic valve regurgitation is trivial by color flow Doppler. 10. Aortic dilatation noted. 11. There is mild dilatation of the ascending aorta measuring 42 mm. 12. Normal pulmonary artery systolic pressure  _____________________  Echo 11/29/19 IMPRESSIONS  1. Left ventricular ejection fraction, by estimation, is 55 to 60%. The left ventricle has normal function. The left ventricle has no regional wall motion abnormalities. Left ventricular diastolic parameters are consistent with Grade II diastolic  dysfunction (pseudonormalization).  2. Right ventricular systolic function is normal. The right ventricular size is mildly enlarged.  3. The mitral valve is normal in structure. Trivial mitral valve regurgitation. No evidence of mitral stenosis.  4. Mild perivalvular leak along mitral valve annular region. The aortic valve has been repaired/replaced. Aortic valve regurgitation is mild. There is a 26 mm Sapien prosthetic (TAVR) valve present in the aortic position. Aortic valve mean gradient  measures 14.2 mmHg. Aortic valve Vmax measures 2.39 m/s.  5. Aortic dilatation noted. There is mild to moderate dilatation of the ascending aorta, measuring 44 mm.  6. The inferior vena cava is normal in size with greater than 50% respiratory variability, suggesting right atrial pressure of 3 mmHg.   ASSESSMENT & PLAN:   Severe AS s/p TAVR:  echo today shows EF 55%, normally functioning TAVR with a mean 14.2 mm Hg and mild PVL (previously trivial).  He has NYHA class I symptoms and runs everyday. SBE prophylaxis  discussed; he wears full dentures. Cotninue on ASA 81 mg daily indefinitely.   Sinus  bradycardia and transient HAVB: Intermittent high grade heart block detected on long term monitoring, however, patient continues to remain asymptomatic from this   TAA: ectasia of ascending thoracic aorta (4.4 cm in diameter) noted on pre TAVR CT. The patient was offered SAVR with replacement of the proximal ascending thoracic aorta but he declined. Recommend annual imaging followup by CTA or MRA. Due 11/2019.Dr. Bethanne Ginger PA, Jonette Pesa mentioned that she would order this study. I will make sure this gets followed up on at [facility]  HTN: BP has been elevated and he has mild LE edema. Will start HCTZ- triamterene. He will need a BMET in a couple weeks. I will ask Jefm Bryant to get follow up labs so he doesn't have to come to [facility].   Medication Adjustments/Labs and Tests Ordered: Current medicines are reviewed at length with the patient today.  Concerns regarding medicines are outlined above.  Medication changes, Labs and Tests ordered today are listed in the Patient Instructions below. Patient Instructions  Medication Instructions:  1) START Triamterene/HCT 37.5/25mg  once daily  *If you need a refill on your cardiac medications before your next appointment, please call your pharmacy*   Lab Work: You will need to get a BMET soon with your Primary Cardiologist.  If you have labs (blood work) drawn today and your tests are completely normal, you will receive your results only by: Marland Kitchen MyChart Message (if you have MyChart) OR . A paper copy in the mail If you have any lab test that is abnormal or we need to change your treatment, we will call you to review the results.   Testing/Procedures: None   Follow-Up: At [facility], you and your health needs are our priority.  As part of our continuing mission to provide you with exceptional heart care, we have created designated Provider Care Teams.  These  Care Teams include your primary Cardiologist (physician) and Advanced Practice Providers (APPs -  Physician Assistants and Nurse Practitioners) who all work together to provide you with the care you need, when you need it.  We recommend signing up for the patient portal called "MyChart".  Sign up information is provided on this After Visit Summary.  MyChart is used to connect with patients for Virtual Visits (Telemedicine).  Patients are able to view lab/test results, encounter notes, upcoming appointments, etc.  Non-urgent messages can be sent to your provider as well.   To learn more about what you can do with MyChart, go to [email or web address].    Your next appointment:   As needed  The format for your next appointment:   In Person  Provider:   Nell Range, PA-C   Other Instructions      Signed, Angelena Form, PA-C  11/29/2019 2:38 PM    [address], [place], [place]  [address] Phone: [phone]; Fax: [phone]

## 2019-11-29 ENCOUNTER — Ambulatory Visit: Payer: Medicare HMO | Admitting: Physician Assistant

## 2019-11-29 ENCOUNTER — Encounter: Payer: Self-pay | Admitting: Physician Assistant

## 2019-11-29 ENCOUNTER — Other Ambulatory Visit: Payer: Self-pay

## 2019-11-29 ENCOUNTER — Ambulatory Visit (HOSPITAL_COMMUNITY): Payer: Medicare HMO | Attending: Cardiology

## 2019-11-29 VITALS — BP 156/82 | HR 60 | Ht 69.0 in | Wt 194.0 lb

## 2019-11-29 DIAGNOSIS — R001 Bradycardia, unspecified: Secondary | ICD-10-CM | POA: Diagnosis not present

## 2019-11-29 DIAGNOSIS — I712 Thoracic aortic aneurysm, without rupture, unspecified: Secondary | ICD-10-CM

## 2019-11-29 DIAGNOSIS — Z952 Presence of prosthetic heart valve: Secondary | ICD-10-CM | POA: Diagnosis present

## 2019-11-29 DIAGNOSIS — I10 Essential (primary) hypertension: Secondary | ICD-10-CM

## 2019-11-29 LAB — ECHOCARDIOGRAM COMPLETE
AV Mean grad: 14.2 mmHg
AV Peak grad: 22.9 mmHg
Ao pk vel: 2.39 m/s
Area-P 1/2: 2.48 cm2
P 1/2 time: 726 msec
S' Lateral: 3.1 cm

## 2019-11-29 MED ORDER — TRIAMTERENE-HCTZ 37.5-25 MG PO TABS: 1.0000 | ORAL_TABLET | Freq: Every day | ORAL | 3 refills | Status: DC

## 2019-11-29 NOTE — Patient Instructions (Signed)
Medication Instructions:  1) START Triamterene/HCT 37.5/25mg  once daily  *If you need a refill on your cardiac medications before your next appointment, please call your pharmacy*   Lab Work: You will need to get a BMET soon with your Primary Cardiologist.  If you have labs (blood work) drawn today and your tests are completely normal, you will receive your results only by: Marland Kitchen MyChart Message (if you have MyChart) OR . A paper copy in the mail If you have any lab test that is abnormal or we need to change your treatment, we will call you to review the results.   Testing/Procedures: None   Follow-Up: At [facility], you and your health needs are our priority.  As part of our continuing mission to provide you with exceptional heart care, we have created designated Provider Care Teams.  These Care Teams include your primary Cardiologist (physician) and Advanced Practice Providers (APPs -  Physician Assistants and Nurse Practitioners) who all work together to provide you with the care you need, when you need it.  We recommend signing up for the patient portal called "MyChart".  Sign up information is provided on this After Visit Summary.  MyChart is used to connect with patients for Virtual Visits (Telemedicine).  Patients are able to view lab/test results, encounter notes, upcoming appointments, etc.  Non-urgent messages can be sent to your provider as well.   To learn more about what you can do with MyChart, go to [email or web address].    Your next appointment:   As needed  The format for your next appointment:   In Person  Provider:   Nell Range, PA-C   Other Instructions

## 2020-01-26 ENCOUNTER — Inpatient Hospital Stay: Payer: Medicare HMO | Attending: Radiation Oncology

## 2020-02-02 ENCOUNTER — Ambulatory Visit: Payer: Medicare HMO | Attending: Radiation Oncology | Admitting: Radiation Oncology

## 2020-04-05 DIAGNOSIS — Z20822 Contact with and (suspected) exposure to covid-19: Secondary | ICD-10-CM | POA: Diagnosis not present

## 2020-05-23 DIAGNOSIS — J9811 Atelectasis: Secondary | ICD-10-CM | POA: Diagnosis not present

## 2020-05-23 DIAGNOSIS — I517 Cardiomegaly: Secondary | ICD-10-CM | POA: Diagnosis not present

## 2020-05-23 DIAGNOSIS — Z952 Presence of prosthetic heart valve: Secondary | ICD-10-CM | POA: Diagnosis not present

## 2020-05-23 DIAGNOSIS — R0789 Other chest pain: Secondary | ICD-10-CM | POA: Diagnosis not present

## 2020-08-20 IMAGING — CR CHEST - 2 VIEW
1 series · 2 of 2 positions shown · non-contrast
Comparison: None.

CLINICAL DATA: Shortness of breath

EXAM:
CHEST - 2 VIEW

[Series 1: w chest pa · 0.14mm/px · 2 of 2 slices shown]
[im 1/2]
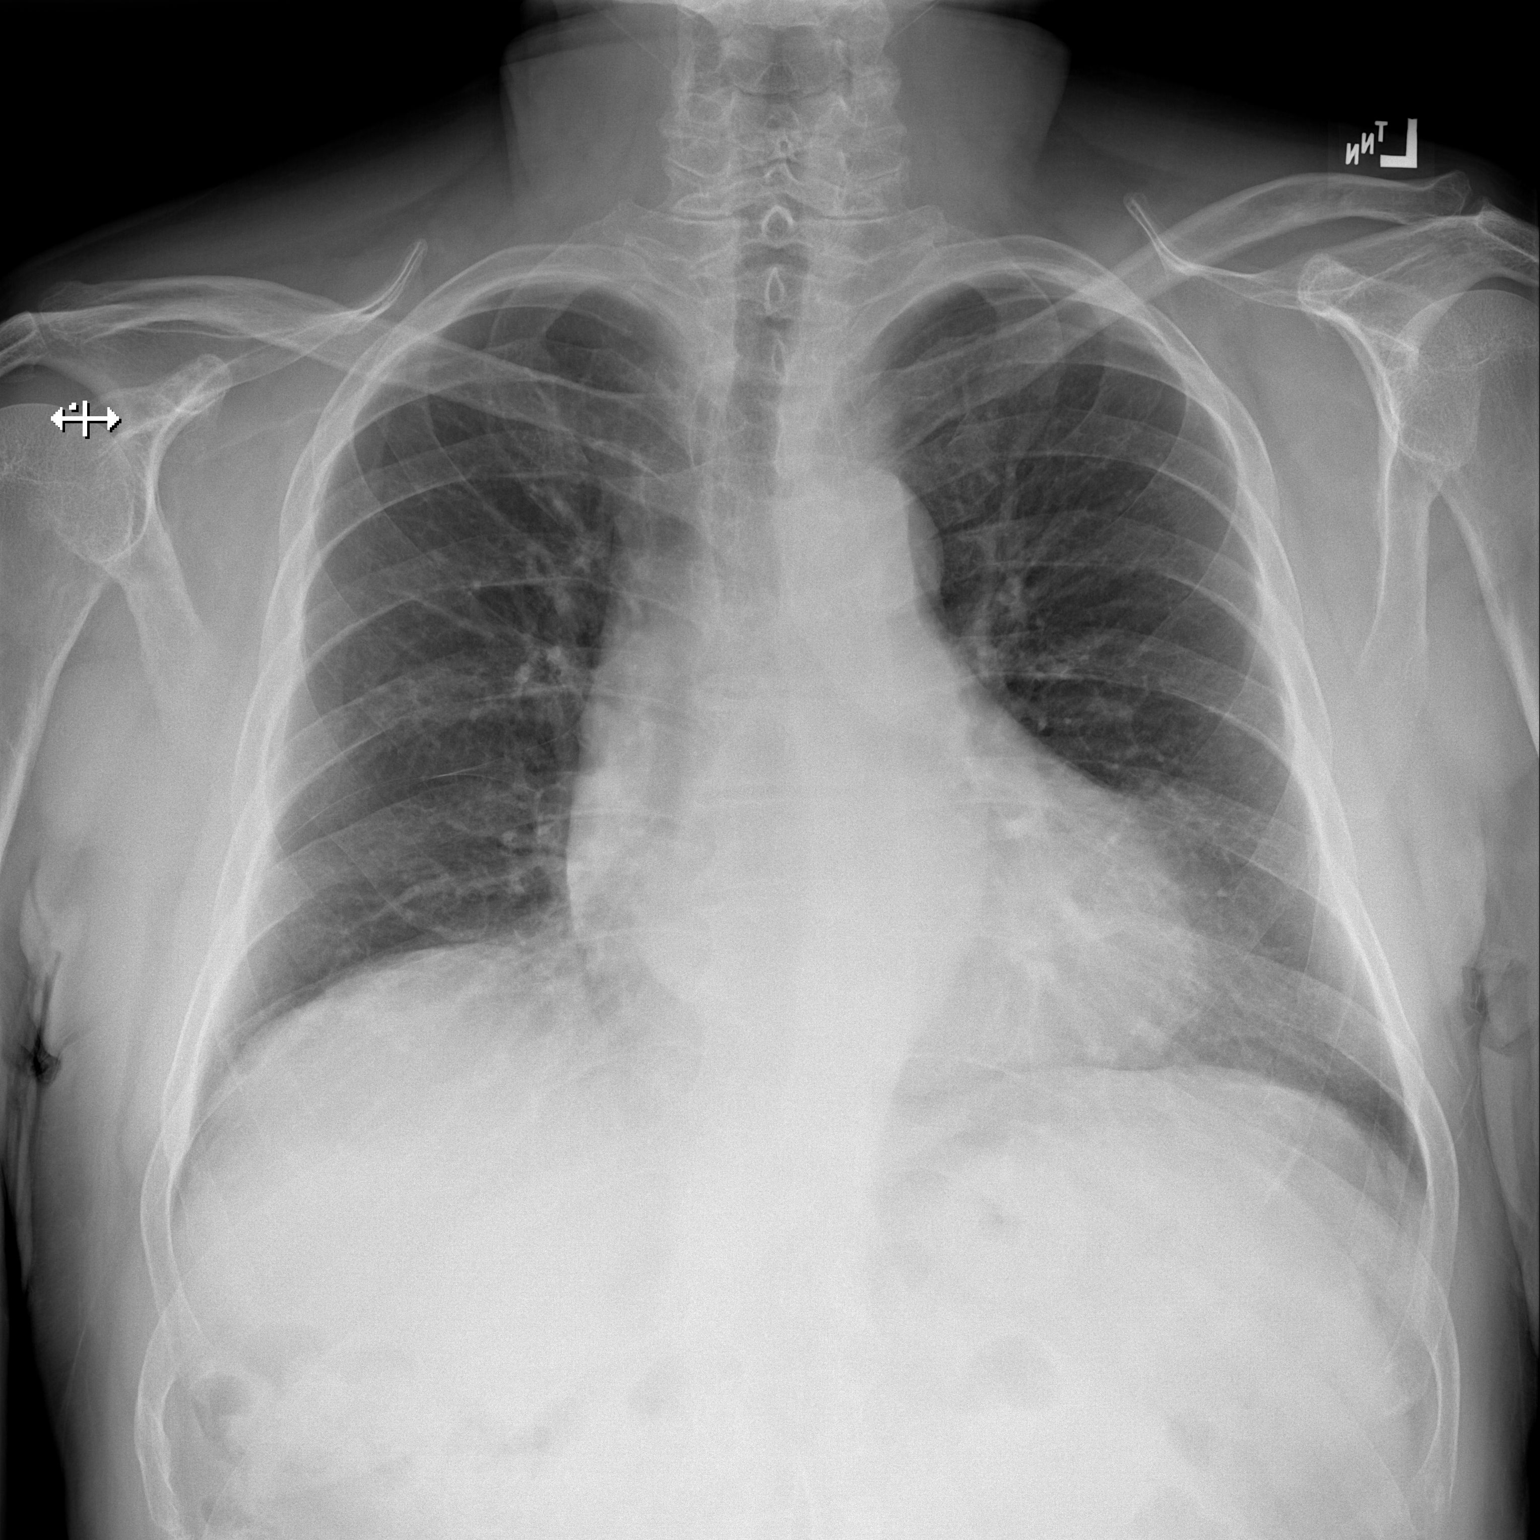
[im 2/2]
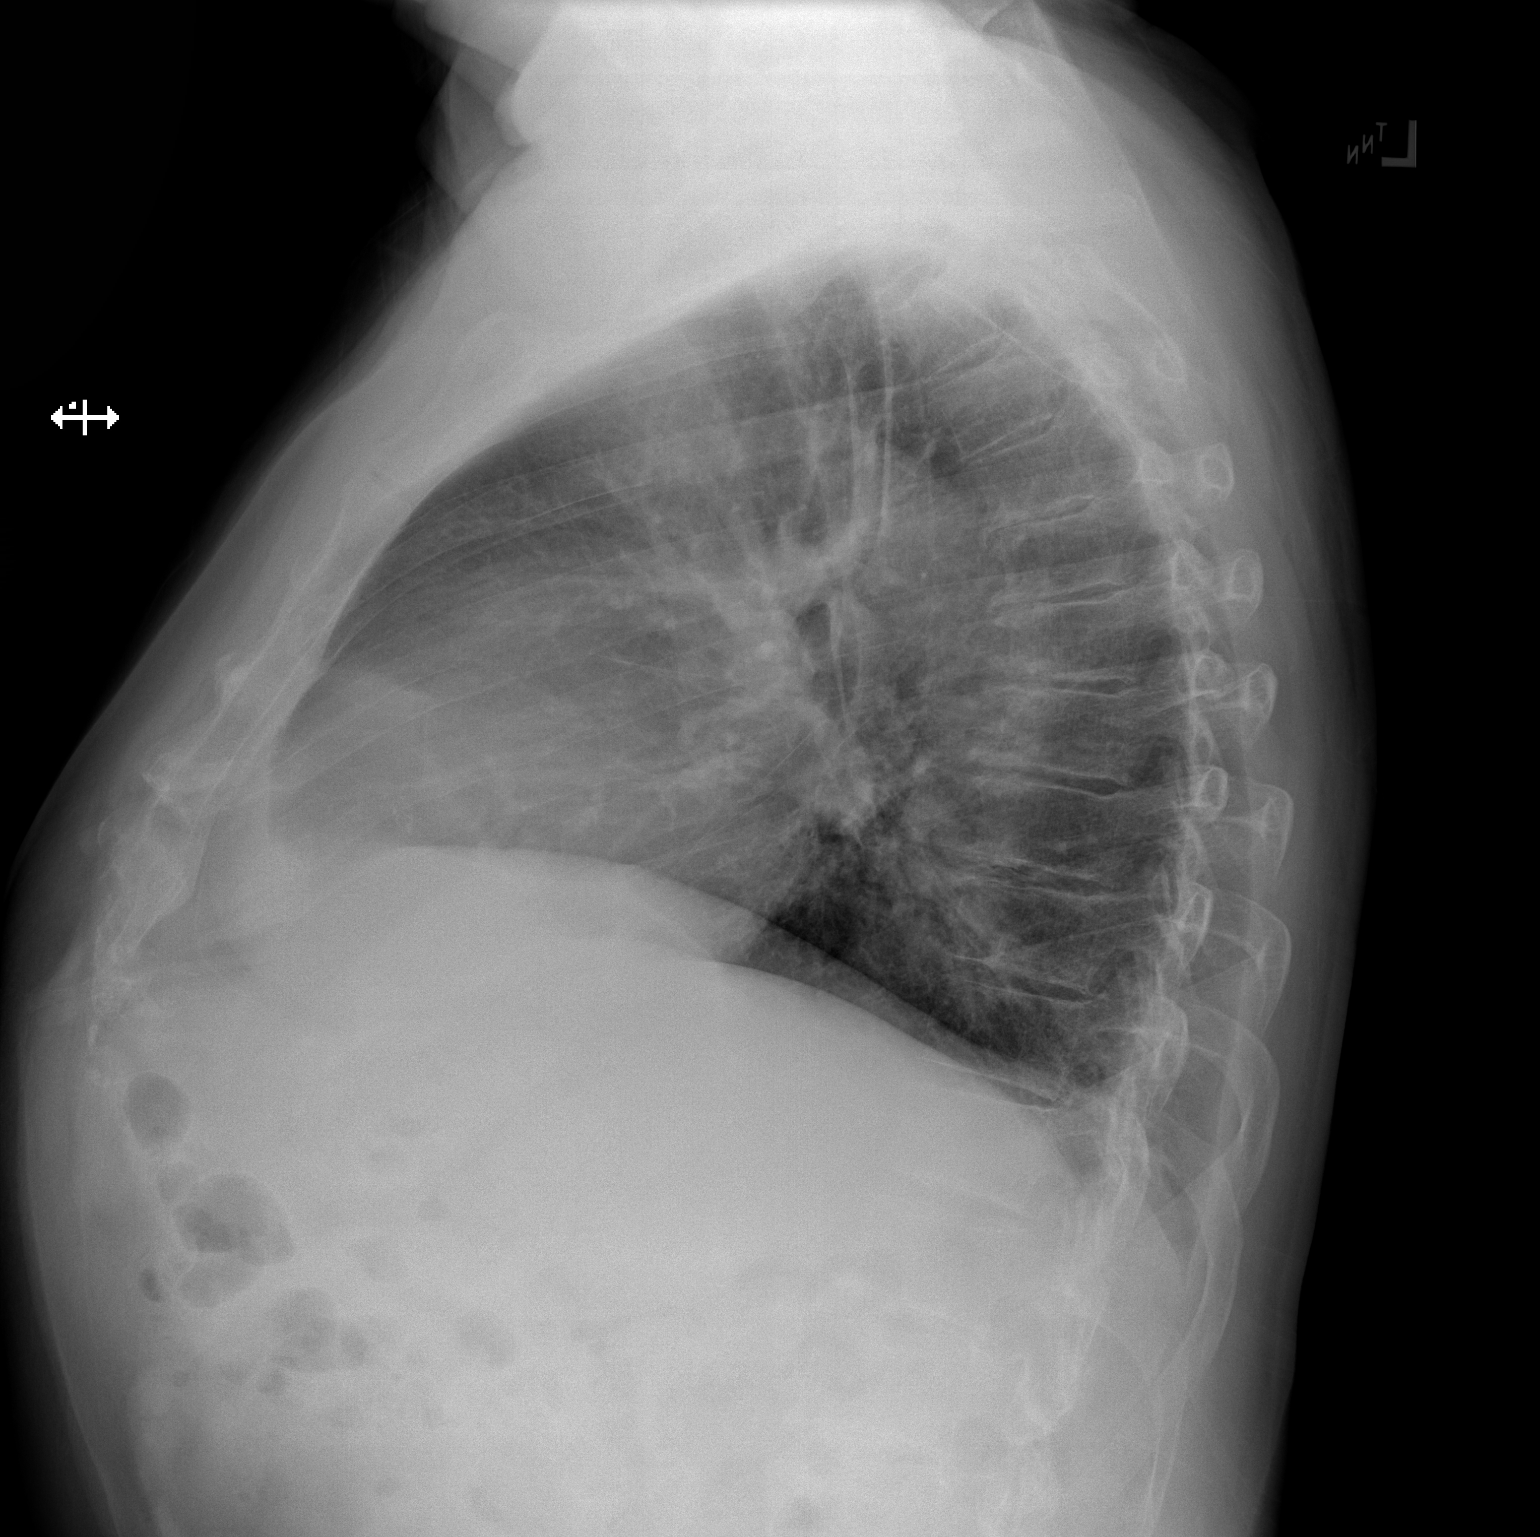

[2 of 2 positions shown; findings below may reference images not displayed]

FINDINGS: No focal airspace disease or effusion. Borderline heart size. No
pneumothorax.
IMPRESSION: No active cardiopulmonary disease.

## 2020-11-09 IMAGING — CT CT HEART MORP W/ CTA COR W/ SCORE W/ CA W/CM &/OR W/O CM
2 of 10 series · 8 of 20 positions shown, 9 images · IV contrast (APPLIED)
Comparison: None.
COMPARISON: None.

Addendum:
EXAM:
OVER-READ INTERPRETATION  CT CHEST

The following report is an over-read performed by radiologist Dr.
Miira Borni [REDACTED] on 11/30/2018. This
over-read does not include interpretation of cardiac or coronary
anatomy or pathology. The coronary calcium score/coronary CTA
interpretation by the cardiologist is attached.
CLINICAL DATA: Aortic stenosis
Cardiac TAVR CT
TECHNIQUE: The patient was scanned on a Siemens Force [REDACTED]ice scanner. A 120
kV retrospective scan was triggered in the descending thoracic aorta
at 111 HU's. Gantry rotation speed was 270 msecs and collimation was
.9 mm. No beta blockade or nitro were given. The 3D data set was
reconstructed in 5% intervals of the R-R cycle. Systolic and
diastolic phases were analyzed on a dedicated work station using
MPR, MIP and VRT modes. The patient received 80 cc of contrast.

[Series 8: 0-90% · axial · 0.39mm/px · z∈[+1025,+1173]mm · 4 of 4090 slices shown, 5 images]
[im 818/4090  vessel]
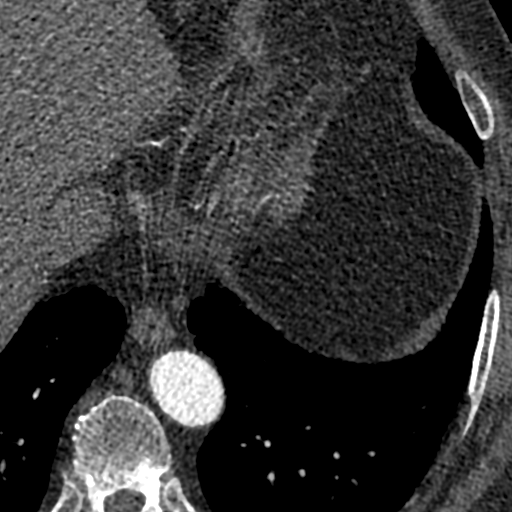
[im 818/4090  lung]
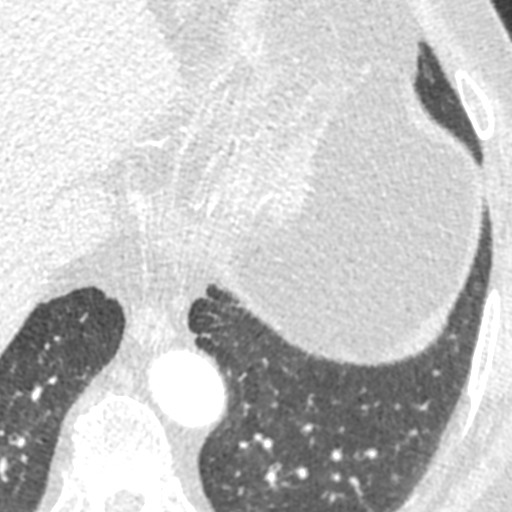
[im 1636/4090  vessel]
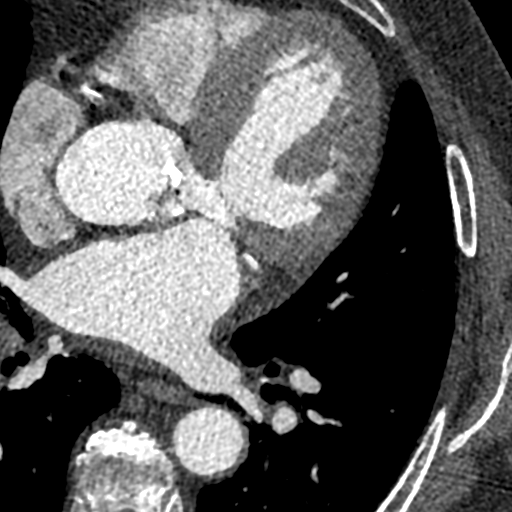
[im 2454/4090  vessel]
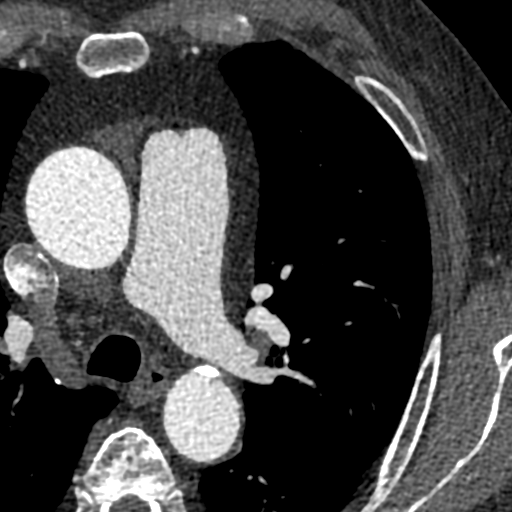
[im 3272/4090  vessel]
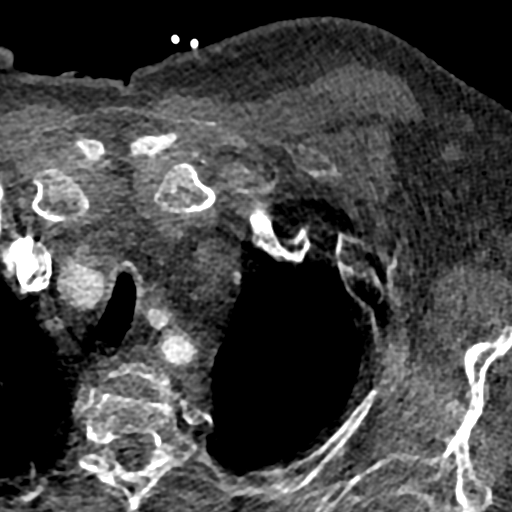

[Series 9: 5-95% · axial · 0.39mm/px · z∈[+1025,+1173]mm · 4 of 4090 slices shown]
[im 818/4090  vessel]
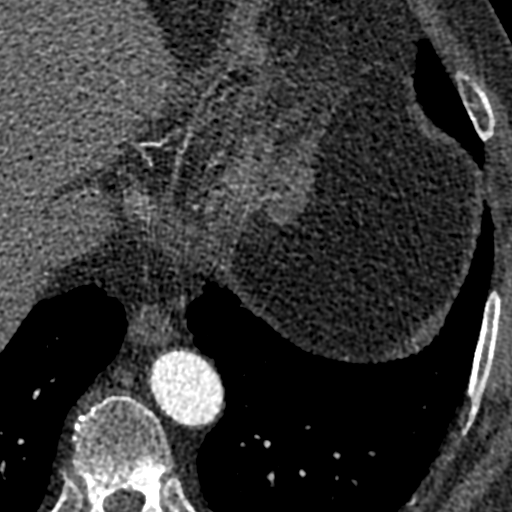
[im 1636/4090  vessel]
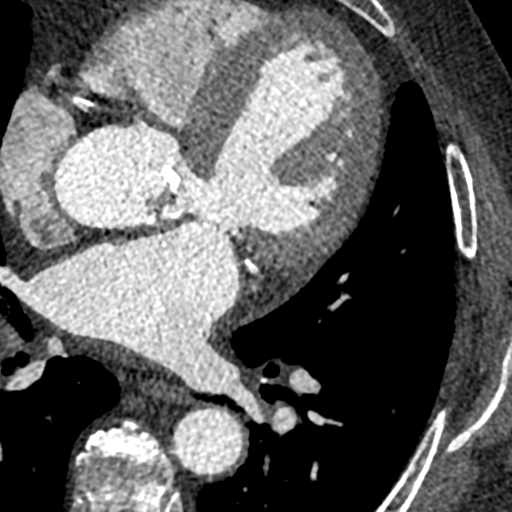
[im 2454/4090  vessel]
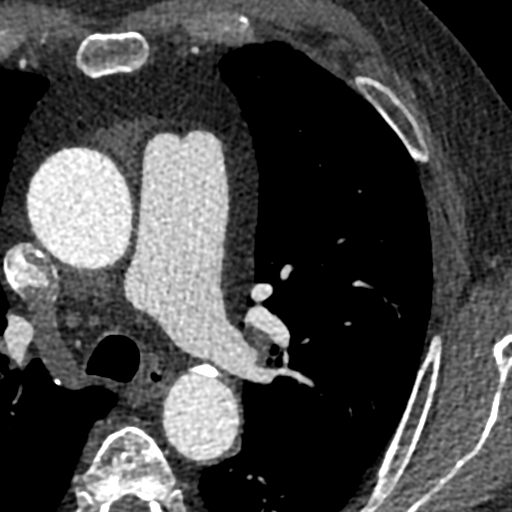
[im 3272/4090  vessel]
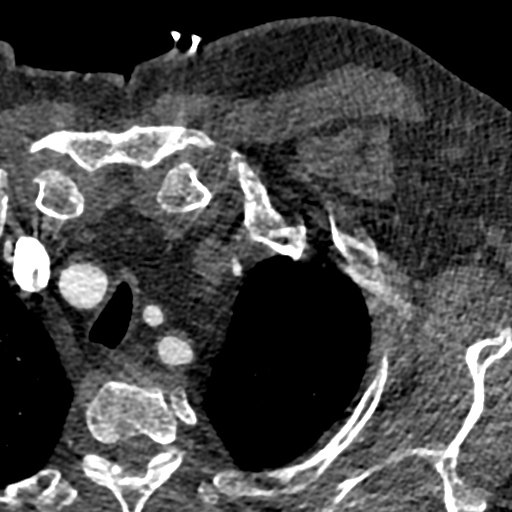

[8 of 20 positions shown; findings below may reference images not displayed]

FINDINGS: Extracardiac findings will be described separately under dictation
for contemporaneously obtained CTA chest, abdomen and pelvis.
IMPRESSION: Please see separate dictation for contemporaneously obtained CTA
chest, abdomen and pelvis dated 11/30/2018 for full description of
relevant extracardiac findings.
FINDINGS: Aortic Valve: Functionally bicuspid with fused right and left cusp.
Severely calcified leaflets with restricted motion

Aorta: Shifted to right lung space moderate aortic root dilatation
with moderate calcific atherosclerosis, normal arch vessels and
moderate aortic root dilatation

Sinotubular Junction: 33 mm

Ascending Thoracic Aorta: 44 mm

Aortic Arch: 30 mm

Descending Thoracic Aorta: 26 mm

Sinus of Valsalva Measurements:

Non-coronary: 38.5 mm

Right - coronary: 40.8 mm

Left - coronary: 38 mm

Coronary Artery Height above Annulus:

Left Main: 17.6 mm above annulus

Right Coronary: 14 mm above annulus

Virtual Basal Annulus Measurements:

Maximum/Minimum Diameter: 30.2 mm x 21.8 mm elliptical

Perimeter: 84 mm

Area: Measured multiple times in 30% phase 522-526 mm2

Coronary Arteries: Sufficient height above annulus for deployment

Optimum Fluoroscopic Angle for Delivery:  AP
IMPRESSION: 1. Functionally bicuspid AV with fused right and left cusps. Annular
area in 30% phase 522-526 mm2 suitable for a 26 mm Sapien 3 valve.
STJ and sinuses suggest 29 mm valve may be possible but annulus is
elliptical with small minor axis and area falls into the 26 mm range

2. Moderate aortic root enlargement 4.4 cm with shift to right lung
space

3.  Coronary arteries sufficient height for deployment

4.  Optimum angle for deployment AP

Fassie Ninela

*** End of Addendum ***
EXAM:
OVER-READ INTERPRETATION  CT CHEST

The following report is an over-read performed by radiologist Dr.
Miira Borni [REDACTED] on 11/30/2018. This
over-read does not include interpretation of cardiac or coronary
anatomy or pathology. The coronary calcium score/coronary CTA
interpretation by the cardiologist is attached.
FINDINGS: Extracardiac findings will be described separately under dictation
for contemporaneously obtained CTA chest, abdomen and pelvis.
IMPRESSION: Please see separate dictation for contemporaneously obtained CTA
chest, abdomen and pelvis dated 11/30/2018 for full description of
relevant extracardiac findings.

## 2020-11-11 IMAGING — CR DG CHEST 2V
2 series · 2 of 2 positions shown · non-contrast
Comparison: 11/30/2018

CLINICAL DATA: Preop evaluation for aortic valve surgery

EXAM:
CHEST - 2 VIEW

[w chest pa]
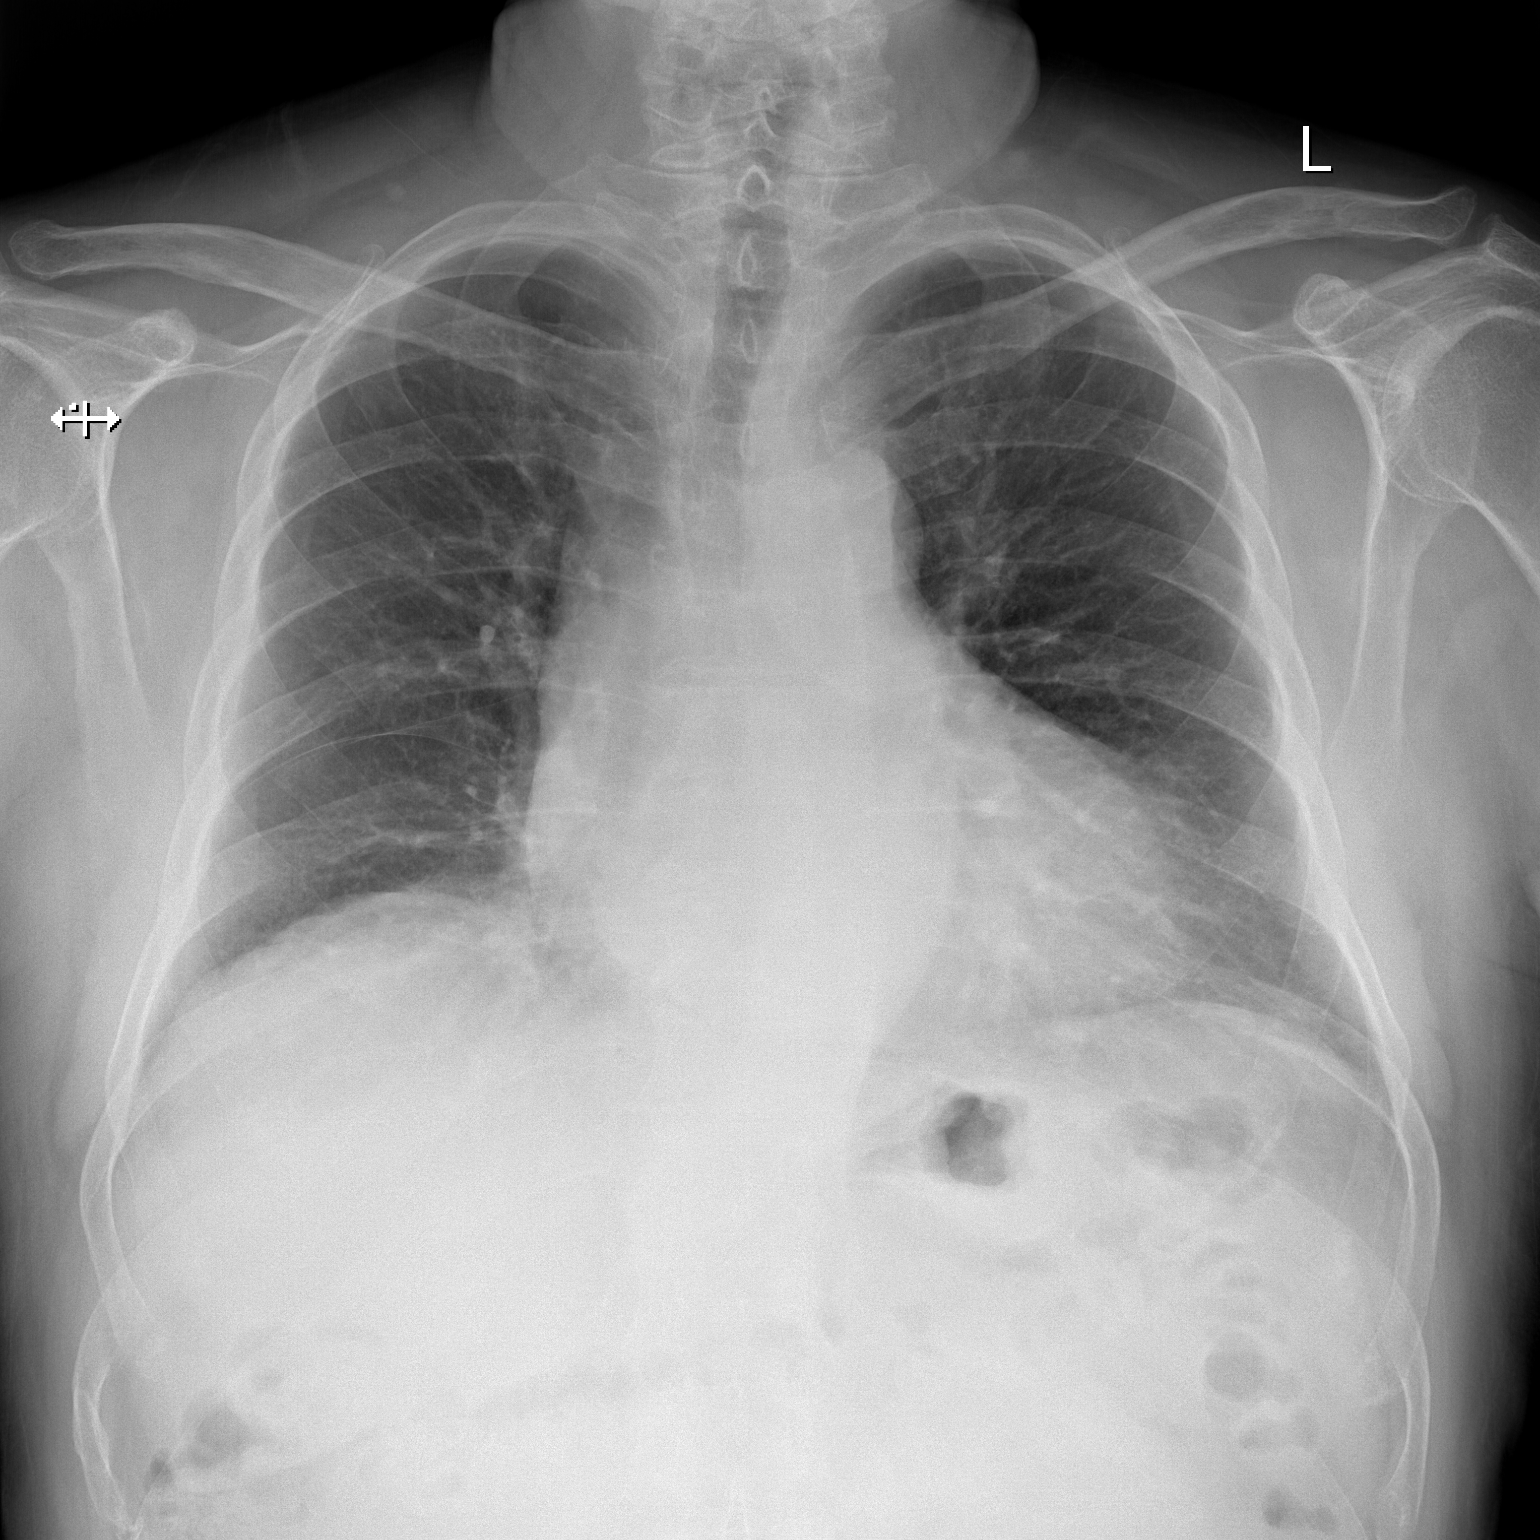

[w chest lat]
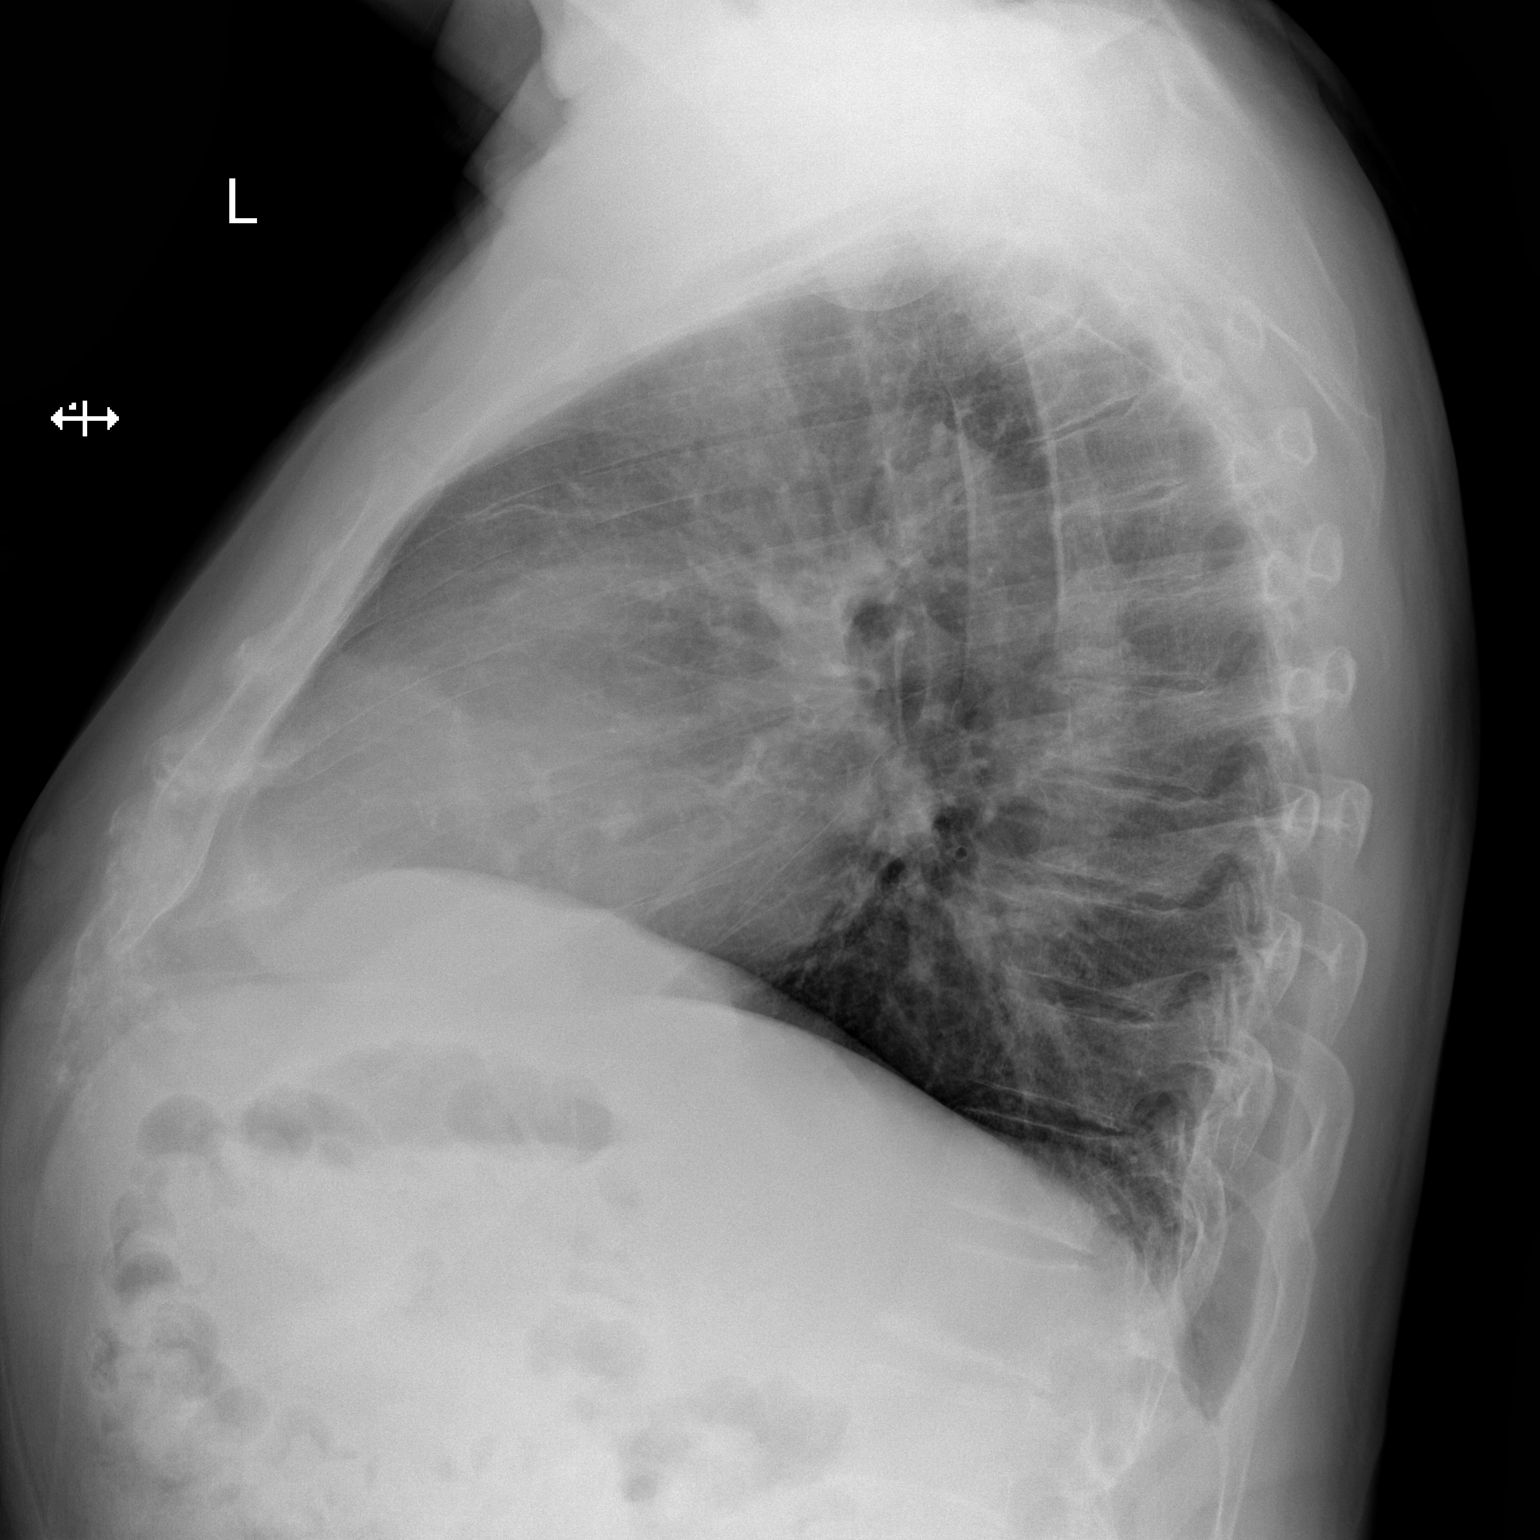

[2 of 2 positions shown; findings below may reference images not displayed]

FINDINGS: Cardiac shadow is at the upper limits of normal in size. No focal
infiltrate is noted. No sizable effusion is seen. No bony
abnormality is noted.
IMPRESSION: No active cardiopulmonary disease.

## 2021-05-13 ENCOUNTER — Other Ambulatory Visit: Payer: Self-pay | Admitting: *Deleted

## 2021-05-13 DIAGNOSIS — C61 Malignant neoplasm of prostate: Secondary | ICD-10-CM

## 2021-05-19 ENCOUNTER — Other Ambulatory Visit: Payer: Self-pay

## 2021-05-19 ENCOUNTER — Inpatient Hospital Stay: Payer: Medicare HMO | Attending: Radiation Oncology

## 2021-05-19 DIAGNOSIS — C61 Malignant neoplasm of prostate: Secondary | ICD-10-CM | POA: Diagnosis not present

## 2021-05-19 LAB — PSA: Prostatic Specific Antigen: 0.16 ng/mL (ref 0.00–4.00)

## 2021-05-26 ENCOUNTER — Encounter: Payer: Self-pay | Admitting: Radiation Oncology

## 2021-05-26 ENCOUNTER — Other Ambulatory Visit: Payer: Self-pay

## 2021-05-26 ENCOUNTER — Ambulatory Visit
Admission: RE | Admit: 2021-05-26 | Discharge: 2021-05-26 | Disposition: A | Discharge status: Home / Self Care | Payer: Medicare HMO | Source: Ambulatory Visit | Attending: Radiation Oncology | Admitting: Radiation Oncology

## 2021-05-26 ENCOUNTER — Other Ambulatory Visit: Payer: Self-pay | Admitting: *Deleted

## 2021-05-26 VITALS — BP 164/78 | HR 53 | Temp 98.3°F | Resp 18 | Ht 70.0 in | Wt 193.7 lb

## 2021-05-26 DIAGNOSIS — Z923 Personal history of irradiation: Secondary | ICD-10-CM | POA: Diagnosis not present

## 2021-05-26 DIAGNOSIS — C61 Malignant neoplasm of prostate: Secondary | ICD-10-CM | POA: Insufficient documentation

## 2021-05-26 DIAGNOSIS — Z08 Encounter for follow-up examination after completed treatment for malignant neoplasm: Secondary | ICD-10-CM | POA: Diagnosis not present

## 2021-05-26 MED ORDER — TAMSULOSIN HCL 0.4 MG PO CAPS: 0.4000 mg | ORAL_CAPSULE | Freq: Every day | ORAL | 12 refills | Status: DC

## 2021-05-26 NOTE — Progress Notes (Signed)
Radiation Oncology ?Follow up Note ? ?Name: Gregory Black   ?Date:   05/26/2021 ?MRN:  [id number] ?DOB: Sep 23, 1945  ? ? ?This 76 y.o. male presents to the clinic today for 4-year follow-up status post I-125 interstitial implant for Gleason 7 adenocarcinoma the prostate. ? ?REFERRING PROVIDER: Kirk Ruths, MD ? ?HPI: Patient is a 76 year old male last seen over 2 years prior.  Seen today in routine follow-up 4 years out status post I-125 interstitial implant for Gleason 7 (3+4) adenocarcinoma the prostate.  He is seen today in routine follow-up and is doing well specifically denies any increased lower urinary tract symptoms diarrhea or fatigue.  Most recent PSA this month was 0.16. ? ?COMPLICATIONS OF TREATMENT: none ? ?FOLLOW UP COMPLIANCE: keeps appointments  ? ?PHYSICAL EXAM:  ?BP (!) 164/78   Pulse (!) 53   Temp 98.3 ?F (36.8 ?C)   Resp 18   Ht '5\' 10"'$  (1.778 m)   Wt 193 lb 11.2 oz (87.9 kg)   BMI 27.79 kg/m?  ?Well-developed well-nourished patient in NAD. HEENT reveals PERLA, EOMI, discs not visualized.  Oral cavity is clear. No oral mucosal lesions are identified. Neck is clear without evidence of cervical or supraclavicular adenopathy. Lungs are clear to A&P. Cardiac examination is essentially unremarkable with regular rate and rhythm without murmur rub or thrill. Abdomen is benign with no organomegaly or masses noted. Motor sensory and DTR levels are equal and symmetric in the upper and lower extremities. Cranial nerves II through XII are grossly intact. Proprioception is intact. No peripheral adenopathy or edema is identified. No motor or sensory levels are noted. Crude visual fields are within normal range. ? ?RADIOLOGY RESULTS: No current films to review ? ?PLAN: Present time patient is now 4 years out with excellent biochemical control of his prostate cancer.  I am pleased with his overall progress.  I have asked to see him back in 1 year for follow-up with a PSA.  Patient is to call with any  concerns. ? ?I would like to take this opportunity to thank you for allowing me to participate in the care of your patient.. ?  ? Noreene Filbert, MD ? ?

## 2021-06-02 DIAGNOSIS — Z0100 Encounter for examination of eyes and vision without abnormal findings: Secondary | ICD-10-CM | POA: Diagnosis not present

## 2021-06-02 DIAGNOSIS — H43813 Vitreous degeneration, bilateral: Secondary | ICD-10-CM | POA: Diagnosis not present

## 2021-07-25 ENCOUNTER — Other Ambulatory Visit: Payer: Self-pay | Admitting: *Deleted

## 2021-07-27 MED ORDER — TAMSULOSIN HCL 0.4 MG PO CAPS: 0.4000 mg | ORAL_CAPSULE | Freq: Every day | ORAL | 3 refills | Status: DC

## 2021-08-22 DIAGNOSIS — Z0000 Encounter for general adult medical examination without abnormal findings: Secondary | ICD-10-CM | POA: Diagnosis not present

## 2021-08-22 DIAGNOSIS — I700 Atherosclerosis of aorta: Secondary | ICD-10-CM | POA: Diagnosis not present

## 2021-08-22 DIAGNOSIS — I7121 Aneurysm of the ascending aorta, without rupture: Secondary | ICD-10-CM | POA: Diagnosis not present

## 2021-08-22 DIAGNOSIS — E538 Deficiency of other specified B group vitamins: Secondary | ICD-10-CM | POA: Diagnosis not present

## 2021-08-22 DIAGNOSIS — L989 Disorder of the skin and subcutaneous tissue, unspecified: Secondary | ICD-10-CM | POA: Diagnosis not present

## 2021-08-22 DIAGNOSIS — Z1389 Encounter for screening for other disorder: Secondary | ICD-10-CM | POA: Diagnosis not present

## 2021-08-26 ENCOUNTER — Other Ambulatory Visit: Payer: Self-pay | Admitting: Internal Medicine

## 2021-08-26 DIAGNOSIS — I7121 Aneurysm of the ascending aorta, without rupture: Secondary | ICD-10-CM

## 2021-09-25 DIAGNOSIS — M5136 Other intervertebral disc degeneration, lumbar region: Secondary | ICD-10-CM | POA: Diagnosis not present

## 2021-09-26 DIAGNOSIS — M5126 Other intervertebral disc displacement, lumbar region: Secondary | ICD-10-CM | POA: Diagnosis not present

## 2021-09-26 DIAGNOSIS — I700 Atherosclerosis of aorta: Secondary | ICD-10-CM | POA: Diagnosis not present

## 2021-09-26 DIAGNOSIS — S32030A Wedge compression fracture of third lumbar vertebra, initial encounter for closed fracture: Secondary | ICD-10-CM | POA: Diagnosis not present

## 2021-11-18 DIAGNOSIS — M5136 Other intervertebral disc degeneration, lumbar region: Secondary | ICD-10-CM | POA: Diagnosis not present

## 2021-11-18 DIAGNOSIS — R252 Cramp and spasm: Secondary | ICD-10-CM | POA: Diagnosis not present

## 2021-12-10 DIAGNOSIS — M5136 Other intervertebral disc degeneration, lumbar region: Secondary | ICD-10-CM | POA: Diagnosis not present

## 2021-12-12 DIAGNOSIS — M5136 Other intervertebral disc degeneration, lumbar region: Secondary | ICD-10-CM | POA: Diagnosis not present

## 2021-12-16 DIAGNOSIS — M5136 Other intervertebral disc degeneration, lumbar region: Secondary | ICD-10-CM | POA: Diagnosis not present

## 2021-12-23 ENCOUNTER — Other Ambulatory Visit (INDEPENDENT_AMBULATORY_CARE_PROVIDER_SITE_OTHER): Payer: Self-pay | Admitting: Nurse Practitioner

## 2021-12-23 DIAGNOSIS — M5136 Other intervertebral disc degeneration, lumbar region: Secondary | ICD-10-CM | POA: Diagnosis not present

## 2021-12-23 DIAGNOSIS — R252 Cramp and spasm: Secondary | ICD-10-CM

## 2021-12-25 ENCOUNTER — Ambulatory Visit (INDEPENDENT_AMBULATORY_CARE_PROVIDER_SITE_OTHER): Payer: Medicare HMO

## 2021-12-25 ENCOUNTER — Encounter (INDEPENDENT_AMBULATORY_CARE_PROVIDER_SITE_OTHER): Payer: Self-pay | Admitting: Nurse Practitioner

## 2021-12-25 ENCOUNTER — Ambulatory Visit (INDEPENDENT_AMBULATORY_CARE_PROVIDER_SITE_OTHER): Payer: Medicare HMO | Admitting: Nurse Practitioner

## 2021-12-25 VITALS — BP 162/72 | HR 50 | Resp 16 | Wt 192.4 lb

## 2021-12-25 DIAGNOSIS — R252 Cramp and spasm: Secondary | ICD-10-CM | POA: Diagnosis not present

## 2021-12-25 DIAGNOSIS — G629 Polyneuropathy, unspecified: Secondary | ICD-10-CM | POA: Diagnosis not present

## 2021-12-25 DIAGNOSIS — M5136 Other intervertebral disc degeneration, lumbar region: Secondary | ICD-10-CM | POA: Diagnosis not present

## 2021-12-26 ENCOUNTER — Encounter (INDEPENDENT_AMBULATORY_CARE_PROVIDER_SITE_OTHER): Payer: Self-pay | Admitting: Nurse Practitioner

## 2021-12-26 DIAGNOSIS — M5136 Other intervertebral disc degeneration, lumbar region: Secondary | ICD-10-CM | POA: Diagnosis not present

## 2021-12-26 NOTE — Progress Notes (Signed)
Subjective:    Patient ID: Gregory Black, male    DOB: 1945/05/02, 76 y.o.   MRN: [id number] Chief Complaint  Patient presents with   New Patient (Initial Visit)    Ref Meeler consult DDD,leg cramps,numbness,moderate pain with walking    Gregory Black is a 76 year old male who presents today for follow-up evaluation regarding cramps in his lower legs.  He notes that sometimes they are in the thighs sometimes in the calf never always in a predictable place.  He also endorses having numbness in his feet.  He notes that the numbness in his feet is somewhat improved by him taking B12 daily.  He notes that his cramps are worse when he lays on his right side and he needs to straighten in order to relieve some of the pain.  He also notes that when he lifts weight he has pain in his back as well.  When he lifts weight the pain in his back is worse on the right side of his back.  The pain the patient endorses that his Is not quite consistent with claudication-like symptoms.  Neurologic symptoms consistent with rest pain like symptoms.  Today, noninvasive studies show a right ABI of 1.29 and a left 1.46.  TBI 0.76 on the right and 0.93 on the left.  Patient has strong triphasic tibial artery waveforms bilaterally with normal toe waveforms bilaterally.    Review of Systems  Musculoskeletal:  Positive for arthralgias and back pain.  Neurological:  Positive for numbness.  All other systems reviewed and are negative.      Objective:   Physical Exam Vitals reviewed.  HENT:     Head: Normocephalic.  Cardiovascular:     Rate and Rhythm: Normal rate.     Pulses:          Dorsalis pedis pulses are 1+ on the right side and 1+ on the left side.       Posterior tibial pulses are detected w/ Doppler on the right side and detected w/ Doppler on the left side.  Pulmonary:     Effort: Pulmonary effort is normal.  Skin:    General: Skin is warm and dry.  Neurological:     Mental Status: He is alert and  oriented to person, place, and time.  Psychiatric:        Mood and Affect: Mood normal.        Behavior: Behavior normal.        Thought Content: Thought content normal.        Judgment: Judgment normal.     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 12/06/18: s/p TAVR with a 26 mm Edwards S3U THV via the TF approach   Severe aortic stenosis    Sinus bradycardia    Thoracic ascending aortic aneurysm ([place])    4.4 cm 11/30/18 CTA   Wears dentures    full upper and lower    Social History   Socioeconomic History   Marital status: Married  Spouse name: Not on file   Number of children: Not on file   Years of education: Not on file   Highest education level: Not on file  Occupational History   Not on file  Tobacco Use   Smoking status: Former    Types: Cigarettes    Quit date: 12/02/1998    Years since quitting: 23.0   Smokeless tobacco: Never   Tobacco comments:    quit approx 2010  Vaping Use   Vaping Use: Never used  Substance and Sexual Activity   Alcohol use: Yes    Alcohol/week: 1.0 standard drink of alcohol    Types: 1 Cans of beer per week    Comment: 1 beer a week   Drug use: No   Sexual activity: Not on file  Other Topics Concern   Not on file  Social History Narrative   Not on file   Social Determinants of Health   Financial Resource Strain: Not on file  Food Insecurity: Not on file  Transportation Needs: Not on file  Physical Activity: Not on file  Stress: Not on file  Social Connections: Not on file  Intimate Partner Violence: Not on file    Past Surgical History:  Procedure Laterality Date    CARDIAC CATHETERIZATION     No significant CAD 10/2018   CATARACT EXTRACTION Left 2015   COLONOSCOPY     COLONOSCOPY WITH PROPOFOL N/A 05/17/2015   Procedure: COLONOSCOPY WITH PROPOFOL;  Surgeon: Lucilla Lame, MD;  Location: [facility];  Service: Endoscopy;  Laterality: N/A;   EYE SURGERY     KNEE ARTHROSCOPY W/ MENISCAL REPAIR Right    RADIOACTIVE SEED IMPLANT N/A 07/26/2017   Procedure: RADIOACTIVE SEED IMPLANT/BRACHYTHERAPY IMPLANT;  Surgeon: Hollice Espy, MD;  Location: [facility];  Service: Urology;  Laterality: N/A;   RIGHT/LEFT HEART CATH AND CORONARY ANGIOGRAPHY Bilateral 11/15/2018   Procedure: RIGHT/LEFT HEART CATH AND CORONARY ANGIOGRAPHY;  Surgeon: Teodoro Spray, MD;  Location: [facility];  Service: Cardiovascular;  Laterality: Bilateral;   TEE WITHOUT CARDIOVERSION N/A 12/06/2018   Procedure: TRANSESOPHAGEAL ECHOCARDIOGRAM (TEE);  Surgeon: Sherren Mocha, MD;  Location: [facility];  Service: Open Heart Surgery;  Laterality: N/A;   TONSILLECTOMY     TONSILLECTOMY AND ADENOIDECTOMY     TRANSCATHETER AORTIC VALVE REPLACEMENT, TRANSFEMORAL N/A 12/06/2018   Procedure: TRANSCATHETER AORTIC VALVE REPLACEMENT, TRANSFEMORAL approach with insertion of 15m ultra sapien edwards valve;  Surgeon: CSherren Mocha MD;  [place] [facility]  Service: Open Heart Surgery;  Laterality: N/A;    Family History  Problem Relation Age of Onset   Heart disease Mother    Stroke Mother    Diabetes Mother        high blood sugar   Hypertension Mother    Cancer Father    Arthritis Father    Cancer Brother        throat   Diabetes Brother    Lung disease Brother        asbestos    Allergies  Allergen Reactions   Vicodin [Hydrocodone-Acetaminophen] Other (See Comments)    Unable to void or empty bowels   Pollen Extract Other (See Comments)    Sneezing, runny eyes       Latest Ref Rng & Units 12/07/2018    2:24 AM 12/06/2018    1:26 PM 12/06/2018    1:18 PM  CBC  WBC 4.0 - 10.5  K/uL 6.6     Hemoglobin 13.0 - 17.0 g/dL 12.9  11.6  6.5   Hematocrit 39.0 - 52.0 % 35.9  34.0  19.0   Platelets 150 - 400 K/uL 135         CMP     Component Value Date/Time   NA 142 01/05/2019 1555   K 4.0 01/05/2019 1555   CL 102 01/05/2019 1555   CO2 25 01/05/2019 1555   GLUCOSE 93 01/05/2019 1555   GLUCOSE 104 (H) 12/07/2018 0224   BUN 17 01/05/2019 1555   CREATININE 1.22 01/05/2019 1555   CALCIUM 9.2 01/05/2019 1555   PROT 6.9 12/02/2018 1152   PROT 6.7 04/03/2015 1449   ALBUMIN 3.8 12/02/2018 1152   ALBUMIN 4.1 04/03/2015 1449   AST 18 12/02/2018 1152   ALT 12 12/02/2018 1152   ALKPHOS 59 12/02/2018 1152   BILITOT 0.6 12/02/2018 1152   BILITOT 0.4 04/03/2015 1449   GFRNONAA 58 (L) 01/05/2019 1555   GFRAA 67 01/05/2019 1555     VAS [place] ABI WITH/WO TBI  Result Date: 12/25/2021  LOWER EXTREMITY DOPPLER STUDY Patient Name:  ORACIO GALEN  Date of Exam:   12/25/2021 Medical Rec #: [id number]         Accession #:    [id number] Date of Birth: 09/06/1945         Patient Gender: M Patient Age:   75 years Exam Location:  [facility] Procedure:      VAS [place] ABI WITH/WO TBI Referring Phys: Hortencia Pilar --------------------------------------------------------------------------------  Indications: Claudication. Cramping in legs  Performing Technologist: Almira Coaster RVS  Examination Guidelines: A complete evaluation includes at minimum, Doppler waveform signals and systolic blood pressure reading at the level of bilateral brachial, anterior tibial, and posterior tibial arteries, when vessel segments are accessible. Bilateral testing is considered an integral part of a complete examination. Photoelectric Plethysmograph (PPG) waveforms and toe systolic pressure readings are included as required and additional duplex testing as needed. Limited examinations for reoccurring indications may be performed as noted.  ABI Findings:  +---------+------------------+-----+---------+--------+ Right    Rt Pressure (mmHg)IndexWaveform Comment  +---------+------------------+-----+---------+--------+ Brachial 160                                      +---------+------------------+-----+---------+--------+ ATA      217               1.29 triphasic         +---------+------------------+-----+---------+--------+ PTA      188               1.12 triphasic         +---------+------------------+-----+---------+--------+ Great Toe127               0.76                   +---------+------------------+-----+---------+--------+ +---------+------------------+-----+---------+-------+ Left     Lt Pressure (mmHg)IndexWaveform Comment +---------+------------------+-----+---------+-------+ Brachial 168                                     +---------+------------------+-----+---------+-------+ ATA      197               1.17 triphasic        +---------+------------------+-----+---------+-------+ PTA      246               1.46 triphasic        +---------+------------------+-----+---------+-------+  Great Toe157               0.93                  +---------+------------------+-----+---------+-------+ +-------+-----------+-----------+------------+------------+ ABI/TBIToday's ABIToday's TBIPrevious ABIPrevious TBI +-------+-----------+-----------+------------+------------+ Right  1.29       .76                                 +-------+-----------+-----------+------------+------------+ Left   1.46       .93                                 +-------+-----------+-----------+------------+------------+  Summary: Right: Resting right ankle-brachial index is within normal range. The right toe-brachial index is normal. Left: Resting left ankle-brachial index is within normal range. The left toe-brachial index is normal. *See table(s) above for measurements and observations.  Electronically signed by Hortencia Pilar MD on 12/25/2021 at 5:05:04 PM.    Final        Assessment & Plan:   1. Leg cramps Recommend:  The patient has atypical pain symptoms for vascular disease and on exam I do not find evidence of vascular pathology that would explain the patient's symptoms.  Noninvasive studies do not identify significant vascular problems  I suspect the patient is c/o pseudoclaudication.  Patient should have an evaluation of the LS spine which I defer to the primary service or the Spine service.  The patient should continue walking and begin a more formal exercise program. The patient should continue his antiplatelet therapy and aggressive treatment of the lipid abnormalities.  Patient will follow-up with me on a PRN basis.  - VAS [place] ABI WITH/WO TBI  2. L4-L5 disc bulge Based upon the patient's noninvasive studies no significant peripheral arterial disease.  This is a likely cause of his lower extremity symptoms.  It was noted by the referring provider,Ms.Meeler, NP that further evaluation of his lumbar spine and MRI can be done for further work-up.  Based on lack of peripheral arterial disease on noninvasive studies, would recommend follow-up with his office for further work-up as I believe that it is largely related to his lower back.  3. Neuropathy This is likely the source of the patient's numbness and tingling in his lower extremities.   Current Outpatient Medications on File Prior to Visit  Medication Sig Dispense Refill   aspirin EC 81 MG tablet Take 1 tablet (81 mg total) by mouth daily. 90 tablet 3   Multiple Vitamin (MULTIVITAMIN WITH MINERALS) TABS tablet Take 1 tablet by mouth daily.     Omega-3 Fatty Acids (FISH OIL) 1000 MG CAPS Take 1,000 mg by mouth daily.     tamsulosin (FLOMAX) 0.4 MG CAPS capsule Take 1 capsule (0.4 mg total) by mouth daily after supper. 90 capsule 3   triamterene-hydrochlorothiazide (MAXZIDE-25) 37.5-25 MG tablet Take 1 tablet by mouth daily. 90 tablet 3    vitamin B-12 (CYANOCOBALAMIN) 1000 MCG tablet Take 1,000 mcg by mouth daily.     Current Facility-Administered Medications on File Prior to Visit  Medication Dose Route Frequency Provider Last Rate Last Admin   sodium chloride flush (NS) 0.9 % injection 3 mL  3 mL Intravenous Q12H Teodoro Spray, MD        There are no Patient Instructions on file for this visit. No follow-ups on file.   Lucretia Roers  Owens Shark, NP

## 2021-12-30 DIAGNOSIS — M5136 Other intervertebral disc degeneration, lumbar region: Secondary | ICD-10-CM | POA: Diagnosis not present

## 2022-01-05 DIAGNOSIS — M5136 Other intervertebral disc degeneration, lumbar region: Secondary | ICD-10-CM | POA: Diagnosis not present

## 2022-01-09 DIAGNOSIS — M5136 Other intervertebral disc degeneration, lumbar region: Secondary | ICD-10-CM | POA: Diagnosis not present

## 2022-02-25 ENCOUNTER — Ambulatory Visit: Payer: Medicare HMO | Admitting: Dermatology

## 2022-06-03 ENCOUNTER — Other Ambulatory Visit: Payer: Self-pay | Admitting: Radiation Oncology

## 2022-08-26 DIAGNOSIS — H11003 Unspecified pterygium of eye, bilateral: Secondary | ICD-10-CM | POA: Diagnosis not present

## 2022-08-26 DIAGNOSIS — H5203 Hypermetropia, bilateral: Secondary | ICD-10-CM | POA: Diagnosis not present

## 2022-08-26 DIAGNOSIS — H52223 Regular astigmatism, bilateral: Secondary | ICD-10-CM | POA: Diagnosis not present

## 2022-09-11 ENCOUNTER — Other Ambulatory Visit: Payer: Self-pay | Admitting: *Deleted

## 2022-09-11 DIAGNOSIS — H11032 Double pterygium of left eye: Secondary | ICD-10-CM | POA: Diagnosis not present

## 2022-09-11 DIAGNOSIS — H02051 Trichiasis without entropian right upper eyelid: Secondary | ICD-10-CM | POA: Diagnosis not present

## 2022-09-11 MED ORDER — TAMSULOSIN HCL 0.4 MG PO CAPS: 0.4000 mg | ORAL_CAPSULE | Freq: Every day | ORAL | 3 refills | Status: AC

## 2022-09-17 DIAGNOSIS — I2510 Atherosclerotic heart disease of native coronary artery without angina pectoris: Secondary | ICD-10-CM | POA: Diagnosis not present

## 2022-09-17 DIAGNOSIS — Z0000 Encounter for general adult medical examination without abnormal findings: Secondary | ICD-10-CM | POA: Diagnosis not present

## 2022-09-17 DIAGNOSIS — I7121 Aneurysm of the ascending aorta, without rupture: Secondary | ICD-10-CM | POA: Diagnosis not present

## 2022-09-17 DIAGNOSIS — Z1331 Encounter for screening for depression: Secondary | ICD-10-CM | POA: Diagnosis not present

## 2022-09-17 DIAGNOSIS — E538 Deficiency of other specified B group vitamins: Secondary | ICD-10-CM | POA: Diagnosis not present

## 2022-09-18 ENCOUNTER — Other Ambulatory Visit: Payer: Self-pay

## 2022-09-18 DIAGNOSIS — Z0000 Encounter for general adult medical examination without abnormal findings: Secondary | ICD-10-CM

## 2022-09-18 DIAGNOSIS — I7121 Aneurysm of the ascending aorta, without rupture: Secondary | ICD-10-CM

## 2022-09-22 DIAGNOSIS — H11032 Double pterygium of left eye: Secondary | ICD-10-CM | POA: Diagnosis not present

## 2022-09-29 ENCOUNTER — Ambulatory Visit
Admission: RE | Admit: 2022-09-29 | Discharge: 2022-09-29 | Disposition: A | Discharge status: Home / Self Care | Payer: Medicare HMO | Source: Ambulatory Visit | Attending: Internal Medicine | Admitting: Internal Medicine

## 2022-09-29 ENCOUNTER — Encounter: Payer: Self-pay | Admitting: Ophthalmology

## 2022-09-29 DIAGNOSIS — Z0000 Encounter for general adult medical examination without abnormal findings: Secondary | ICD-10-CM | POA: Insufficient documentation

## 2022-09-29 DIAGNOSIS — I7121 Aneurysm of the ascending aorta, without rupture: Secondary | ICD-10-CM | POA: Insufficient documentation

## 2022-10-01 ENCOUNTER — Encounter: Payer: Self-pay | Admitting: Ophthalmology

## 2022-10-01 NOTE — Discharge Instructions (Signed)

## 2022-10-01 NOTE — Anesthesia Preprocedure Evaluation (Addendum)
Anesthesia Evaluation  Patient identified by MRN, date of birth, ID band Patient awake    Reviewed: Allergy & Precautions, H&P , NPO status , Patient's Chart, lab work & pertinent test results  Airway Mallampati: III  TM Distance: >3 FB Neck ROM: Full    Dental no notable dental hx. (+) Edentulous Upper, Edentulous Lower Removed his dentures :   Pulmonary former smoker   Pulmonary exam normal breath sounds clear to auscultation       Cardiovascular negative cardio ROS Normal cardiovascular exam Rhythm:Regular Rate:Normal  Sept 2020 note from cardiology office Dr. Einar Crow III: Zio patch that revealed predominately sinus rhythm with a heart rate of 60bpm with occasional PVCs and one 4 beat run of NSVT. There were a few episodes of high-grade heart block, second degree type II, which were both transient in nature. Sinus pauses detected up to 3.6 seconds.  He has occasional episodes of dizziness when heart rate is on the lower side, but these episodes are short in duration. He remains very active, running on a regular basis without significant difficulty.  Echocardiogram on 01/05/2019 revealed normal RV and LV systolic function with an EF estimated between 55 and 60% with post TAVR with 26 mm Sapien valve. Trivial AR persists. The SA view is a bit unusual with an area of dense calcium behind valve near right and left commisure and sinuses are dilated. He has a known ascending thoracic aneurysm, measured at 4.4cm, in which he declined consideration for a SAVR with replacement of the proximal ascending thoracic aorta during the time of TAVR workup.   Neuro/Psych  Neuromuscular disease  negative psych ROS   GI/Hepatic Neg liver ROS, hiatal hernia,GERD  ,,  Endo/Other  negative endocrine ROS    Renal/GU Renal diseasenegative Renal ROS  negative genitourinary   Musculoskeletal  (+) Arthritis ,    Abdominal   Peds negative  pediatric ROS (+)  Hematology negative hematology ROS (+)   Anesthesia Other Findings Neuropathy  Bulging lumbar disc Arthritis  Wears dentures History of hiatal hernia  Thoracic ascending aortic aneurysm  History of prostate cancer Sinus bradycardia  GERD (gastroesophageal reflux disease) Had severe aortic stenosis, but now is S/P TAVR (transcatheter aortic valve replacement)  Wears hearing aid in both ears    Reproductive/Obstetrics negative OB ROS                             Anesthesia Physical Anesthesia Plan  ASA: 3  Anesthesia Plan: MAC   Post-op Pain Management:    Induction: Intravenous  PONV Risk Score and Plan:   Airway Management Planned: Natural Airway and Nasal Cannula  Additional Equipment:   Intra-op Plan:   Post-operative Plan:   Informed Consent: I have reviewed the patients History and Physical, chart, labs and discussed the procedure including the risks, benefits and alternatives for the proposed anesthesia with the patient or authorized representative who has indicated his/her understanding and acceptance.     Dental Advisory Given  Plan Discussed with: Anesthesiologist, CRNA and Surgeon  Anesthesia Plan Comments: (Patient consented for risks of anesthesia including but not limited to:  - adverse reactions to medications - damage to eyes, teeth, lips or other oral mucosa - nerve damage due to positioning  - sore throat or hoarseness - Damage to heart, brain, nerves, lungs, other parts of body or loss of life  Patient voiced understanding.)        Anesthesia Quick  Evaluation

## 2022-10-06 ENCOUNTER — Ambulatory Visit: Payer: Medicare HMO | Admitting: Anesthesiology

## 2022-10-06 ENCOUNTER — Other Ambulatory Visit: Payer: Self-pay

## 2022-10-06 ENCOUNTER — Ambulatory Visit
Admission: RE | Admit: 2022-10-06 | Discharge: 2022-10-06 | Disposition: A | Discharge status: Home / Self Care | Payer: Medicare HMO | Attending: Ophthalmology | Admitting: Ophthalmology

## 2022-10-06 ENCOUNTER — Encounter: Payer: Self-pay | Admitting: Ophthalmology

## 2022-10-06 ENCOUNTER — Encounter
Admission: RE | Disposition: A | Discharge status: Home / Self Care | Payer: Self-pay | Source: Home / Self Care | Attending: Ophthalmology

## 2022-10-06 DIAGNOSIS — Z87891 Personal history of nicotine dependence: Secondary | ICD-10-CM | POA: Insufficient documentation

## 2022-10-06 DIAGNOSIS — I5033 Acute on chronic diastolic (congestive) heart failure: Secondary | ICD-10-CM | POA: Diagnosis not present

## 2022-10-06 DIAGNOSIS — H11032 Double pterygium of left eye: Secondary | ICD-10-CM | POA: Insufficient documentation

## 2022-10-06 HISTORY — DX: Polyneuropathy, unspecified: G62.9

## 2022-10-06 HISTORY — PX: PTERYGIUM EXCISION: SHX2273

## 2022-10-06 HISTORY — DX: Fatty (change of) liver, not elsewhere classified: K76.0

## 2022-10-06 HISTORY — DX: Presence of external hearing-aid: Z97.4

## 2022-10-06 SURGERY — EXCISION, PTERYGIUM
Anesthesia: Monitor Anesthesia Care | Site: Eye | Laterality: Left

## 2022-10-06 MED ORDER — MIDAZOLAM HCL 2 MG/2ML IJ SOLN
INTRAMUSCULAR | Status: DC | PRN
  Administered 2022-10-06: 2 mg via INTRAVENOUS

## 2022-10-06 MED ORDER — TETRACAINE HCL 0.5 % OP SOLN
1.0000 [drp] | OPHTHALMIC | Status: DC | PRN
  Administered 2022-10-06 (×3): 1 [drp] via OPHTHALMIC

## 2022-10-06 MED ORDER — LIDOCAINE-EPINEPHRINE 2 %-1:100000 IJ SOLN
INTRAMUSCULAR | Status: DC | PRN
  Administered 2022-10-06: 2 mL via INTRADERMAL

## 2022-10-06 MED ORDER — GLYCOPYRROLATE 0.2 MG/ML IJ SOLN
INTRAMUSCULAR | Status: DC | PRN
  Administered 2022-10-06: .2 mg via INTRAVENOUS

## 2022-10-06 MED ORDER — LACTATED RINGERS IV SOLN: INTRAVENOUS | Status: DC

## 2022-10-06 MED ORDER — NEOMYCIN-POLYMYXIN-DEXAMETH 3.5-10000-0.1 OP OINT
TOPICAL_OINTMENT | OPHTHALMIC | Status: DC | PRN
  Administered 2022-10-06: 1 via OPHTHALMIC

## 2022-10-06 MED ORDER — STERILE WATER FOR IRRIGATION IR SOLN
Status: DC | PRN
  Administered 2022-10-06: 500 mL

## 2022-10-06 MED ORDER — FENTANYL CITRATE (PF) 100 MCG/2ML IJ SOLN
INTRAMUSCULAR | Status: DC | PRN
  Administered 2022-10-06: 50 ug via INTRAVENOUS
  Administered 2022-10-06: 25 ug via INTRAVENOUS
  Administered 2022-10-06: 50 ug via INTRAVENOUS
  Administered 2022-10-06: 25 ug via INTRAVENOUS
  Administered 2022-10-06: 50 ug via INTRAVENOUS

## 2022-10-06 MED ORDER — PHENYLEPHRINE HCL 10 % OP SOLN
1.0000 [drp] | OPHTHALMIC | Status: DC | PRN
  Administered 2022-10-06: 3 [drp] via OPHTHALMIC

## 2022-10-06 MED ORDER — PROPOFOL 10 MG/ML IV BOLUS
INTRAVENOUS | Status: DC | PRN
  Administered 2022-10-06: 50 ug via INTRAVENOUS

## 2022-10-06 MED ORDER — LIDOCAINE HCL 2 % IJ SOLN
INTRAMUSCULAR | Status: DC | PRN
  Administered 2022-10-06: 6 mL via OPHTHALMIC

## 2022-10-06 SURGICAL SUPPLY — 19 items
AMNIOGRAFT 3.5X3.5 (Graft) IMPLANT
APPLICATOR COTTON TIP WD 3 STR (MISCELLANEOUS) IMPLANT
BLADE SURG MINI STRL (BLADE) ×1 IMPLANT
BNDG EYE OVAL 2 1/8 X 2 5/8 (GAUZE/BANDAGES/DRESSINGS) ×2 IMPLANT
CORD BIP STRL DISP 12FT (MISCELLANEOUS) IMPLANT
ERASER HMR WETFIELD 18G (MISCELLANEOUS) ×1 IMPLANT
Fibrin Sealant Tissell ×1
GLOVE BIOGEL PI IND STRL 8 (GLOVE) ×1 IMPLANT
GLOVE SURG LX STRL 8.0 MICRO (GLOVE) ×1 IMPLANT
GOWN STRL REUS W/ TWL LRG LVL3 (GOWN DISPOSABLE) ×2 IMPLANT
GOWN STRL REUS W/TWL LRG LVL3 (GOWN DISPOSABLE) ×2
MARKER SKIN DUAL TIP RULER LAB (MISCELLANEOUS) ×1 IMPLANT
NDL RETROBULBAR 25GX1.5 STRL (NEEDLE) IMPLANT
PACK CATARACT (MISCELLANEOUS) ×1 IMPLANT
PROTECTOR CORNEAL (OPHTHALMIC RELATED) ×1 IMPLANT
SEALANT FIBRIN TSSL HEMOSTAT 4 (Miscellaneous) IMPLANT
SUT MERSILENE 4-0 S-2 (SUTURE) IMPLANT
SYR 3ML LL SCALE MARK (SYRINGE) IMPLANT
WATER STERILE IRR 1000ML POUR (IV SOLUTION) ×1 IMPLANT

## 2022-10-06 NOTE — Transfer of Care (Signed)
Immediate Anesthesia Transfer of Care Note  Patient: Gregory Black  Procedure(s) Performed: DOUBLE PTYERGIUM EXCISION  WITH AMNIOGRAFT LEFT SIZE 25 X 25 (Left)  Patient Location: PACU  Anesthesia Type: MAC  Level of Consciousness: awake, alert  and patient cooperative  Airway and Oxygen Therapy: Patient Spontanous Breathing and Patient connected to supplemental oxygen  Post-op Assessment: Post-op Vital signs reviewed, Patient's Cardiovascular Status Stable, Respiratory Function Stable, Patent Airway and No signs of Nausea or vomiting  Post-op Vital Signs: Reviewed and stable  Complications: There were no known notable events for this encounter.

## 2022-10-06 NOTE — H&P (Signed)
[facility]   Primary Care Physician:  Lauro Regulus, MD Ophthalmologist: Dr. Maren Reamer  Pre-Procedure History & Physical: HPI:  Gregory Black is a 77 y.o. male here for cataract surgery.   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 12/06/18: s/p TAVR with a 26 mm Edwards S3U THV via the TF approach   Severe aortic stenosis    Sinus bradycardia    Thoracic ascending aortic aneurysm (HCC)    4.4 cm 11/30/18 CTA   Wears dentures    full upper and lower   Wears hearing aid in both ears     Past Surgical History:  Procedure Laterality Date   CARDIAC CATHETERIZATION     No significant CAD 10/2018   CATARACT EXTRACTION Left 2015   COLONOSCOPY     COLONOSCOPY WITH PROPOFOL N/A 05/17/2015   Procedure: COLONOSCOPY WITH PROPOFOL;  Surgeon: Midge Minium, MD;  Location: [facility];  Service: Endoscopy;  Laterality: N/A;   EYE SURGERY     KNEE ARTHROSCOPY W/ MENISCAL REPAIR Right    RADIOACTIVE SEED IMPLANT N/A 07/26/2017   Procedure: RADIOACTIVE SEED IMPLANT/BRACHYTHERAPY IMPLANT;  Surgeon: Vanna Scotland, MD;  Location: [facility] ORS;  Service: Urology;  Laterality: N/A;   RIGHT/LEFT HEART CATH AND CORONARY ANGIOGRAPHY Bilateral 11/15/2018   Procedure: RIGHT/LEFT HEART CATH AND CORONARY ANGIOGRAPHY;  Surgeon: Dalia Heading, MD;  Location: [facility] INVASIVE CV LAB;  Service: Cardiovascular;  Laterality: Bilateral;   TEE WITHOUT CARDIOVERSION N/A 12/06/2018   Procedure: TRANSESOPHAGEAL ECHOCARDIOGRAM (TEE);  Surgeon: Tonny Bollman, MD;  Location: [facility] OR;  Service: Open Heart Surgery;  Laterality: N/A;   TONSILLECTOMY     TONSILLECTOMY AND ADENOIDECTOMY     TRANSCATHETER AORTIC VALVE REPLACEMENT, TRANSFEMORAL N/A  12/06/2018   Procedure: TRANSCATHETER AORTIC VALVE REPLACEMENT, TRANSFEMORAL approach with insertion of 26mm ultra sapien edwards valve;  Surgeon: Tonny Bollman, MD;  Location: [facility] OR;  Service: Open Heart Surgery;  Laterality: N/A;    Prior to Admission medications   Medication Sig Start Date End Date Taking? Authorizing Provider  aspirin EC 81 MG tablet Take 1 tablet (81 mg total) by mouth daily. 01/05/19  Yes Janetta Hora, PA-C  Multiple Vitamin (MULTIVITAMIN WITH MINERALS) TABS tablet Take 1 tablet by mouth daily.   Yes [provider]  Omega-3 Fatty Acids (FISH OIL) 1000 MG CAPS Take 1,000 mg by mouth daily.   Yes [provider]  rosuvastatin (CRESTOR) 10 MG tablet Take 10 mg by mouth daily.   Yes [provider]  tamsulosin (FLOMAX) 0.4 MG CAPS capsule Take 1 capsule (0.4 mg total) by mouth daily after supper. 09/11/22  Yes Chrystal, Sherrine Maples, MD  vitamin B-12 (CYANOCOBALAMIN) 1000 MCG tablet Take 1,000 mcg by mouth daily.   Yes [provider]  triamterene-hydrochlorothiazide (MAXZIDE-25) 37.5-25 MG tablet Take 1 tablet by mouth daily. Patient not taking: Reported on 09/29/2022 11/29/19   Janetta Hora, PA-C    Allergies as of 09/22/2022 - Review Complete 12/26/2021  Allergen Reaction Noted   Vicodin [hydrocodone-acetaminophen] Other (See Comments) 03/21/2015   Pollen extract Other (See Comments) 06/10/2016    Family History  Problem Relation Age of Onset  Heart disease Mother    Stroke Mother    Diabetes Mother        high blood sugar   Hypertension Mother    Cancer Father    Arthritis Father    Cancer Brother        throat   Diabetes Brother    Lung disease Brother        asbestos    Social History   Socioeconomic History   Marital status: Married    Spouse name: Not on file   Number of children: Not on file   Years of education: Not on file   Highest education level: Not on file  Occupational History   Not on file   Tobacco Use   Smoking status: Former    Current packs/day: 0.00    Types: Cigarettes    Quit date: 12/02/1998    Years since quitting: 23.8   Smokeless tobacco: Never   Tobacco comments:    quit approx 2010  Vaping Use   Vaping status: Never Used  Substance and Sexual Activity   Alcohol use: Yes    Alcohol/week: 1.0 standard drink of alcohol    Types: 1 Cans of beer per week    Comment: 1 beer a week   Drug use: No   Sexual activity: Not on file  Other Topics Concern   Not on file  Social History Narrative   Not on file   Social Determinants of Health   Financial Resource Strain: Low Risk  (09/17/2022)   Received from [facility], [facility]   Overall Financial Resource Strain (CARDIA)    Difficulty of Paying Living Expenses: Not hard at all  Food Insecurity: No Food Insecurity (09/17/2022)   Received from [facility], [facility]   Hunger Vital Sign    Worried About Running Out of Food in the Last Year: Never true    Ran Out of Food in the Last Year: Never true  Transportation Needs: No Transportation Needs (09/17/2022)   Received from [facility], [facility]   [facility] - Transportation    In the past 12 months, has lack of transportation kept you from medical appointments or from getting medications?: No    Lack of Transportation (Non-Medical): No  Physical Activity: Not on file  Stress: Not on file  Social Connections: Not on file  Intimate Partner Violence: Not on file    Review of Systems: See HPI, otherwise negative ROS  Physical Exam: BP (!) 141/104   Temp (!) 97.5 F (36.4 C) (Temporal)   Resp 15   Ht 5\' 8"  (1.727 m)   Wt 85.3 kg   SpO2 98%   BMI 28.60 kg/m  General:   Alert, cooperative in NAD Head:  Normocephalic and atraumatic. Respiratory:  Normal work of breathing. Cardiovascular:  RRR  Impression/Plan: Gregory Black is here for  cataract surgery.  Risks, benefits, limitations, and alternatives regarding cataract surgery have been reviewed with the patient.  Questions have been answered.  All parties agreeable.   Galen Manila, MD  10/06/2022, 11:38 AM

## 2022-10-06 NOTE — Op Note (Signed)
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   POSTOPERATIVE DIAGNOSIS: double pterygium OS left eye.   OPERATIVE PROCEDURE:excision of pterygium x 2 OS with placement of Amniotic membrane graft nasal and temporal   SURGEON:  Galen Manila, MD.   ANESTHESIA:  Anesthesiologist: Marisue Humble, MD CRNA: Lysbeth Penner, CRNA  1.      Managed anesthesia care. 2.     6 cc of a retrobulbar block   COMPLICATIONS:  None.   TECHNIQUE:   sharp and blunt excision of 2 pterygia OS   DESCRIPTION OF PROCEDURE:  The patient was examined and consented in the preoperative holding area where the aforementioned topical anesthesia was applied to the left eye and then brought back to the Operating Room where the left eye received 6cc of retrobulbar block. The eye was prepped and draped in the usual sterile ophthalmic fashion and a lid speculum was placed. Lidocaine with epi was placed beneath both lesions. A 4-0 mersiline traction suture was placed.  The eye was rotated nasally and the temporal lesion was removed by shapr and blunt tech. The defect was measured to be 18x16 mm.  The eye was rotated to the other side and the nasal lesion was removed. The excision bed was measured to be 17 x13 mm. Wet field cautery was used for hemostasis The Graft ws cut into two corresponding sections. Each was placed in the proper orientation and secured with Tisseel glue thrombin followed fibrin. The edges were tucked well under the conj. Ant excess glue was trimmed. A bandage contact lens was placed followed by Maxitrol A gentle pressure patch was placed. Along with a shield. The patient will follow-up with me in one day.   Percocet was prescribed to take prn.    Implant Name Type Inv. Item Serial No. Manufacturer Lot No. LRB No. Used Action  CRYOPRESERVED HUMAN AMNIOTIC MEMBRANE PRODUCT   [id number] BIOTISSUE  Left 1 Implanted    Procedure(s): DOUBLE PTYERGIUM EXCISION  WITH AMNIOGRAFT LEFT  SIZE 25 X 25 (Left)  Electronically signed: Galen Manila 10/06/2022 12:42 PM

## 2022-10-06 NOTE — Anesthesia Postprocedure Evaluation (Signed)
Anesthesia Post Note  Patient: MCLAIN FREER  Procedure(s) Performed: DOUBLE PTYERGIUM EXCISION  WITH AMNIOGRAFT LEFT SIZE 25 X 25 (Left: Eye)  Patient location during evaluation: PACU Anesthesia Type: MAC Level of consciousness: awake and alert Pain management: pain level controlled Vital Signs Assessment: post-procedure vital signs reviewed and stable Respiratory status: spontaneous breathing, nonlabored ventilation, respiratory function stable and patient connected to nasal cannula oxygen Cardiovascular status: stable and blood pressure returned to baseline Postop Assessment: no apparent nausea or vomiting Anesthetic complications: no   There were no known notable events for this encounter.   Last Vitals:  Vitals:   10/06/22 1253 10/06/22 1255  BP:    Pulse: (!) 41 (!) 40  Resp: 12 15  Temp:    SpO2: (!) 87% 98%    Last Pain:  Vitals:   10/06/22 1250  TempSrc:   PainSc: 0-No pain                 Aden Youngman C Chandria Rookstool

## 2022-10-12 ENCOUNTER — Encounter: Payer: Self-pay | Admitting: Ophthalmology

## 2022-10-14 DIAGNOSIS — I10 Essential (primary) hypertension: Secondary | ICD-10-CM | POA: Diagnosis not present

## 2022-10-14 DIAGNOSIS — Z952 Presence of prosthetic heart valve: Secondary | ICD-10-CM | POA: Diagnosis not present

## 2022-10-14 DIAGNOSIS — I2510 Atherosclerotic heart disease of native coronary artery without angina pectoris: Secondary | ICD-10-CM | POA: Diagnosis not present

## 2022-10-14 DIAGNOSIS — I7121 Aneurysm of the ascending aorta, without rupture: Secondary | ICD-10-CM | POA: Diagnosis not present

## 2022-10-14 DIAGNOSIS — R001 Bradycardia, unspecified: Secondary | ICD-10-CM | POA: Diagnosis not present

## 2022-10-14 DIAGNOSIS — I060 Rheumatic aortic stenosis: Secondary | ICD-10-CM | POA: Diagnosis not present

## 2022-10-15 ENCOUNTER — Encounter: Payer: Self-pay | Admitting: Ophthalmology

## 2022-11-03 DIAGNOSIS — R001 Bradycardia, unspecified: Secondary | ICD-10-CM | POA: Diagnosis not present

## 2022-11-04 DIAGNOSIS — I2510 Atherosclerotic heart disease of native coronary artery without angina pectoris: Secondary | ICD-10-CM | POA: Diagnosis not present

## 2022-11-04 DIAGNOSIS — Z952 Presence of prosthetic heart valve: Secondary | ICD-10-CM | POA: Diagnosis not present

## 2022-11-04 DIAGNOSIS — I060 Rheumatic aortic stenosis: Secondary | ICD-10-CM | POA: Diagnosis not present

## 2022-11-04 DIAGNOSIS — R001 Bradycardia, unspecified: Secondary | ICD-10-CM | POA: Diagnosis not present

## 2022-12-07 ENCOUNTER — Other Ambulatory Visit: Payer: Self-pay | Admitting: Radiation Oncology

## 2022-12-22 ENCOUNTER — Encounter: Payer: Self-pay | Admitting: Ophthalmology

## 2022-12-23 NOTE — Anesthesia Preprocedure Evaluation (Addendum)
Anesthesia Evaluation  Patient identified by MRN, date of birth, ID band Patient awake    Reviewed: Allergy & Precautions, H&P , NPO status , Patient's Chart, lab work & pertinent test results  Airway Mallampati: III  TM Distance: >3 FB Neck ROM: Full    Dental no notable dental hx. (+) Edentulous Upper, Edentulous Lower   Pulmonary neg pulmonary ROS, former smoker   Pulmonary exam normal breath sounds clear to auscultation       Cardiovascular negative cardio ROS Normal cardiovascular exam Rhythm:Regular Rate:Normal  ept 2020 note from cardiology office Dr. Einar Crow III: Zio patch that revealed predominately sinus rhythm with a heart rate of 60bpm with occasional PVCs and one 4 beat run of NSVT. There were a few episodes of high-grade heart block, second degree type II, which were both transient in nature. Sinus pauses detected up to 3.6 seconds.   He has occasional episodes of dizziness when heart rate is on the lower side, but these episodes are short in duration. He remains very active, running on a regular basis without significant difficulty.   Echocardiogram on 01/05/2019 revealed normal RV and LV systolic function with an EF estimated between 55 and 60% with post TAVR with 26 mm Sapien valve. Trivial AR persists. The SA view is a bit unusual with an area of dense calcium behind valve near right and left commisure and sinuses are dilated. He has a known ascending thoracic aneurysm, measured at 4.4cm, in which he declined consideration for a SAVR with replacement of the proximal ascending thoracic aorta during the time of TAVR workup.      Neuro/Psych  Neuromuscular disease  negative psych ROS   GI/Hepatic Neg liver ROS, hiatal hernia,GERD  ,,  Endo/Other  negative endocrine ROS    Renal/GU Renal disease  negative genitourinary   Musculoskeletal  (+) Arthritis ,    Abdominal   Peds negative pediatric ROS (+)   Hematology negative hematology ROS (+)   Anesthesia Other Findings Previous surgery 10-06-22  Neuropathy  Bulging lumbar disc Arthritis  Wears dentures History of hiatal hernia Severe aortic stenosis Thoracic ascending aortic aneurysm (HCC)  History of prostate cancer Sinus bradycardia  GERD (gastroesophageal reflux disease) S/P TAVR (transcatheter aortic valve replacement)  Wears hearing aid in both ears Fatty liver  Polyneuropathy    Reproductive/Obstetrics negative OB ROS                              Anesthesia Physical Anesthesia Plan  ASA: 3  Anesthesia Plan: MAC   Post-op Pain Management:    Induction: Intravenous  PONV Risk Score and Plan:   Airway Management Planned: Natural Airway and Nasal Cannula  Additional Equipment:   Intra-op Plan:   Post-operative Plan:   Informed Consent: I have reviewed the patients History and Physical, chart, labs and discussed the procedure including the risks, benefits and alternatives for the proposed anesthesia with the patient or authorized representative who has indicated his/her understanding and acceptance.     Dental Advisory Given  Plan Discussed with: Anesthesiologist, CRNA and Surgeon  Anesthesia Plan Comments: (Patient consented for risks of anesthesia including but not limited to:  - adverse reactions to medications - damage to eyes, teeth, lips or other oral mucosa - nerve damage due to positioning  - sore throat or hoarseness - Damage to heart, brain, nerves, lungs, other parts of body or loss of life  Patient voiced understanding.)  Anesthesia Quick Evaluation

## 2022-12-28 NOTE — Discharge Instructions (Signed)

## 2022-12-29 ENCOUNTER — Ambulatory Visit: Payer: Medicare HMO | Admitting: Anesthesiology

## 2022-12-29 ENCOUNTER — Other Ambulatory Visit: Payer: Self-pay

## 2022-12-29 ENCOUNTER — Ambulatory Visit
Admission: RE | Admit: 2022-12-29 | Discharge: 2022-12-29 | Disposition: A | Discharge status: Home / Self Care | Payer: Medicare HMO | Attending: Ophthalmology | Admitting: Ophthalmology

## 2022-12-29 ENCOUNTER — Encounter
Admission: RE | Disposition: A | Discharge status: Home / Self Care | Payer: Self-pay | Source: Home / Self Care | Attending: Ophthalmology

## 2022-12-29 DIAGNOSIS — K219 Gastro-esophageal reflux disease without esophagitis: Secondary | ICD-10-CM | POA: Insufficient documentation

## 2022-12-29 DIAGNOSIS — K760 Fatty (change of) liver, not elsewhere classified: Secondary | ICD-10-CM | POA: Diagnosis not present

## 2022-12-29 DIAGNOSIS — Z8546 Personal history of malignant neoplasm of prostate: Secondary | ICD-10-CM | POA: Insufficient documentation

## 2022-12-29 DIAGNOSIS — Z79899 Other long term (current) drug therapy: Secondary | ICD-10-CM | POA: Diagnosis not present

## 2022-12-29 DIAGNOSIS — H11021 Central pterygium of right eye: Secondary | ICD-10-CM | POA: Diagnosis not present

## 2022-12-29 DIAGNOSIS — Z7982 Long term (current) use of aspirin: Secondary | ICD-10-CM | POA: Diagnosis not present

## 2022-12-29 DIAGNOSIS — Z87891 Personal history of nicotine dependence: Secondary | ICD-10-CM | POA: Diagnosis not present

## 2022-12-29 DIAGNOSIS — R001 Bradycardia, unspecified: Secondary | ICD-10-CM | POA: Insufficient documentation

## 2022-12-29 DIAGNOSIS — M1990 Unspecified osteoarthritis, unspecified site: Secondary | ICD-10-CM | POA: Insufficient documentation

## 2022-12-29 DIAGNOSIS — K449 Diaphragmatic hernia without obstruction or gangrene: Secondary | ICD-10-CM | POA: Diagnosis not present

## 2022-12-29 DIAGNOSIS — I7121 Aneurysm of the ascending aorta, without rupture: Secondary | ICD-10-CM | POA: Diagnosis not present

## 2022-12-29 DIAGNOSIS — R42 Dizziness and giddiness: Secondary | ICD-10-CM | POA: Insufficient documentation

## 2022-12-29 DIAGNOSIS — Z953 Presence of xenogenic heart valve: Secondary | ICD-10-CM | POA: Insufficient documentation

## 2022-12-29 DIAGNOSIS — H11001 Unspecified pterygium of right eye: Secondary | ICD-10-CM | POA: Diagnosis not present

## 2022-12-29 HISTORY — PX: PTERYGIUM EXCISION: SHX2273

## 2022-12-29 SURGERY — EXCISION, PTERYGIUM
Anesthesia: Monitor Anesthesia Care | Site: Eye | Laterality: Right

## 2022-12-29 MED ORDER — PROPOFOL 10 MG/ML IV BOLUS
INTRAVENOUS | Status: DC | PRN
Start: 2022-12-29 — End: 2022-12-29
  Administered 2022-12-29: 50 mg via INTRAVENOUS

## 2022-12-29 MED ORDER — MOXIFLOXACIN HCL 0.5 % OP SOLN
OPHTHALMIC | Status: DC | PRN
  Administered 2022-12-29: .2 mL via OPHTHALMIC

## 2022-12-29 MED ORDER — TETRACAINE HCL 0.5 % OP SOLN
1.0000 [drp] | OPHTHALMIC | Status: DC | PRN
  Administered 2022-12-29 (×2): 1 [drp] via OPHTHALMIC

## 2022-12-29 MED ORDER — PHENYLEPHRINE HCL 10 % OP SOLN
1.0000 [drp] | Freq: Once | OPHTHALMIC | Status: AC
  Administered 2022-12-29: 1 [drp] via OPHTHALMIC

## 2022-12-29 MED ORDER — PHENYLEPHRINE HCL 10 % OP SOLN
OPHTHALMIC | Status: AC
  Filled 2022-12-29: qty 5

## 2022-12-29 MED ORDER — HYALURONIDASE HUMAN 150 UNIT/ML IJ SOLN
INTRAMUSCULAR | Status: AC
  Filled 2022-12-29: qty 1

## 2022-12-29 MED ORDER — LACTATED RINGERS IV SOLN: INTRAVENOUS | Status: DC

## 2022-12-29 MED ORDER — TETRACAINE HCL 0.5 % OP SOLN
OPHTHALMIC | Status: AC
  Filled 2022-12-29: qty 4

## 2022-12-29 MED ORDER — POVIDONE-IODINE 5 % OP SOLN
OPHTHALMIC | Status: DC | PRN
  Administered 2022-12-29: 1 via OPHTHALMIC

## 2022-12-29 MED ORDER — MIDAZOLAM HCL 2 MG/2ML IJ SOLN
INTRAMUSCULAR | Status: DC | PRN
  Administered 2022-12-29 (×2): 1 mg via INTRAVENOUS

## 2022-12-29 MED ORDER — FENTANYL CITRATE (PF) 100 MCG/2ML IJ SOLN
INTRAMUSCULAR | Status: AC
  Filled 2022-12-29: qty 2

## 2022-12-29 MED ORDER — SODIUM CHLORIDE 0.9% FLUSH: 10.0000 mL | Freq: Two times a day (BID) | INTRAVENOUS | Status: DC

## 2022-12-29 MED ORDER — PROPOFOL 10 MG/ML IV BOLUS
INTRAVENOUS | Status: AC
  Filled 2022-12-29: qty 20

## 2022-12-29 MED ORDER — FENTANYL CITRATE (PF) 100 MCG/2ML IJ SOLN
INTRAMUSCULAR | Status: DC | PRN
  Administered 2022-12-29: 50 ug via INTRAVENOUS

## 2022-12-29 MED ORDER — POLYMYXIN B-TRIMETHOPRIM 10000-0.1 UNIT/ML-% OP SOLN
OPHTHALMIC | Status: DC | PRN
Start: 2022-12-29 — End: 2022-12-29
  Administered 2022-12-29: 1 [drp] via OPHTHALMIC

## 2022-12-29 MED ORDER — BSS IO SOLN
INTRAOCULAR | Status: DC | PRN
  Administered 2022-12-29: 15 mL via INTRAOCULAR

## 2022-12-29 MED ORDER — MIDAZOLAM HCL 2 MG/2ML IJ SOLN
INTRAMUSCULAR | Status: AC
  Filled 2022-12-29: qty 2

## 2022-12-29 MED ORDER — LIDOCAINE HCL (PF) 2 % IJ SOLN
INTRAMUSCULAR | Status: DC | PRN
  Administered 2022-12-29: 5 mL via INTRAMUSCULAR

## 2022-12-29 SURGICAL SUPPLY — 21 items
AMNIOGRAFT 2.0X1.5 (Graft) IMPLANT
APPLICATOR COTTON TIP WD 3 STR (MISCELLANEOUS) ×2 IMPLANT
BLADE MINI RND TIP GREEN BEAV (BLADE) ×1 IMPLANT
BNDG EYE OVAL 2 1/8 X 2 5/8 (GAUZE/BANDAGES/DRESSINGS) ×2 IMPLANT
CORD BIP STRL DISP 12FT (MISCELLANEOUS) ×1 IMPLANT
COVER MAYO STAND STRL (DRAPES) ×1 IMPLANT
ERASER HMR WETFIELD 18G (MISCELLANEOUS) ×1 IMPLANT
GLOVE BIOGEL PI IND STRL 8 (GLOVE) ×1 IMPLANT
GLOVE SURG LX STRL 8.0 MICRO (GLOVE) ×1 IMPLANT
NDL FILTER BLUNT 18X1 1/2 (NEEDLE) ×1 IMPLANT
NDL RETROBULBAR 25GX1.5 STRL (NEEDLE) ×1 IMPLANT
NEEDLE FILTER BLUNT 18X1 1/2 (NEEDLE) ×1
PACK CATARACT (MISCELLANEOUS) ×1 IMPLANT
PROTECTOR CORNEAL (OPHTHALMIC RELATED) ×1 IMPLANT
SEALANT FIBRIN TSSL HEMOSTAT 2 (Miscellaneous) IMPLANT
SPONGE SURG I SPEAR (MISCELLANEOUS) ×2 IMPLANT
SUT MERSILENE 5 0 P 3 (SUTURE) ×1 IMPLANT
SUT VIC AB 4-0 RB1 27 (SUTURE) ×1
SUT VIC AB 4-0 RB1 27X BRD (SUTURE) IMPLANT
SYR 10ML LL (SYRINGE) ×1 IMPLANT
WATER STERILE IRR 500ML POUR (IV SOLUTION) ×1 IMPLANT

## 2022-12-29 NOTE — H&P (Signed)
[facility]   Primary Care Physician:  Lauro Regulus, MD Ophthalmologist: Dr. Druscilla Brownie  Pre-Procedure History & Physical: HPI:  Gregory Black is a 77 y.o. male here for cataract surgery.   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 12/06/18: s/p TAVR with a 26 mm Edwards S3U THV via the TF approach   Severe aortic stenosis    Sinus bradycardia    Thoracic ascending aortic aneurysm (HCC)    4.4 cm 11/30/18 CTA   Wears dentures    full upper and lower   Wears hearing aid in both ears     Past Surgical History:  Procedure Laterality Date   CARDIAC CATHETERIZATION     No significant CAD 10/2018   CATARACT EXTRACTION Left 2015   COLONOSCOPY     COLONOSCOPY WITH PROPOFOL N/A 05/17/2015   Procedure: COLONOSCOPY WITH PROPOFOL;  Surgeon: Midge Minium, MD;  Location: [facility];  Service: Endoscopy;  Laterality: N/A;   EYE SURGERY     KNEE ARTHROSCOPY W/ MENISCAL REPAIR Right    PTERYGIUM EXCISION Left 10/06/2022   Procedure: DOUBLE PTYERGIUM EXCISION  WITH AMNIOGRAFT LEFT SIZE 25 X 25;  Surgeon: Galen Manila, MD;  Location: [facility];  Service: Ophthalmology;  Laterality: Left;   RADIOACTIVE SEED IMPLANT N/A 07/26/2017   Procedure: RADIOACTIVE SEED IMPLANT/BRACHYTHERAPY IMPLANT;  Surgeon: Vanna Scotland, MD;  Location: [facility];  Service: Urology;  Laterality: N/A;   RIGHT/LEFT HEART CATH AND CORONARY ANGIOGRAPHY Bilateral 11/15/2018   Procedure: RIGHT/LEFT HEART CATH AND CORONARY ANGIOGRAPHY;  Surgeon: Dalia Heading, MD;  Location: [facility];  Service: Cardiovascular;  Laterality: Bilateral;   TEE WITHOUT CARDIOVERSION N/A 12/06/2018   Procedure: TRANSESOPHAGEAL ECHOCARDIOGRAM  (TEE);  Surgeon: Tonny Bollman, MD;  Location: [facility] OR;  Service: Open Heart Surgery;  Laterality: N/A;   TONSILLECTOMY     TONSILLECTOMY AND ADENOIDECTOMY     TRANSCATHETER AORTIC VALVE REPLACEMENT, TRANSFEMORAL N/A 12/06/2018   Procedure: TRANSCATHETER AORTIC VALVE REPLACEMENT, TRANSFEMORAL approach with insertion of 26mm ultra sapien edwards valve;  Surgeon: Tonny Bollman, MD;  Location: [facility] OR;  Service: Open Heart Surgery;  Laterality: N/A;    Prior to Admission medications   Medication Sig Start Date End Date Taking? Authorizing Provider  amLODipine (NORVASC) 5 MG tablet Take 5 mg by mouth daily.   Yes [provider]  aspirin EC 81 MG tablet Take 1 tablet (81 mg total) by mouth daily. 01/05/19  Yes Janetta Hora, PA-C  hydrochlorothiazide (HYDRODIURIL) 12.5 MG tablet Take 12.5 mg by mouth daily with supper.   Yes [provider]  Magnesium 250 MG TABS Take by mouth daily.   Yes [provider]  Multiple Vitamin (MULTIVITAMIN WITH MINERALS) TABS tablet Take 1 tablet by mouth daily.   Yes [provider]  Omega-3 Fatty Acids (FISH OIL) 1000 MG CAPS Take 1,000 mg by mouth daily.   Yes [provider]  rosuvastatin (CRESTOR) 10 MG tablet Take 10 mg by mouth daily.   Yes [provider]  tamsulosin (FLOMAX) 0.4 MG CAPS capsule Take 1 capsule (0.4 mg total) by mouth daily after supper. 09/11/22  Yes  Carmina Miller, MD  vitamin B-12 (CYANOCOBALAMIN) 1000 MCG tablet Take 1,000 mcg by mouth daily.   Yes [provider]    Allergies as of 12/11/2022 - Review Complete 10/06/2022  Allergen Reaction Noted   Vicodin [hydrocodone-acetaminophen] Other (See Comments) 03/21/2015   Pollen extract Other (See Comments) 06/10/2016    Family History  Problem Relation Age of Onset   Heart disease Mother    Stroke Mother    Diabetes Mother        high blood sugar   Hypertension Mother    Cancer Father    Arthritis Father    Cancer  Brother        throat   Diabetes Brother    Lung disease Brother        asbestos    Social History   Socioeconomic History   Marital status: Married    Spouse name: Not on file   Number of children: Not on file   Years of education: Not on file   Highest education level: Not on file  Occupational History   Not on file  Tobacco Use   Smoking status: Former    Current packs/day: 0.00    Types: Cigarettes    Quit date: 12/02/1998    Years since quitting: 24.0   Smokeless tobacco: Never   Tobacco comments:    1 cigar each day for about 1 year  Vaping Use   Vaping status: Never Used  Substance and Sexual Activity   Alcohol use: Yes    Alcohol/week: 1.0 standard drink of alcohol    Types: 1 Cans of beer per week    Comment: 1 beer a week   Drug use: No   Sexual activity: Not on file  Other Topics Concern   Not on file  Social History Narrative   Not on file   Social Determinants of Health   Financial Resource Strain: Low Risk  (09/17/2022)   Received from [facility], [facility]   Overall Financial Resource Strain (CARDIA)    Difficulty of Paying Living Expenses: Not hard at all  Food Insecurity: No Food Insecurity (09/17/2022)   Received from [facility], [facility]   Hunger Vital Sign    Worried About Running Out of Food in the Last Year: Never true    Ran Out of Food in the Last Year: Never true  Transportation Needs: No Transportation Needs (09/17/2022)   Received from [facility], [facility]   [facility] - Transportation    In the past 12 months, has lack of transportation kept you from medical appointments or from getting medications?: No    Lack of Transportation (Non-Medical): No  Physical Activity: Not on file  Stress: Not on file  Social Connections: Not on file  Intimate Partner Violence: Not on file    Review of Systems: See HPI, otherwise  negative ROS  Physical Exam: BP (!) 150/75   Pulse (!) 52   Temp 97.8 F (36.6 C) (Temporal)   Resp 12   Ht 5\' 10"  (1.778 m)   Wt 85.3 kg   SpO2 99%   BMI 26.98 kg/m  General:   Alert, cooperative in NAD Head:  Normocephalic and atraumatic. Respiratory:  Normal work of breathing. Cardiovascular:  RRR  Impression/Plan: Gregory Black is here for cataract surgery.  Risks, benefits, limitations, and alternatives regarding cataract surgery have been reviewed with the patient.  Questions have been  answered.  All parties agreeable.   Galen Manila, MD  12/29/2022, 1:20 PM

## 2022-12-29 NOTE — Op Note (Signed)
PREOPERATIVE DIAGNOSIS: pteryigum OD   POSTOPERATIVE DIAGNOSIS:  Pterygium with aminotic graft   OPERATIVE PROCEDURE pterygium excision with amniotic graft   SURGEON:  Galen Manila, MD.   ANESTHESIA:  Anesthesiologist: Marisue Humble, MD CRNA: Domenic Moras, CRNA  1.      Managed anesthesia care. 2.      0.9ml of Shugarcaine was instilled in the eye following the paracentesis. 3.  6cc of the retrobulbar block   COMPLICATIONS:  None.   TECHNIQUE:   sharp and blunt excision with glued graft   DESCRIPTION OF PROCEDURE:  The patient was examined and consented in the preoperative holding area where the aforementioned topical anesthesia was applied to the right eye and then brought back to the Operating Room where the block was placed atraumatically. the right eye was prepped and draped in the usual sterile ophthalmic fashion and a lid speculum was placed. A 4-0 vicryl stay sure was placed ina partial thickness fashion in the superior limbus. The lesion was A corneal shield was placed. The lesion was excised. Homeostsis was achieved with bi-polar cautery The defect was measured to be 13x63mm.  The graft was fashioned to fit with 2mm overlap on each side. The thrombin glue was placed followed by the fibrinogen. The graft was placed and the edges tucked under the conjunctival edges.  The stay suture was removed. The shield was removed. A bandage contact lens was placed followed by Polytrim.The eye was dressed with a gentle pressure pact and shield.  The patient  will follow-up with me in one day. Implant Name Type Inv. Item Serial No. Manufacturer Lot No. LRB No. Used Action  SEALANT FIBRIN TSSL HEMOSTAT 2 - [id number] Miscellaneous SEALANT FIBRIN TSSL HEMOSTAT 2 [id number] [employer] [id number] Right 1 Implanted  AMNIOGRAFT 2.0X1.5 - [id number] Graft AMNIOGRAFT 2.0X1.5 [id number] BIOTISSUE [id number] Right 1 Implanted   Procedure(s): PTERYGIUM EXCISION  WITH AMNIOTIC GRAFT AND GLUE RIGHT (Right)  Electronically signed: Galen Manila 12/29/2022 2:14 PM

## 2022-12-29 NOTE — Transfer of Care (Signed)
Immediate Anesthesia Transfer of Care Note  Patient: Gregory Black  Procedure(s) Performed: PTERYGIUM EXCISION WITH AMNIOTIC GRAFT AND GLUE RIGHT (Right: Eye)  Patient Location: PACU  Anesthesia Type: MAC  Level of Consciousness: awake, alert  and patient cooperative  Airway and Oxygen Therapy: Patient Spontanous Breathing and Patient connected to supplemental oxygen  Post-op Assessment: Post-op Vital signs reviewed, Patient's Cardiovascular Status Stable, Respiratory Function Stable, Patent Airway and No signs of Nausea or vomiting  Post-op Vital Signs: Reviewed and stable  Complications: No notable events documented.

## 2022-12-30 NOTE — Anesthesia Postprocedure Evaluation (Signed)
Anesthesia Post Note  Patient: Gregory Black  Procedure(s) Performed: PTERYGIUM EXCISION WITH AMNIOTIC GRAFT AND GLUE RIGHT (Right: Eye)  Patient location during evaluation: PACU Anesthesia Type: MAC Level of consciousness: awake and alert Pain management: pain level controlled Vital Signs Assessment: post-procedure vital signs reviewed and stable Respiratory status: spontaneous breathing, nonlabored ventilation, respiratory function stable and patient connected to nasal cannula oxygen Cardiovascular status: stable and blood pressure returned to baseline Postop Assessment: no apparent nausea or vomiting Anesthetic complications: no   No notable events documented.   Last Vitals:  Vitals:   12/29/22 1414 12/29/22 1421  BP: (!) 147/75 (!) 141/65  Pulse: (!) 52 (!) 50  Resp: 10 15  Temp: 36.5 C 36.5 C  SpO2: 97% 97%    Last Pain:  Vitals:   12/29/22 1421  TempSrc:   PainSc: 0-No pain                 Sherrye Puga C Nysia Dell

## 2022-12-31 ENCOUNTER — Encounter: Payer: Self-pay | Admitting: Ophthalmology

## 2023-02-09 ENCOUNTER — Other Ambulatory Visit: Payer: Self-pay | Admitting: Radiation Oncology

## 2023-02-16 ENCOUNTER — Other Ambulatory Visit: Payer: Self-pay | Admitting: *Deleted

## 2023-02-16 MED ORDER — TAMSULOSIN HCL 0.4 MG PO CAPS: 0.4000 mg | ORAL_CAPSULE | Freq: Every day | ORAL | 4 refills | Status: AC

## 2023-03-08 DIAGNOSIS — I700 Atherosclerosis of aorta: Secondary | ICD-10-CM | POA: Diagnosis not present

## 2023-03-08 DIAGNOSIS — E538 Deficiency of other specified B group vitamins: Secondary | ICD-10-CM | POA: Diagnosis not present

## 2023-03-08 DIAGNOSIS — I7121 Aneurysm of the ascending aorta, without rupture: Secondary | ICD-10-CM | POA: Diagnosis not present

## 2023-03-08 DIAGNOSIS — I10 Essential (primary) hypertension: Secondary | ICD-10-CM | POA: Diagnosis not present

## 2023-03-08 DIAGNOSIS — I2510 Atherosclerotic heart disease of native coronary artery without angina pectoris: Secondary | ICD-10-CM | POA: Diagnosis not present

## 2023-09-20 ENCOUNTER — Other Ambulatory Visit: Payer: Self-pay | Admitting: Radiation Oncology

## 2023-09-21 ENCOUNTER — Other Ambulatory Visit: Payer: Self-pay | Admitting: Radiation Oncology

## 2023-09-21 DIAGNOSIS — R972 Elevated prostate specific antigen [PSA]: Secondary | ICD-10-CM

## 2023-10-11 ENCOUNTER — Other Ambulatory Visit: Payer: Self-pay | Admitting: Internal Medicine

## 2023-10-11 DIAGNOSIS — I7121 Aneurysm of the ascending aorta, without rupture: Secondary | ICD-10-CM

## 2023-10-11 DIAGNOSIS — Z0000 Encounter for general adult medical examination without abnormal findings: Secondary | ICD-10-CM

## 2023-10-12 ENCOUNTER — Other Ambulatory Visit: Payer: Self-pay | Admitting: Internal Medicine

## 2023-10-12 DIAGNOSIS — R748 Abnormal levels of other serum enzymes: Secondary | ICD-10-CM

## 2023-10-12 DIAGNOSIS — N289 Disorder of kidney and ureter, unspecified: Secondary | ICD-10-CM

## 2023-10-12 DIAGNOSIS — I10 Essential (primary) hypertension: Secondary | ICD-10-CM

## 2023-10-25 ENCOUNTER — Encounter: Payer: Self-pay | Admitting: Ophthalmology

## 2023-10-25 NOTE — Anesthesia Preprocedure Evaluation (Addendum)
 Anesthesia Evaluation  Patient identified by MRN, date of birth, ID band Patient awake    Reviewed: Allergy & Precautions, H&P , NPO status , Patient's Chart, lab work & pertinent test results  Airway Mallampati: III  TM Distance: >3 FB Neck ROM: Full    Dental no notable dental hx. (+) Edentulous Upper, Edentulous Lower   Pulmonary neg pulmonary ROS, shortness of breath, former smoker   Pulmonary exam normal breath sounds clear to auscultation       Cardiovascular hypertension, + CAD  negative cardio ROS Normal cardiovascular exam Rhythm:Regular Rate:Normal  11-29-19 echo 1. Left ventricular ejection fraction, by estimation, is 55 to 60%. The  left ventricle has normal function. The left ventricle has no regional  wall motion abnormalities. Left ventricular diastolic parameters are  consistent with Grade II diastolic  dysfunction (pseudonormalization).   2. Right ventricular systolic function is normal. The right ventricular  size is mildly enlarged.   3. The mitral valve is normal in structure. Trivial mitral valve  regurgitation. No evidence of mitral stenosis.   4. Mild perivalvular leak along mitral valve annular region. The aortic  valve has been repaired/replaced. Aortic valve regurgitation is mild.  There is a 26 mm Sapien prosthetic (TAVR) valve present in the aortic  position. Aortic valve mean gradient  measures 14.2 mmHg. Aortic valve Vmax measures 2.39 m/s.   5. Aortic dilatation noted. There is mild to moderate dilatation of the  ascending aorta, measuring 44 mm.   6. The inferior vena cava is normal in size with greater than 50%  respiratory variability, suggesting right atrial pressure of 3 mmHg.      Neuro/Psych  Neuromuscular disease negative neurological ROS  negative psych ROS   GI/Hepatic negative GI ROS, Neg liver ROS,GERD  ,,  Endo/Other  negative endocrine ROS    Renal/GU Renal diseasenegative  Renal ROS  negative genitourinary   Musculoskeletal negative musculoskeletal ROS (+) Arthritis ,    Abdominal   Peds negative pediatric ROS (+)  Hematology negative hematology ROS (+)   Anesthesia Other Findings Grade II diastolic dysfunction Neuropathy  Bulging lumbar disc Arthritis  Wears dentures Severe aortic stenosis, but had TAVR Thoracic ascending aortic aneurysm (HCC) History of prostate cancer  Sinus bradycardia S/P TAVR (transcatheter aortic valve replacement)  Wears hearing aid in both ears Fatty liver  Polyneuropathy Hypertension B radycardia Dyspnea      Reproductive/Obstetrics negative OB ROS                              Anesthesia Physical Anesthesia Plan  ASA: 3  Anesthesia Plan: MAC   Post-op Pain Management:    Induction: Intravenous  PONV Risk Score and Plan:   Airway Management Planned: Natural Airway and Nasal Cannula  Additional Equipment:   Intra-op Plan:   Post-operative Plan:   Informed Consent: I have reviewed the patients History and Physical, chart, labs and discussed the procedure including the risks, benefits and alternatives for the proposed anesthesia with the patient or authorized representative who has indicated his/her understanding and acceptance.     Dental Advisory Given  Plan Discussed with: Anesthesiologist, CRNA and Surgeon  Anesthesia Plan Comments: (Patient consented for risks of anesthesia including but not limited to:  - adverse reactions to medications - damage to eyes, teeth, lips or other oral mucosa - nerve damage due to positioning  - sore throat or hoarseness - Damage to heart, brain, nerves, lungs, other  parts of body or loss of life  Patient voiced understanding and assent.)        Anesthesia Quick Evaluation

## 2023-10-28 NOTE — Discharge Instructions (Signed)

## 2023-11-02 ENCOUNTER — Encounter
Admission: RE | Disposition: A | Discharge status: Home / Self Care | Payer: Self-pay | Source: Home / Self Care | Attending: Ophthalmology

## 2023-11-02 ENCOUNTER — Ambulatory Visit: Payer: Self-pay | Admitting: Anesthesiology

## 2023-11-02 ENCOUNTER — Ambulatory Visit
Admission: RE | Admit: 2023-11-02 | Discharge: 2023-11-02 | Disposition: A | Discharge status: Home / Self Care | Attending: Ophthalmology | Admitting: Ophthalmology

## 2023-11-02 ENCOUNTER — Encounter: Payer: Self-pay | Admitting: Ophthalmology

## 2023-11-02 ENCOUNTER — Other Ambulatory Visit: Payer: Self-pay

## 2023-11-02 DIAGNOSIS — I129 Hypertensive chronic kidney disease with stage 1 through stage 4 chronic kidney disease, or unspecified chronic kidney disease: Secondary | ICD-10-CM | POA: Diagnosis not present

## 2023-11-02 DIAGNOSIS — K219 Gastro-esophageal reflux disease without esophagitis: Secondary | ICD-10-CM | POA: Diagnosis not present

## 2023-11-02 DIAGNOSIS — H2511 Age-related nuclear cataract, right eye: Secondary | ICD-10-CM | POA: Diagnosis present

## 2023-11-02 DIAGNOSIS — F17290 Nicotine dependence, other tobacco product, uncomplicated: Secondary | ICD-10-CM | POA: Diagnosis not present

## 2023-11-02 DIAGNOSIS — I2510 Atherosclerotic heart disease of native coronary artery without angina pectoris: Secondary | ICD-10-CM | POA: Diagnosis not present

## 2023-11-02 DIAGNOSIS — N1831 Chronic kidney disease, stage 3a: Secondary | ICD-10-CM | POA: Diagnosis not present

## 2023-11-02 HISTORY — DX: Other ill-defined heart diseases: I51.89

## 2023-11-02 HISTORY — DX: Atherosclerosis of aorta: I70.0

## 2023-11-02 HISTORY — DX: Essential (primary) hypertension: I10

## 2023-11-02 HISTORY — DX: Dyspnea, unspecified: R06.00

## 2023-11-02 HISTORY — PX: CATARACT EXTRACTION W/PHACO: SHX586

## 2023-11-02 HISTORY — DX: Chronic kidney disease, stage 3a: N18.31

## 2023-11-02 HISTORY — DX: Bradycardia, unspecified: R00.1

## 2023-11-02 HISTORY — DX: Atherosclerotic heart disease of native coronary artery without angina pectoris: I25.10

## 2023-11-02 SURGERY — PHACOEMULSIFICATION, CATARACT, WITH IOL INSERTION
Anesthesia: Monitor Anesthesia Care | Laterality: Right

## 2023-11-02 MED ORDER — FENTANYL CITRATE (PF) 100 MCG/2ML IJ SOLN
INTRAMUSCULAR | Status: DC | PRN
  Administered 2023-11-02 (×2): 25 ug via INTRAVENOUS

## 2023-11-02 MED ORDER — MIDAZOLAM HCL 2 MG/2ML IJ SOLN
INTRAMUSCULAR | Status: DC | PRN
  Administered 2023-11-02 (×2): 1 mg via INTRAVENOUS

## 2023-11-02 MED ORDER — SIGHTPATH DOSE#1 BSS IO SOLN
INTRAOCULAR | Status: DC | PRN
Start: 2023-11-02 — End: 2023-11-02
  Administered 2023-11-02 (×2): 15 mL via INTRAOCULAR

## 2023-11-02 MED ORDER — GLYCOPYRROLATE 0.2 MG/ML IJ SOLN
INTRAMUSCULAR | Status: DC | PRN
  Administered 2023-11-02 (×2): .2 mg via INTRAVENOUS

## 2023-11-02 MED ORDER — ARMC OPHTHALMIC DILATING DROPS
1.0000 | OPHTHALMIC | Status: DC | PRN
  Administered 2023-11-02 (×6): 1 via OPHTHALMIC

## 2023-11-02 MED ORDER — GLYCOPYRROLATE 0.2 MG/ML IJ SOLN
INTRAMUSCULAR | Status: AC
  Filled 2023-11-02: qty 1

## 2023-11-02 MED ORDER — SIGHTPATH DOSE#1 BSS IO SOLN
INTRAOCULAR | Status: DC | PRN
  Administered 2023-11-02 (×2): 54 mL via OPHTHALMIC

## 2023-11-02 MED ORDER — BRIMONIDINE TARTRATE-TIMOLOL 0.2-0.5 % OP SOLN
OPHTHALMIC | Status: DC | PRN
  Administered 2023-11-02 (×2): 1 [drp] via OPHTHALMIC

## 2023-11-02 MED ORDER — MOXIFLOXACIN HCL 0.5 % OP SOLN
OPHTHALMIC | Status: DC | PRN
  Administered 2023-11-02 (×2): .2 mL via OPHTHALMIC

## 2023-11-02 MED ORDER — LACTATED RINGERS IV SOLN: INTRAVENOUS | Status: DC

## 2023-11-02 MED ORDER — TETRACAINE HCL 0.5 % OP SOLN
OPHTHALMIC | Status: AC
Start: 2023-11-02 — End: 2023-11-02
  Filled 2023-11-02: qty 4

## 2023-11-02 MED ORDER — FENTANYL CITRATE (PF) 100 MCG/2ML IJ SOLN
INTRAMUSCULAR | Status: AC
Start: 2023-11-02 — End: 2023-11-02
  Filled 2023-11-02: qty 2

## 2023-11-02 MED ORDER — LIDOCAINE HCL (PF) 2 % IJ SOLN
INTRAOCULAR | Status: DC | PRN
  Administered 2023-11-02 (×2): 1 mL

## 2023-11-02 MED ORDER — SIGHTPATH DOSE#1 NA CHONDROIT SULF-NA HYALURON 40-17 MG/ML IO SOLN
INTRAOCULAR | Status: DC | PRN
  Administered 2023-11-02 (×2): 1 mL via INTRAOCULAR

## 2023-11-02 MED ORDER — TETRACAINE HCL 0.5 % OP SOLN
1.0000 [drp] | OPHTHALMIC | Status: DC | PRN
  Administered 2023-11-02 (×6): 1 [drp] via OPHTHALMIC

## 2023-11-02 MED ORDER — MIDAZOLAM HCL 2 MG/2ML IJ SOLN
INTRAMUSCULAR | Status: AC
  Filled 2023-11-02: qty 2

## 2023-11-02 MED ORDER — ARMC OPHTHALMIC DILATING DROPS
OPHTHALMIC | Status: AC
  Filled 2023-11-02: qty 0.5

## 2023-11-02 SURGICAL SUPPLY — 10 items
CYSTOTOME ANGL RVRS SHRT 25G (CUTTER) ×1 IMPLANT
FEE CATARACT SUITE SIGHTPATH (MISCELLANEOUS) ×1 IMPLANT
GLOVE BIOGEL PI IND STRL 8 (GLOVE) ×1 IMPLANT
GLOVE SURG LX STRL 8.0 MICRO (GLOVE) ×1 IMPLANT
GLOVE SURG SYN 6.5 PF PI BL (GLOVE) ×1 IMPLANT
LENS IOL TECNIS EYHANCE 19.5 (Intraocular Lens) IMPLANT
NDL FILTER BLUNT 18X1 1/2 (NEEDLE) ×1 IMPLANT
NEEDLE FILTER BLUNT 18X1 1/2 (NEEDLE) ×1 IMPLANT
SUT NYLON 10-0 (SUTURE) IMPLANT
SYR 3ML LL SCALE MARK (SYRINGE) ×1 IMPLANT

## 2023-11-02 NOTE — H&P (Signed)
 [facility]   Primary Care Physician:  Lenon Layman ORN, MD Ophthalmologist: Dr. Jaye  Pre-Procedure History & Physical: HPI:  Gregory Black is a 79 y.o. male here for cataract surgery.   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 12/06/18: s/p TAVR with a 26 mm Edwards S3U THV via the TF approach   Severe aortic stenosis    Sinus bradycardia    Stage 3a chronic kidney disease (CKD) (HCC)    Thoracic ascending aortic aneurysm (HCC)    4.4 cm 11/30/18 CTA   Wears dentures    full upper and lower   Wears hearing aid in both ears     Past Surgical History:  Procedure Laterality Date   CARDIAC CATHETERIZATION     No significant CAD 10/2018   CATARACT EXTRACTION Left 2015   COLONOSCOPY     COLONOSCOPY WITH PROPOFOL  N/A 05/17/2015   Procedure: COLONOSCOPY WITH PROPOFOL ;  Surgeon: Rogelia Copping, MD;  Location: [facility];  Service: Endoscopy;  Laterality: N/A;   EYE SURGERY     KNEE ARTHROSCOPY W/ MENISCAL REPAIR Right    PTERYGIUM EXCISION Left 10/06/2022   Procedure: DOUBLE PTYERGIUM EXCISION  WITH AMNIOGRAFT LEFT SIZE 25 X 25;  Surgeon: Jaye Fallow, MD;  Location: [facility];  Service: Ophthalmology;  Laterality: Left;   PTERYGIUM EXCISION Right 12/29/2022   Procedure: PTERYGIUM EXCISION WITH AMNIOTIC GRAFT AND GLUE RIGHT;  Surgeon: Jaye Fallow, MD;  Location: [facility];  Service: Ophthalmology;  Laterality: Right;   RADIOACTIVE SEED IMPLANT N/A 07/26/2017   Procedure: RADIOACTIVE SEED  IMPLANT/BRACHYTHERAPY IMPLANT;  Surgeon: Penne Knee, MD;  Location: [facility];  Service: Urology;  Laterality: N/A;   RIGHT/LEFT HEART CATH AND CORONARY ANGIOGRAPHY Bilateral 11/15/2018   Procedure: RIGHT/LEFT HEART CATH AND CORONARY ANGIOGRAPHY;  Surgeon: Bosie Vinie LABOR, MD;  Location: [facility];  Service: Cardiovascular;  Laterality: Bilateral;   TEE WITHOUT CARDIOVERSION N/A 12/06/2018   Procedure: TRANSESOPHAGEAL ECHOCARDIOGRAM (TEE);  Surgeon: Wonda Sharper, MD;  Location: [facility] OR;  Service: Open Heart Surgery;  Laterality: N/A;   TONSILLECTOMY     TONSILLECTOMY AND ADENOIDECTOMY     TRANSCATHETER AORTIC VALVE REPLACEMENT, TRANSFEMORAL N/A 12/06/2018   Procedure: TRANSCATHETER AORTIC VALVE REPLACEMENT, TRANSFEMORAL approach with insertion of 26mm ultra sapien edwards valve;  Surgeon: Wonda Sharper, MD;  Location: [facility] OR;  Service: Open Heart Surgery;  Laterality: N/A;    Prior to Admission medications   Medication Sig Start Date End Date Taking? Authorizing Provider  amLODipine (NORVASC) 5 MG tablet Take 5 mg by mouth daily.   Yes [provider]  aspirin  EC 81 MG tablet Take 1 tablet (81 mg total) by mouth daily. 01/05/19  Yes Sebastian Lamarr SAUNDERS, PA-C  hydrochlorothiazide (HYDRODIURIL) 12.5 MG tablet Take 12.5 mg by mouth daily with supper.   Yes [provider]  Magnesium  250 MG TABS Take by mouth daily.   Yes [provider]  Multiple Vitamin (  MULTIVITAMIN WITH MINERALS) TABS tablet Take 1 tablet by mouth daily.   Yes [provider]  Omega-3 Fatty Acids (FISH OIL) 1000 MG CAPS Take 1,000 mg by mouth daily.   Yes [provider]  rosuvastatin (CRESTOR) 10 MG tablet Take 10 mg by mouth daily.   Yes [provider]  tamsulosin  (FLOMAX ) 0.4 MG CAPS capsule Take 1 capsule (0.4 mg total) by mouth daily after supper. 09/11/22  Yes Chrystal, Marcey, MD  tamsulosin  (FLOMAX ) 0.4 MG CAPS capsule Take 1 capsule (0.4 mg total) by  mouth daily after supper. 02/16/23  Yes Chrystal, Marcey, MD  vitamin B-12 (CYANOCOBALAMIN ) 1000 MCG tablet Take 1,000 mcg by mouth daily.   Yes [provider]    Allergies as of 10/13/2023 - Review Complete 12/29/2022  Allergen Reaction Noted   Vicodin [hydrocodone -acetaminophen ] Other (See Comments) 03/21/2015   Pollen extract Other (See Comments) 06/10/2016    Family History  Problem Relation Age of Onset   Heart disease Mother    Stroke Mother    Diabetes Mother        high blood sugar   Hypertension Mother    Cancer Father    Arthritis Father    Cancer Brother        throat   Diabetes Brother    Lung disease Brother        asbestos    Social History   Socioeconomic History   Marital status: Married    Spouse name: Not on file   Number of children: Not on file   Years of education: Not on file   Highest education level: Not on file  Occupational History   Not on file  Tobacco Use   Smoking status: Former    Current packs/day: 0.00    Types: Cigarettes    Quit date: 12/02/1998    Years since quitting: 24.9   Smokeless tobacco: Never   Tobacco comments:    1 cigar each day for about 1 year  Vaping Use   Vaping status: Never Used  Substance and Sexual Activity   Alcohol  use: Never   Drug use: No   Sexual activity: Not on file  Other Topics Concern   Not on file  Social History Narrative   Not on file   Social Drivers of Health   Financial Resource Strain: Low Risk  (10/06/2023)   Received from [facility]   Overall Financial Resource Strain (CARDIA)    Difficulty of Paying Living Expenses: Not very hard  Food Insecurity: No Food Insecurity (10/06/2023)   Received from [facility]   Hunger Vital Sign    Within the past 12 months, you worried that your food would run out before you got the money to buy more.: Never true    Within the past 12 months, the food you bought just didn't last and you didn't have money  to get more.: Never true  Transportation Needs: No Transportation Needs (10/06/2023)   Received from [facility] - Transportation    In the past 12 months, has lack of transportation kept you from medical appointments or from getting medications?: No    Lack of Transportation (Non-Medical): No  Physical Activity: Not on file  Stress: Not on file  Social Connections: Not on file  Intimate Partner Violence: Not on file    Review of Systems: See HPI, otherwise negative ROS  Physical Exam: BP (!) 147/59   Temp 98 F (  36.7 C) (Temporal)   Resp 11   Ht 5' 7.99 (1.727 m)   Wt 87 kg   SpO2 99%   BMI 29.19 kg/m  General:   Alert, cooperative. Head:  Normocephalic and atraumatic. Respiratory:  Normal work of breathing. Cardiovascular:  NAD  Impression/Plan: Gregory Black is here for cataract surgery.  Risks, benefits, limitations, and alternatives regarding cataract surgery have been reviewed with the patient.  Questions have been answered.  All parties agreeable.   Elsie Carmine, MD  11/02/2023, 8:49 AM

## 2023-11-02 NOTE — Anesthesia Postprocedure Evaluation (Signed)
 Anesthesia Post Note  Patient: Gregory Black  Procedure(s) Performed: PHACOEMULSIFICATION, CATARACT, WITH IOL INSERTION 9.58, 00:59.2 (Right)  Patient location during evaluation: PACU Anesthesia Type: MAC Level of consciousness: awake and alert Pain management: pain level controlled Vital Signs Assessment: post-procedure vital signs reviewed and stable Respiratory status: spontaneous breathing, nonlabored ventilation, respiratory function stable and patient connected to nasal cannula oxygen Cardiovascular status: stable and blood pressure returned to baseline Postop Assessment: no apparent nausea or vomiting Anesthetic complications: no   No notable events documented.   Last Vitals:  Vitals:   11/02/23 0929 11/02/23 0933  BP: 124/75   Pulse: (!) 54 (!) 54  Resp: 18 20  Temp: 36.6 C 36.6 C  SpO2: 100% 98%    Last Pain:  Vitals:   11/02/23 0933  TempSrc:   PainSc: 0-No pain                 [name]

## 2023-11-02 NOTE — Op Note (Signed)
 PREOPERATIVE DIAGNOSIS:  Nuclear sclerotic cataract of the right eye.   POSTOPERATIVE DIAGNOSIS:  Right Cataract Extraction   OPERATIVE PROCEDURE:ORPROCALL@   SURGEON:  Elsie Carmine, MD.   ANESTHESIA:  Anesthesiologist: Ola Donny BROCKS, MD CRNA: Elly Pfeiffer, CRNA; Dave Maus, CRNA  1.      Managed anesthesia care. 2.      0.53ml of Shugarcaine was instilled in the eye following the paracentesis.   COMPLICATIONS:  None. A 10-0 suture was placed in the main incision due to the floppiness of the iris.   TECHNIQUE:   Stop and chop   DESCRIPTION OF PROCEDURE:  The patient was examined and consented in the preoperative holding area where the aforementioned topical anesthesia was applied to the right eye and then brought back to the Operating Room where the right eye was prepped and draped in the usual sterile ophthalmic fashion and a lid speculum was placed. A paracentesis was created with the side port blade and the anterior chamber was filled with viscoelastic. A near clear corneal incision was performed with the steel keratome. A continuous curvilinear capsulorrhexis was performed with a cystotome followed by the capsulorrhexis forceps. Hydrodissection and hydrodelineation were carried out with BSS on a blunt cannula. The lens was removed in a stop and chop  technique and the remaining cortical material was removed with the irrigation-aspiration handpiece. The capsular bag was inflated with viscoelastic and the Technis [id number]  lens was placed in the capsular bag without complication. The remaining viscoelastic was removed from the eye with the irrigation-aspiration handpiece. The wounds were hydrated. The anterior chamber was flushed with BSS and the eye was inflated to physiologic pressure. 0.1ml of Vigamox  was placed in the anterior chamber. The wounds were found to be water  tight. The eye was dressed with Combigan . The patient was given protective glasses to wear throughout the  day and a shield with which to sleep tonight. The patient was also given drops with which to begin a drop regimen today and will follow-up with me in one day. Implant Name Type Inv. Item Serial No. Manufacturer Lot No. LRB No. Used Action  LENS IOL TECNIS EYHANCE 19.5 - [id number] Intraocular Lens LENS IOL TECNIS EYHANCE 19.5 [id number] [employer]  Right 1 Implanted   Procedure(s): PHACOEMULSIFICATION, CATARACT, WITH IOL INSERTION 9.58, 00:59.2 (Right)  Electronically signed: Elsie Carmine 11/02/2023 9:27 AM

## 2023-11-02 NOTE — Transfer of Care (Signed)
 Immediate Anesthesia Transfer of Care Note  Patient: Gregory Black  Procedure(s) Performed: PHACOEMULSIFICATION, CATARACT, WITH IOL INSERTION 9.58, 00:59.2 (Right)  Patient Location: PACU  Anesthesia Type: MAC  Level of Consciousness: awake, alert  and patient cooperative  Airway and Oxygen Therapy: Patient Spontanous Breathing and Patient connected to supplemental oxygen  Post-op Assessment: Post-op Vital signs reviewed, Patient's Cardiovascular Status Stable, Respiratory Function Stable, Patent Airway and No signs of Nausea or vomiting  Post-op Vital Signs: Reviewed and stable  Complications: No notable events documented.

## 2023-11-16 ENCOUNTER — Ambulatory Visit
Admission: RE | Admit: 2023-11-16 | Discharge: 2023-11-16 | Disposition: A | Discharge status: Home / Self Care | Source: Ambulatory Visit | Attending: Internal Medicine | Admitting: Internal Medicine

## 2023-11-16 DIAGNOSIS — R748 Abnormal levels of other serum enzymes: Secondary | ICD-10-CM | POA: Insufficient documentation

## 2023-11-16 DIAGNOSIS — N289 Disorder of kidney and ureter, unspecified: Secondary | ICD-10-CM | POA: Insufficient documentation

## 2023-11-16 DIAGNOSIS — I10 Essential (primary) hypertension: Secondary | ICD-10-CM | POA: Diagnosis present

## 2024-04-26 ENCOUNTER — Other Ambulatory Visit: Payer: Self-pay | Admitting: Radiation Oncology
